# Patient Record
Sex: Male | Born: 1945 | Race: White | Hispanic: No | Marital: Married | Smoking: Former smoker
Health system: Southern US, Community
[De-identification: ages and names within clinical notes are randomized; demographics above are authoritative.]

## PROBLEM LIST (undated history)

## (undated) DIAGNOSIS — C4490 Unspecified malignant neoplasm of skin, unspecified: Secondary | ICD-10-CM

## (undated) DIAGNOSIS — Z923 Personal history of irradiation: Secondary | ICD-10-CM

## (undated) DIAGNOSIS — I2699 Other pulmonary embolism without acute cor pulmonale: Secondary | ICD-10-CM

## (undated) DIAGNOSIS — A770 Spotted fever due to Rickettsia rickettsii: Secondary | ICD-10-CM

## (undated) DIAGNOSIS — C801 Malignant (primary) neoplasm, unspecified: Secondary | ICD-10-CM

## (undated) DIAGNOSIS — C61 Malignant neoplasm of prostate: Secondary | ICD-10-CM

## (undated) DIAGNOSIS — D689 Coagulation defect, unspecified: Secondary | ICD-10-CM

## (undated) DIAGNOSIS — K219 Gastro-esophageal reflux disease without esophagitis: Secondary | ICD-10-CM

## (undated) DIAGNOSIS — H409 Unspecified glaucoma: Secondary | ICD-10-CM

## (undated) DIAGNOSIS — J45909 Unspecified asthma, uncomplicated: Secondary | ICD-10-CM

## (undated) DIAGNOSIS — J189 Pneumonia, unspecified organism: Secondary | ICD-10-CM

## (undated) HISTORY — DX: Malignant (primary) neoplasm, unspecified: C80.1

## (undated) HISTORY — DX: Personal history of irradiation: Z92.3

## (undated) HISTORY — DX: Unspecified glaucoma: H40.9

## (undated) HISTORY — DX: Gastro-esophageal reflux disease without esophagitis: K21.9

## (undated) HISTORY — DX: Coagulation defect, unspecified: D68.9

---

## 1998-10-19 HISTORY — PX: MELANOMA EXCISION: SHX5266

## 1998-12-10 ENCOUNTER — Emergency Department (HOSPITAL_COMMUNITY): Admission: EM | Admit: 1998-12-10 | Discharge: 1998-12-10 | Payer: Self-pay | Admitting: Emergency Medicine

## 1998-12-10 ENCOUNTER — Encounter: Payer: Self-pay | Admitting: *Deleted

## 2001-01-24 ENCOUNTER — Emergency Department (HOSPITAL_COMMUNITY): Admission: EM | Admit: 2001-01-24 | Discharge: 2001-01-24 | Payer: Self-pay | Admitting: Emergency Medicine

## 2002-08-07 ENCOUNTER — Ambulatory Visit (HOSPITAL_BASED_OUTPATIENT_CLINIC_OR_DEPARTMENT_OTHER): Admission: RE | Admit: 2002-08-07 | Discharge: 2002-08-07 | Payer: Self-pay | Admitting: General Surgery

## 2002-08-07 ENCOUNTER — Encounter (INDEPENDENT_AMBULATORY_CARE_PROVIDER_SITE_OTHER): Payer: Self-pay | Admitting: Specialist

## 2003-04-23 ENCOUNTER — Emergency Department (HOSPITAL_COMMUNITY): Admission: EM | Admit: 2003-04-23 | Discharge: 2003-04-23 | Payer: Self-pay | Admitting: Emergency Medicine

## 2004-02-06 ENCOUNTER — Inpatient Hospital Stay (HOSPITAL_COMMUNITY): Admission: EM | Admit: 2004-02-06 | Discharge: 2004-02-09 | Payer: Self-pay | Admitting: Emergency Medicine

## 2004-08-19 ENCOUNTER — Ambulatory Visit: Payer: Self-pay | Admitting: Family Medicine

## 2004-09-02 ENCOUNTER — Ambulatory Visit: Payer: Self-pay | Admitting: Family Medicine

## 2004-10-19 DIAGNOSIS — A770 Spotted fever due to Rickettsia rickettsii: Secondary | ICD-10-CM

## 2004-10-19 HISTORY — DX: Spotted fever due to Rickettsia rickettsii: A77.0

## 2008-08-01 ENCOUNTER — Ambulatory Visit: Admission: RE | Admit: 2008-08-01 | Discharge: 2008-09-05 | Payer: Self-pay | Admitting: Radiation Oncology

## 2008-08-02 ENCOUNTER — Encounter: Payer: Self-pay | Admitting: Internal Medicine

## 2008-10-19 HISTORY — PX: KNEE ARTHROSCOPY: SUR90

## 2009-01-28 HISTORY — PX: PROSTATE BIOPSY: SHX241

## 2009-07-22 ENCOUNTER — Encounter: Admission: RE | Admit: 2009-07-22 | Discharge: 2009-07-22 | Payer: Self-pay | Admitting: Sports Medicine

## 2009-10-19 DIAGNOSIS — C61 Malignant neoplasm of prostate: Secondary | ICD-10-CM

## 2009-10-19 HISTORY — DX: Malignant neoplasm of prostate: C61

## 2009-11-08 HISTORY — PX: PROSTATECTOMY: SHX69

## 2009-12-03 ENCOUNTER — Encounter (INDEPENDENT_AMBULATORY_CARE_PROVIDER_SITE_OTHER): Payer: Self-pay | Admitting: *Deleted

## 2010-01-16 ENCOUNTER — Encounter (INDEPENDENT_AMBULATORY_CARE_PROVIDER_SITE_OTHER): Payer: Self-pay | Admitting: *Deleted

## 2010-01-20 ENCOUNTER — Ambulatory Visit: Payer: Self-pay | Admitting: Internal Medicine

## 2010-02-03 ENCOUNTER — Ambulatory Visit: Payer: Self-pay | Admitting: Internal Medicine

## 2010-02-05 ENCOUNTER — Encounter: Payer: Self-pay | Admitting: Internal Medicine

## 2010-04-15 ENCOUNTER — Emergency Department (HOSPITAL_COMMUNITY): Admission: EM | Admit: 2010-04-15 | Discharge: 2010-04-16 | Payer: Self-pay | Admitting: Emergency Medicine

## 2010-04-25 ENCOUNTER — Ambulatory Visit: Payer: Self-pay | Admitting: Family Medicine

## 2010-04-25 DIAGNOSIS — Z8709 Personal history of other diseases of the respiratory system: Secondary | ICD-10-CM | POA: Insufficient documentation

## 2010-04-25 DIAGNOSIS — K219 Gastro-esophageal reflux disease without esophagitis: Secondary | ICD-10-CM | POA: Insufficient documentation

## 2010-04-25 DIAGNOSIS — Z86718 Personal history of other venous thrombosis and embolism: Secondary | ICD-10-CM | POA: Insufficient documentation

## 2010-05-01 ENCOUNTER — Ambulatory Visit: Payer: Self-pay | Admitting: Cardiology

## 2010-05-01 DIAGNOSIS — R0989 Other specified symptoms and signs involving the circulatory and respiratory systems: Secondary | ICD-10-CM

## 2010-05-01 DIAGNOSIS — R0609 Other forms of dyspnea: Secondary | ICD-10-CM | POA: Insufficient documentation

## 2010-06-02 ENCOUNTER — Ambulatory Visit: Payer: Self-pay

## 2010-06-02 ENCOUNTER — Ambulatory Visit: Payer: Self-pay | Admitting: Cardiology

## 2010-06-02 ENCOUNTER — Ambulatory Visit: Payer: Self-pay | Admitting: Family Medicine

## 2010-06-03 LAB — CONVERTED CEMR LAB
Cholesterol: 180 mg/dL (ref 0–200)
HDL: 40.1 mg/dL (ref >39.00)
LDL Cholesterol: 105 mg/dL — ABNORMAL HIGH (ref 0–99)
Triglycerides: 175 mg/dL — ABNORMAL HIGH (ref 0.0–149.0)
VLDL: 35 mg/dL (ref 0.0–40.0)

## 2010-07-01 ENCOUNTER — Ambulatory Visit: Payer: Self-pay | Admitting: Internal Medicine

## 2010-07-01 ENCOUNTER — Ambulatory Visit: Payer: Self-pay | Admitting: Family Medicine

## 2010-07-01 ENCOUNTER — Inpatient Hospital Stay (HOSPITAL_COMMUNITY): Admission: EM | Admit: 2010-07-01 | Discharge: 2010-07-05 | Payer: Self-pay | Admitting: Emergency Medicine

## 2010-07-01 DIAGNOSIS — R079 Chest pain, unspecified: Secondary | ICD-10-CM

## 2010-07-02 ENCOUNTER — Ambulatory Visit: Payer: Self-pay | Admitting: Vascular Surgery

## 2010-07-02 ENCOUNTER — Encounter (INDEPENDENT_AMBULATORY_CARE_PROVIDER_SITE_OTHER): Payer: Self-pay | Admitting: Internal Medicine

## 2010-07-03 ENCOUNTER — Encounter (INDEPENDENT_AMBULATORY_CARE_PROVIDER_SITE_OTHER): Payer: Self-pay | Admitting: Internal Medicine

## 2010-07-07 ENCOUNTER — Ambulatory Visit: Payer: Self-pay | Admitting: Family Medicine

## 2010-07-07 ENCOUNTER — Telehealth: Payer: Self-pay | Admitting: Family Medicine

## 2010-07-07 LAB — CONVERTED CEMR LAB: INR: 2.8

## 2010-07-14 ENCOUNTER — Ambulatory Visit: Payer: Self-pay | Admitting: Family Medicine

## 2010-07-21 ENCOUNTER — Ambulatory Visit: Payer: Self-pay | Admitting: Family Medicine

## 2010-07-21 LAB — CONVERTED CEMR LAB: INR: 1.9

## 2010-07-30 ENCOUNTER — Ambulatory Visit: Payer: Self-pay | Admitting: Family Medicine

## 2010-08-25 ENCOUNTER — Telehealth: Payer: Self-pay | Admitting: Family Medicine

## 2010-08-25 ENCOUNTER — Ambulatory Visit: Payer: Self-pay | Admitting: Family Medicine

## 2010-08-25 LAB — CONVERTED CEMR LAB: INR: 2

## 2010-08-26 ENCOUNTER — Telehealth: Payer: Self-pay | Admitting: Family Medicine

## 2010-09-24 ENCOUNTER — Ambulatory Visit: Payer: Self-pay | Admitting: Family Medicine

## 2010-09-24 LAB — CONVERTED CEMR LAB

## 2010-10-19 DIAGNOSIS — J189 Pneumonia, unspecified organism: Secondary | ICD-10-CM

## 2010-10-19 HISTORY — PX: INGUINAL HERNIA REPAIR: SUR1180

## 2010-10-19 HISTORY — DX: Pneumonia, unspecified organism: J18.9

## 2010-10-29 ENCOUNTER — Ambulatory Visit
Admission: RE | Admit: 2010-10-29 | Discharge: 2010-10-29 | Payer: Self-pay | Source: Home / Self Care | Attending: Family Medicine | Admitting: Family Medicine

## 2010-10-29 LAB — CONVERTED CEMR LAB: INR: 2.5

## 2010-11-18 NOTE — Procedures (Signed)
Summary: Colonoscopy  Patient: [name] Note: All result statuses are Final unless otherwise noted.  Tests: (1) Colonoscopy (COL)   COL Colonoscopy           DONE     [facility]     [address]     [place], [place]  [address]           COLONOSCOPY PROCEDURE REPORT           PATIENT:  Donald Villegas, Donald Villegas  MR#:  [id number]     BIRTHDATE:  1946-06-29, 64 yrs. old  GENDER:  male     ENDOSCOPIST:  Wilhemina Bonito. Eda Keys, MD     REF. BY:  Screening Recall     PROCEDURE DATE:  02/03/2010     PROCEDURE:  Colonoscopy with snare polypectomy x 2     ASA CLASS:  Class II     INDICATIONS:  Routine Risk Screening     MEDICATIONS:   Fentanyl 75 mcg IV, Versed 9 mg IV           DESCRIPTION OF PROCEDURE:   After the risks benefits and     alternatives of the procedure were thoroughly explained, informed     consent was obtained.  Digital rectal exam was performed and     revealed no abnormalities.   The LB [id number] endoscope     was introduced through the anus and advanced to the cecum, which     was identified by both the appendix and ileocecal valve, without     limitations.Time to cecum= 4:01 min. The quality of the prep was     excellent, using MoviPrep.  The instrument was then slowly     withdrawn (time = 10:15 min) as the colon was fully examined.     <<PROCEDUREIMAGES>>           FINDINGS:  Two polyps were found - 2mm in the ascending colon and     2mm in rectum.  This was otherwise a normal examination of the     colon.   Retroflexed views in the rectum revealed internal     hemorrhoids.    The scope was then withdrawn from the patient and     the procedure completed.           COMPLICATIONS:  None     ENDOSCOPIC IMPRESSION:     1) Two polyps - removed     2) Otherwise normal examination     3) Internal hemorrhoids           RECOMMENDATIONS:     1) Repeat colonoscopy in 5 years if polyp adenomatous; otherwise     10 years           ______________________________     Wilhemina Bonito. Eda Keys, MD           CC:  The Patient           n.     eSIGNED:   Wilhemina Bonito. Eda Keys at 02/03/2010 10:22 AM           Wilber Bihari, [id number]  Note: An exclamation mark (!) indicates a result that was not dispersed into the flowsheet. Document Creation Date: 02/03/2010 10:23 AM _______________________________________________________________________  (1) Order result status: Final Collection or observation date-time: 02/03/2010 10:13 Requested date-time:  Receipt date-time:  Reported date-time:  Referring Physician:   Ordering Physician: Fransico Setters [phone]) Specimen Source:  Source: Launa Grill Order Number: [id number]  Lab site:   Appended Document: Colonoscopy recall     Procedures Next Due Date:    Colonoscopy: 01/2015

## 2010-11-18 NOTE — Assessment & Plan Note (Signed)
Summary: pneumonia/dm   Vital Signs:  Patient profile:   65 year old male Weight:      185 pounds O2 Sat:      93 % on Room air Temp:     99.4 degrees F oral Pulse rate:   70 / minute BP sitting:   132 / 88  (left arm) Cuff size:   regular  Vitals Entered By: Kern Reap CMA Duncan Dull) (July 01, 2010 4:43 PM)  O2 Flow:  Room air CC: possible pneumonia Is Patient Diabetic? No Pain Assessment Patient in pain? yes        Primary Care Provider:  Dr. Tawanna Cooler  CC:  possible pneumonia.  History of Present Illness: Donald Villegas  is a 65 year old male, who comes in today withfor evaluation of acute right-sided chest pain.  He states he felt fine until 1 p.m. today when he developed the sudden onset of right-sided chest pain.  He points to the right upper ribs as a source of his pain and he says it radiates up into his scapula.  He ate at 1030.  No history of any gallbladder problems in the past.  Is not having vomiting, diarrhea, et Karie Soda.  He had a history of pneumonia and had a slowly resolving.  Chest x-ray last summer.  He feels his symptoms are consistent with a pneumonia.  He had last summer  Allergies: No Known Drug Allergies  Past History:  Past medical, surgical, family and social histories (including risk factors) reviewed for relevance to current acute and chronic problems.  Past Medical History: Reviewed history from 05/01/2010 and no changes required. 1.  Pneumonia 6/11 2.  Pulmonary embolism: 2005, had prior left leg deep laceration, ? resulting in DVT.  Was on coumadin for a period, now off anticoagulation.  3.  melanoma, hx of 4.  GERD 5.  Prostate Cancer: Had Da Vinci surgery in 1/11.  6.  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  Scattered coronary calcifications on CTA chest from 6/11.   Past Surgical History: Reviewed history from 04/25/2010 and no changes required. Tonsillectomy prostate surgery left knee surgery  Family  History: Reviewed history from 05/01/2010 and no changes required. Father: deceased - staph infection Mother: deceased - ALS Siblings: 1 brother deceased - cancer Children: 2 - healthy No premature CAD.   Social History: Reviewed history from 05/01/2010 and no changes required. Occupation: [employer] Married, lives in [place].  Alcohol use-yes Drug use-no Nonsmoker  Review of Systems      See HPI  Physical Exam  General:  Well-developed,well-nourished,in no acute distress; alert,appropriate and cooperative throughout examination Neck:  No deformities, masses, or tenderness noted. Chest Wall:  No deformities, masses, tenderness or gynecomastia noted. Lungs:  crackles right base Heart:  Normal rate and regular rhythm. S1 and S2 normal without gallop, murmur, click, rub or other extra sounds. Abdomen:  Bowel sounds positive,abdomen soft and non-tender without masses, organomegaly or hernias noted.   Problems:  Medical Problems Added: 1)  Dx of Chest Pain  (ICD-786.50)  Impression & Recommendations:  Problem # 1:  CHEST PAIN (ICD-786.50) Assessment New  Complete Medication List: 1)  Prednisone 5 Mg/91ml Soln (Prednisone) .... Use as directed 2)  Aspirin 81 Mg Tbec (Aspirin) .... About 3 x a week  Patient Instructions: 1)  go directly to [facility] emergency room for evaluation

## 2010-11-18 NOTE — Letter (Signed)
Summary: Patient Notice- Polyp Results  [facility]  [address] [place], [place] [address]   Phone: [phone]  Fax: [phone]        February 05, 2010 MRN: [id number]    CIPRIANO MILLIKAN [address] [place], [place]  [address]    Dear Mr. FERRENTINO,  I am pleased to inform you that the colon polyp(s) removed during your recent colonoscopy was (were) found to be benign (no cancer detected) upon pathologic examination.  I recommend you have a repeat colonoscopy examination in 5 years to look for recurrent polyps, as having colon polyps increases your risk for having recurrent polyps or even colon cancer in the future.  Should you develop new or worsening symptoms of abdominal pain, bowel habit changes or bleeding from the rectum or bowels, please schedule an evaluation with either your primary care physician or with me.  Additional information/recommendations:  __ No further action with gastroenterology is needed at this time. Please      follow-up with your primary care physician for your other healthcare      needs.  Please call us if you are having persistent problems or have questions about your condition that have not been fully answered at this time.  Sincerely,  Hilarie Fredrickson MD  This letter has been electronically signed by your physician.  Appended Document: Patient Notice- Polyp Results letter mailed 4.25.11

## 2010-11-18 NOTE — Miscellaneous (Signed)
Summary: Previsit  Clinical Lists Changes  Medications: Added new medication of MOVIPREP 100 GM  SOLR (PEG-KCL-NACL-NASULF-NA ASC-C) As directed - Signed Rx of MOVIPREP 100 GM  SOLR (PEG-KCL-NACL-NASULF-NA ASC-C) As directed;  #1 x 0;  Signed;  Entered by: Clide Cliff RN;  Authorized by: Hilarie Fredrickson MD;  Method used: [facility] [address] [id number]*, [address], [place], [place], [place]  [address], Ph: [phone] or [phone], Fax: [phone] Observations: Added new observation of ALLERGY REV: Done (01/20/2010 8:29)    Prescriptions: MOVIPREP 100 GM  SOLR (PEG-KCL-NACL-NASULF-NA ASC-C) As directed  #1 x 0   Entered by:   Clide Cliff RN   Authorized by:   Hilarie Fredrickson MD   Signed by:   Clide Cliff RN on 01/20/2010   Method used:   [facility]        [facility] [address] [phone]* (retail)       [address]       [place], [place]  [address]       Ph: [phone] or [phone]       Fax: [phone]   RxID:   [id number]

## 2010-11-18 NOTE — Assessment & Plan Note (Signed)
Summary: PT//SLM  Nurse Visit   Allergies: No Known Drug Allergies Laboratory Results   Blood Tests      INR: 2.0   (Normal Range: 0.88-1.12   Therap INR: 2.0-3.5) Comments: Donald Villegas  August 25, 2010 2:33 PM     Orders Added: 1)  Est. Patient Level I [99211] 2)  Protime [62952WU]   ANTICOAGULATION RECORD PREVIOUS REGIMEN & LAB RESULTS   Previous INR:  2.1 on  07/30/2010  Previous Regimen:  same on  07/07/2010  NEW REGIMEN & LAB RESULTS Current INR: 2.0 Regimen: same  Repeat testing in: 4 weeks  Anticoagulation Visit Questionnaire Coumadin dose missed/changed:  No Abnormal Bleeding Symptoms:  No  Any diet changes including alcohol intake, vegetables or greens since the last visit:  No Any illnesses or hospitalizations since the last visit:  No Any signs of clotting since the last visit (including chest discomfort, dizziness, shortness of breath, arm tingling, slurred speech, swelling or redness in leg):  No  MEDICATIONS COUMADIN 5 MG TABS (WARFARIN SODIUM) take one tab by mouth once daily

## 2010-11-18 NOTE — Assessment & Plan Note (Signed)
Summary: FOLLOW UP AND PT/CJR   Vital Signs:  Patient profile:   65 year old male Weight:      186 pounds Temp:     98.2 degrees F oral Pulse rate:   74 / minute Pulse rhythm:   regular BP sitting:   132 / 82  Vitals Entered By: Lynann Beaver CMA (July 30, 2010 10:28 AM) CC: rov with PT Is Patient Diabetic? No Pain Assessment Patient in pain? no        CC:  rov with PT.  Current Medications (verified): 1)  Coumadin 5 Mg Tabs (Warfarin Sodium) .... Take One Tab By Mouth Once Daily  Allergies (verified): No Known Drug Allergies   Complete Medication List: 1)  Coumadin 5 Mg Tabs (Warfarin sodium) .... Take one tab by mouth once daily  Other Orders: Protime (16109UE)   ANTICOAGULATION RECORD PREVIOUS REGIMEN & LAB RESULTS   Previous INR:  1.9 on  07/21/2010  Previous Regimen:  same on  07/07/2010  NEW REGIMEN & LAB RESULTS Current INR: 2.1 Regimen: same  (no change)   Anticoagulation Visit Questionnaire Coumadin dose missed/changed:  No Abnormal Bleeding Symptoms:  No  Any diet changes including alcohol intake, vegetables or greens since the last visit:  No Any illnesses or hospitalizations since the last visit:  No Any signs of clotting since the last visit (including chest discomfort, dizziness, shortness of breath, arm tingling, slurred speech, swelling or redness in leg):  No  MEDICATIONS COUMADIN 5 MG TABS (WARFARIN SODIUM) take one tab by mouth once daily    Laboratory Results   Blood Tests      INR: 2.1   (Normal Range: 0.88-1.12   Therap INR: 2.0-3.5) Comments: Rita Ohara  July 30, 2010 10:25 AM

## 2010-11-18 NOTE — Progress Notes (Signed)
Summary: switched pharm  Phone Note Call from Patient   Caller: Mom Call For: Roderick Pee MD Summary of Call: Pt would coumadin 5mg  [facility] Initial call taken by: Heron Sabins,  August 26, 2010 12:32 PM  Follow-up for Phone Call        already done  Follow-up by: Kern Reap CMA Duncan Dull),  August 26, 2010 12:44 PM

## 2010-11-18 NOTE — Assessment & Plan Note (Signed)
Summary: np6/coronary calcifications on CT scan for pneumonia   Visit Type:  Initial Consult Primary Provider:  Dr. Tawanna Cooler  CC:  Coronary calcifications on CT scan.  History of Present Illness: 65 yo with history of PE in 2005 and recent pneumonia presents for evaluation of cardiac risk.  Patient was noted to have scattered coronary artery calcifications on CTA chest done in 6/11.  Patient went to the ER 6/29 because of severe pleuritic left-sided chest pain reminiscent of prior PE.  He was also feeling exhausted/fatigued.  In the ER, CTA chest was done that did not show a PE.  However, there did appear to be a right base PNA.  There were scattered coronary calcifications noted.  Patient was treated with antibiotics and gradually over time symptoms resolved.  Interestingly, he never had a fever or much of a cough.    At baseline, patient is quite active.  He plays doubles tennis, walks, rides a bike, mows his grass.  He denies chest pain or exertional dyspnea.    ECG: NSR, borderline LV hypertrophy.   Current Medications (verified): 1)  Prednisone 5 Mg/21ml Soln (Prednisone) .... Use As Directed 2)  Aspirin 81 Mg Tbec (Aspirin) .... About 3 X A Week  Allergies (verified): No Known Drug Allergies  Past History:  Past Medical History: 1.  Pneumonia 6/11 2.  Pulmonary embolism: 2005, had prior left leg deep laceration, ? resulting in DVT.  Was on coumadin for a period, now off anticoagulation.  3.  melanoma, hx of 4.  GERD 5.  Prostate Cancer: Had Da Vinci surgery in 1/11.  6.  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  Scattered coronary calcifications on CTA chest from 6/11.   Family History: Reviewed history from 04/25/2010 and no changes required. Father: deceased - staph infection Mother: deceased - ALS Siblings: 1 brother deceased - cancer Children: 2 - healthy No premature CAD.   Social History: Occupation: Secondary school teacher Married, lives in  [place].  Alcohol use-yes Drug use-no Nonsmoker  Review of Systems       All systems reviewed and negative except as per HPI.   Vital Signs:  Patient profile:   65 year old male Height:      69 inches Weight:      185.50 pounds BMI:     27.49 Pulse rate:   68 / minute Pulse rhythm:   regular Resp:     18 per minute BP sitting:   124 / 82  (left arm) Cuff size:   large  Vitals Entered By: Vikki Ports (May 01, 2010 3:05 PM)  Physical Exam  General:  Well developed, well nourished, in no acute distress. Head:  normocephalic and atraumatic Nose:  no deformity, discharge, inflammation, or lesions Mouth:  Teeth, gums and palate normal. Oral mucosa normal. Neck:  Neck supple, no JVD. No masses, thyromegaly or abnormal cervical nodes. Lungs:  Clear bilaterally to auscultation and percussion. Heart:  Non-displaced PMI, chest non-tender; regular rate and rhythm, S1, S2 without murmurs, rubs or gallops. Carotid upstroke normal, no bruit.  Pedals normal pulses. No edema, no varicosities. Abdomen:  Bowel sounds positive; abdomen soft and non-tender without masses, organomegaly, or hernias noted. No hepatosplenomegaly. Msk:  Back normal, normal gait. Muscle strength and tone normal. Extremities:  No clubbing or cyanosis. Neurologic:  Alert and oriented x 3. Skin:  Intact without lesions or rashes. Psych:  Normal affect.   Impression & Recommendations:  Problem # 1:  CARDIAC RISK Patient was noted to have scattered coronary calcification on recent CTA chest to assess for PE.  The presence of coronary calcification is diagnostic of coronary atherosclerosis. Patient does not have any significant cardiopulmonary symptoms.  He does not have any other cardiac risk factors besides age and   I will set him up for an ETT to risk stratify.  I will also check his lipids with a low threshold to treat with a statin.  He will continue ASA 81 mg daily.    Other Orders: Treadmill  (Treadmill)  Patient Instructions: 1)  Your physician recommends that you return for a FASTING lipid profile--768.09 2)  Your physician has requested that you have an exercise tolerance test.  For further information please visit [email or web address]  Please also follow instruction sheet, as given.

## 2010-11-18 NOTE — Progress Notes (Signed)
Summary: Pro Time Lab to be done today  Phone Note Call from Patient Call back at [phone] cell   Caller: Patient Summary of Call: Pt called and said that he has been discharged from [facility]. Pt said that he needs to come in for some lab work. Pls advise.    Initial call taken by: Lucy Antigua,  July 07, 2010 8:31 AM  Follow-up for Phone Call        please call.......... asked him to come in sometime between 4 and 5.  This afternoon for a ProTime Follow-up by: Roderick Pee MD,  July 07, 2010 10:13 AM  Additional Follow-up for Phone Call Additional follow up Details #1::        Lft msg on pt answering machine to come in today to have Pro Time done Additional Follow-up by: Kathrynn Speed CMA,  July 07, 2010 10:40 AM

## 2010-11-18 NOTE — Letter (Signed)
Summary: Colonoscopy Letter  [facility]  [address] [place], [place] [address]   Phone: [phone]  Fax: [phone]      December 03, 2009 MRN: [id number]   AL Pfund [address] [place], [place]  [address]   Dear Mr. STASH,   According to your medical record, it is time for you to schedule a Colonoscopy. The American Cancer Society recommends this procedure as a method to detect early colon cancer. Patients with a family history of colon cancer, or a personal history of colon polyps or inflammatory bowel disease are at increased risk.  This letter has beeen generated based on the recommendations made at the time of your procedure. If you feel that in your particular situation this may no longer apply, please contact our office.  Please call our office at [phone] to schedule this appointment or to update your records at your earliest convenience.  Thank you for cooperating with [place] to provide you with the very best care possible.   Sincerely,  Wilhemina Bonito. Marina Goodell, M.D.  [facility] [phone]

## 2010-11-18 NOTE — Assessment & Plan Note (Signed)
Summary: 1 wk rov/protime/njr   Vital Signs:  Patient profile:   65 year old male Weight:      189 pounds Temp:     98.4 degrees F oral BP sitting:   110 / 80  (left arm) Cuff size:   regular  Vitals Entered By: Kern Reap CMA Duncan Dull) (July 21, 2010 9:21 AM) CC: follow-up visit   CC:  follow-up visit.  Allergies: No Known Drug Allergies   Complete Medication List: 1)  Coumadin 5 Mg Tabs (Warfarin sodium) .... Take one tab by mouth once daily  Other Orders: Protime ([id number]) Fingerstick ([id number])  Patient Instructions: 1)  take 5 mg of Coumadin Monday through Friday, 2.5 mg Saturday and Sunday.  Return next week for a follow-up protime   ANTICOAGULATION RECORD PREVIOUS REGIMEN & LAB RESULTS   Previous INR:  4.3 on  07/14/2010  Previous Regimen:  same on  07/07/2010  NEW REGIMEN & LAB RESULTS Current INR: 1.9 Regimen: same  (no change)   Anticoagulation Visit Questionnaire Coumadin dose missed/changed:  No Abnormal Bleeding Symptoms:  No  Any diet changes including alcohol intake, vegetables or greens since the last visit:  No Any illnesses or hospitalizations since the last visit:  No Any signs of clotting since the last visit (including chest discomfort, dizziness, shortness of breath, arm tingling, slurred speech, swelling or redness in leg):  No  MEDICATIONS COUMADIN 5 MG TABS (WARFARIN SODIUM) take one tab by mouth once daily    Laboratory Results   Blood Tests      INR: 1.9   (Normal Range: 0.88-1.12   Therap INR: 2.0-3.5) Comments: Rita Ohara  July 21, 2010 9:16 AM       Appended Document: 1 wk rov/protime/njr     Allergies: No Known Drug Allergies   Complete Medication List: 1)  Coumadin 5 Mg Tabs (Warfarin sodium) .... Take one tab by mouth once daily  Other Orders: Influenza Vaccine MCR ([id number])    Immunizations Administered:  Influenza Vaccine # 1:    Vaccine Type: Fluvax MCR    Site: right deltoid    Mfr:  GlaxoSmithKline    Dose: 0.5 ml    Route: IM    Given by: Kern Reap CMA (AAMA)    Exp. Date: 04/18/2011    Lot #: [id number]    VIS given: 05/13/10 version given July 21, 2010.    Physician counseled: yes

## 2010-11-18 NOTE — Letter (Signed)
Summary: [facility] Instructions  [facility]  [address] [place], [place] [address]   Phone: [phone]  Fax: [phone]       Donald Villegas    10/13/1946    MRN: [id number]        Procedure Day /Date:  Monday  02/03/10     Arrival Time:  8:30 am         Procedure Time:  9:30 am     Location of Procedure:                    _x_  [facility] (4th Floor)                        PREPARATION FOR COLONOSCOPY WITH MOVIPREP   Starting 5 days prior to your procedure _4/13/11 _ do not eat nuts, seeds, popcorn, corn, beans, peas,  salads, or any raw vegetables.  Do not take any fiber supplements (e.g. Metamucil, Citrucel, and Benefiber).  THE DAY BEFORE YOUR PROCEDURE         DATE:  02/02/10  DAY:  Sunday  1.  Drink clear liquids the entire day-NO SOLID FOOD  2.  Do not drink anything colored red or purple.  Avoid juices with pulp.  No orange juice.  3.  Drink at least 64 oz. (8 glasses) of fluid/clear liquids during the day to prevent dehydration and help the prep work efficiently.  CLEAR LIQUIDS INCLUDE: Water Jello Ice Popsicles Tea (sugar ok, no milk/cream) Powdered fruit flavored drinks Coffee (sugar ok, no milk/cream) Gatorade Juice: apple, white grape, white cranberry  Lemonade Clear bullion, consomm, broth Carbonated beverages (any kind) Strained chicken noodle soup Hard Candy                             4.  In the morning, mix first dose of MoviPrep solution:    Empty 1 Pouch A and 1 Pouch B into the disposable container    Add lukewarm drinking water to the top line of the container. Mix to dissolve    Refrigerate (mixed solution should be used within 24 hrs)  5.  Begin drinking the prep at 5:00 p.m. The MoviPrep container is divided by 4 marks.   Every 15 minutes drink the solution down to the next mark (approximately 8 oz) until the full liter is complete.   6.  Follow completed prep with 16 oz of clear liquid of your choice  (Nothing red or purple).  Continue to drink clear liquids until bedtime.  7.  Before going to bed, mix second dose of MoviPrep solution:    Empty 1 Pouch A and 1 Pouch B into the disposable container    Add lukewarm drinking water to the top line of the container. Mix to dissolve    Refrigerate  THE DAY OF YOUR PROCEDURE      DATE:   02/03/10   DAY:   Monday  Beginning at  4:30 a.m. (5 hours before procedure):         1. Every 15 minutes, drink the solution down to the next mark (approx 8 oz) until the full liter is complete.  2. Follow completed prep with 16 oz. of clear liquid of your choice.    3. You may drink clear liquids until _ 7:30am _ (2 HOURS BEFORE PROCEDURE).   MEDICATION INSTRUCTIONS  Unless otherwise instructed, you should take  regular prescription medications with a small sip of water   as early as possible the morning of your procedure.           OTHER INSTRUCTIONS  You will need a responsible adult at least 67 years of age to accompany you and drive you home.   This person must remain in the waiting room during your procedure.  Wear loose fitting clothing that is easily removed.  Leave jewelry and other valuables at home.  However, you may wish to bring a book to read or  an iPod/MP3 player to listen to music as you wait for your procedure to start.  Remove all body piercing jewelry and leave at home.  Total time from sign-in until discharge is approximately 2-3 hours.  You should go home directly after your procedure and rest.  You can resume normal activities the  day after your procedure.  The day of your procedure you should not:   Drive   Make legal decisions   Operate machinery   Drink alcohol   Return to work  You will receive specific instructions about eating, activities and medications before you leave.    The above instructions have been reviewed and explained to me by   ____Suzanne Yetta Flock, RN___________________    I  fully understand and can verbalize these instructions _____________________________ Date _________

## 2010-11-18 NOTE — Assessment & Plan Note (Signed)
Summary: new acute-only to address ? pnueumonia//ccm   Vital Signs:  Patient profile:   65 year old male Height:      69 inches Weight:      188 pounds BMI:     27.86 Temp:     98.1 degrees F oral Pulse rate:   64 / minute BP sitting:   120 / 80  (left arm) Cuff size:   regular  Vitals Entered By: Kern Reap CMA Duncan Dull) (April 25, 2010 9:26 AM) CC: new to establish, follow up pnuemonia Is Patient Diabetic? No Pain Assessment Patient in pain? no       Does patient need assistance? Ambulation Normal   CC:  new to establish and follow up pnuemonia.  History of Present Illness: Donald Villegas is a 65 year old, married male, nonsmoker, who comes in today for follow-up of pneumonia.  He was seen in the emergency room about two weeks ago on Monday for evaluation of severe right-sided chest pain.  He did have a chest x-ray.  They did a CT scan because he has a history of a pulmonary emboli.  CT scan showed pneumonia.  He was given Rocephin and Zithromax.  He took the Zithromax x 5 days didn't feel a lot better.  Therefore, his dentist, Dr. Julious Oka gave him a refill the Zithromax for 5 more days.  In the last couple days.  He feels pretty much back to normal.  No fever, chills, cough, or sputum production, but still some soreness on the right side.  Review of CT scan also shows some coronary calcifications.  Cardiac,-wise he's been asymptomatic.  Review of systems otherwise negative  Preventive Screening-Counseling & Management  Alcohol-Tobacco     Year Quit: 1981  Hep-HIV-STD-Contraception     Dental Visit-last 6 months yes      Drug Use:  no.    Allergies (verified): No Known Drug Allergies  Past History:  Past medical, surgical, family and social histories (including risk factors) reviewed, and no changes noted (except as noted below).  Past Medical History: Pneumonia, hx of Pulmonary embolism, hx of melanoma, hx of GERD  Past Surgical History: Tonsillectomy prostate  surgery left knee surgery  Family History: Reviewed history and no changes required. Father: deceased - staph infection Mother: deceased - ALS Siblings: 1 brother deceased - cancer Children: 2 - healthy  Social History: Reviewed history and no changes required. Occupation:[employer]'s [employer] Married Alcohol use-yes Drug use-no Drug Use:  no Dental Care w/in 6 mos.:  yes  Review of Systems      See HPI  Physical Exam  General:  Well-developed,well-nourished,in no acute distress; alert,appropriate and cooperative throughout examination Head:  Normocephalic and atraumatic without obvious abnormalities. No apparent alopecia or balding. Eyes:  No corneal or conjunctival inflammation noted. EOMI. Perrla. Funduscopic exam benign, without hemorrhages, exudates or papilledema. Vision grossly normal. Ears:  External ear exam shows no significant lesions or deformities.  Otoscopic examination reveals clear canals, tympanic membranes are intact bilaterally without bulging, retraction, inflammation or discharge. Hearing is grossly normal bilaterally. Nose:  External nasal examination shows no deformity or inflammation. Nasal mucosa are pink and moist without lesions or exudates. Mouth:  Oral mucosa and oropharynx without lesions or exudates.  Teeth in good repair. Neck:  No deformities, masses, or tenderness noted. Chest Wall:  No deformities, masses, tenderness or gynecomastia noted. Breasts:  No masses or gynecomastia noted Lungs:  crackles right base   Impression & Recommendations:  Problem # 1:  PNEUMONIA, HX OF (  ICD-V12.60) Assessment Improved  Orders: T-2 View CXR (71020TC)  Complete Medication List: 1)  Prednisone 5 Mg/80ml Soln (Prednisone) .... Use as directed  Other Orders: Cardiology Referral (Cardiology)  Patient Instructions: 1)  get a follow-up chest x-ray in 6 weeks. 2)  Because of the coronary calcifications.  We will get you set up for a cardiac consult with Dr.  Freida Busman.

## 2010-11-18 NOTE — Progress Notes (Signed)
Summary: med refill  Phone Note Refill Request Message from:  Patient on [facility] [phone]  Refills Requested: Medication #1:  COUMADIN 5 MG TABS take one tab by mouth once daily. Initial call taken by: Drue Stager,  August 25, 2010 2:38 PM    Prescriptions: COUMADIN 5 MG TABS (WARFARIN SODIUM) take one tab by mouth once daily  #90 x 1   Entered by:   Kern Reap CMA (AAMA)   Authorized by:   Roderick Pee MD   Signed by:   Kern Reap CMA (AAMA) on 08/26/2010   Method used:   Electronically to        [facility] [address] [phone]* (retail)       [address]       [place], [place]  [address]       Ph: [phone] or [phone]       Fax: [phone]   RxID:   [id number]

## 2010-11-18 NOTE — Assessment & Plan Note (Signed)
Summary: PT   pt rsc/njr  Nurse Visit   Allergies: No Known Drug Allergies Laboratory Results   Blood Tests      INR: 1.4   (Normal Range: 0.88-1.12   Therap INR: 2.0-3.5) Comments: Rita Ohara  September 24, 2010 3:53 PM        ANTICOAGULATION RECORD PREVIOUS REGIMEN & LAB RESULTS   Previous INR:  2.0 on  08/25/2010  Previous Regimen:  same on  08/25/2010  NEW REGIMEN & LAB RESULTS Anticoag. Dx: PE Current INR Goal Range: 2.0-3.0 Current INR: 1.4 Regimen: 5mg . Mon.- Fri. 2.5mg . Sat. & Sun.  Repeat testing in: 4 weeks  Anticoagulation Visit Questionnaire Coumadin dose missed/changed:  No Abnormal Bleeding Symptoms:  No  Any diet changes including alcohol intake, vegetables or greens since the last visit:  No Any illnesses or hospitalizations since the last visit:  No Any signs of clotting since the last visit (including chest discomfort, dizziness, shortness of breath, arm tingling, slurred speech, swelling or redness in leg):  No  MEDICATIONS COUMADIN 5 MG TABS (WARFARIN SODIUM) take one tab by mouth once daily

## 2010-11-18 NOTE — Assessment & Plan Note (Signed)
Summary: fu per pt/aLSO PT/NJR   Vital Signs:  Patient profile:   65 year old male Weight:      185 pounds Temp:     98.5 degrees F oral BP sitting:   120 / 84  (left arm) Cuff size:   regular  Vitals Entered By: Kern Reap CMA Duncan Dull) (July 14, 2010 10:16 AM) CC: follow-up visit   CC:  follow-up visit.  Allergies (verified): No Known Drug Allergies   Complete Medication List: 1)  Coumadin 5 Mg Tabs (Warfarin sodium) .... Take one tab by mouth once daily  Other Orders: Protime ([id number]) Fingerstick (32440)  Patient Instructions: 1)  take 5 mg of Coumadin Monday, Wednesday, Friday, 2.5 mg the other days.  Return next Monday for a follow-up protime  Laboratory Results   Blood Tests      INR: 4.3   (Normal Range: 0.88-1.12   Therap INR: 2.0-3.5) Comments: Rita Ohara  July 14, 2010 10:13 AM       ANTICOAGULATION RECORD PREVIOUS REGIMEN & LAB RESULTS   Previous INR:  2.8 on  07/07/2010  Previous Regimen:  same on  07/07/2010  NEW REGIMEN & LAB RESULTS Current INR: 4.3 Regimen: same  (no change)   Anticoagulation Visit Questionnaire Coumadin dose missed/changed:  No Abnormal Bleeding Symptoms:  No  Any diet changes including alcohol intake, vegetables or greens since the last visit:  No Any illnesses or hospitalizations since the last visit:  No Any signs of clotting since the last visit (including chest discomfort, dizziness, shortness of breath, arm tingling, slurred speech, swelling or redness in leg):  Yes  MEDICATIONS COUMADIN 5 MG TABS (WARFARIN SODIUM) take one tab by mouth once daily

## 2010-11-18 NOTE — Assessment & Plan Note (Signed)
Summary: PT/RCD  Nurse Visit   Allergies: No Known Drug Allergies Laboratory Results   Blood Tests      INR: 2.8   (Normal Range: 0.88-1.12   Therap INR: 2.0-3.5) Comments: Donald Villegas  July 07, 2010 4:38 PM     Orders Added: 1)  Est. Patient Level I [52841] 2)  Protime [[id number]]   ANTICOAGULATION RECORD  NEW REGIMEN & LAB RESULTS Current INR: 2.8 Regimen: same  Repeat testing in: 1 week  Anticoagulation Visit Questionnaire Coumadin dose missed/changed:  No Abnormal Bleeding Symptoms:  No  Any diet changes including alcohol intake, vegetables or greens since the last visit:  No Any illnesses or hospitalizations since the last visit:  Yes Any signs of clotting since the last visit (including chest discomfort, dizziness, shortness of breath, arm tingling, slurred speech, swelling or redness in leg):  No  MEDICATIONS PREDNISONE 5 MG/5ML SOLN (PREDNISONE) use as directed ASPIRIN 81 MG TBEC (ASPIRIN) about 3 x a week

## 2010-11-20 NOTE — Assessment & Plan Note (Signed)
Summary: fup on coumedin/cjr   Vital Signs:  Patient profile:   65 year old male BP sitting:   150 / 90  (left arm) Cuff size:   regular  Vitals Entered By: Kern Reap CMA Duncan Dull) (October 29, 2010 10:12 AM) CC: follow-up visit PT   Primary Care Provider:  Dr. Tawanna Cooler  CC:  follow-up visit PT.  History of Present Illness: Donald Villegas is a 65 year old, married male, nonsmoker, who comes in today for follow-up of pulmonary emboli.  As previously noted he had a pulmonary emboli that 7 years ago.  Workup showed no evidence of any clotting abnormality and then this past fall.  Had another episode.  It's now recommended that he take Coumadin for life.  He would also like to get a second opinion from one of the hematologist at [facility].  He is also going to have hernia surgery.  This spring by Dr. Jamey Ripa   Allergies: No Known Drug Allergies  Past History:  Past medical, surgical, family and social histories (including risk factors) reviewed for relevance to current acute and chronic problems.  Past Medical History: Reviewed history from 05/01/2010 and no changes required. 1.  Pneumonia 6/11 2.  Pulmonary embolism: 2005, had prior left leg deep laceration, ? resulting in DVT.  Was on coumadin for a period, now off anticoagulation.  3.  melanoma, hx of 4.  GERD 5.  Prostate Cancer: Had Da Vinci surgery in 1/11.  6.  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  Scattered coronary calcifications on CTA chest from 6/11.   Past Surgical History: Reviewed history from 04/25/2010 and no changes required. Tonsillectomy prostate surgery left knee surgery  Family History: Reviewed history from 05/01/2010 and no changes required. Father: deceased - staph infection Mother: deceased - ALS Siblings: 1 brother deceased - cancer Children: 2 - healthy No premature CAD.   Social History: Reviewed history from 05/01/2010 and no changes required. Occupation: [employer] Married, lives in [place].  Alcohol use-yes Drug use-no Nonsmoker  Review of Systems      See HPI  Physical Exam  General:  Well-developed,well-nourished,in no acute distress; alert,appropriate and cooperative throughout examination   Impression & Recommendations:  Problem # 1:  COUMADIN THERAPY (ICD-V58.61) Assessment Unchanged  Orders: Protime ([id number]) Fingerstick (46962)  Complete Medication List: 1)  Coumadin 5 Mg Tabs (Warfarin sodium) .... Take one tab by mouth once daily  Patient Instructions: 1)  continue the current dose.  Follow-up protime in one month   Orders Added: 1)  Protime [[id number]] 2)  Fingerstick [[id number]]    Laboratory Results   Blood Tests   Date/Time Recieved: October 29, 2010 10:02 AM  Date/Time Reported: October 29, 2010 10:02 AM    INR: 2.5   (Normal Range: 0.88-1.12   Therap INR: 2.0-3.5) Comments: Wynona Canes, CMA  October 29, 2010 10:02 AM       ANTICOAGULATION RECORD PREVIOUS REGIMEN & LAB RESULTS Anticoagulation Diagnosis:  PE on  09/24/2010 Previous INR Goal Range:  2.0-3.0 on  09/24/2010 Previous INR:  1.4 on  09/24/2010  Previous Regimen:  5mg . Mon.- Fri. 2.5mg . Sat. & Sun. on  09/24/2010  NEW REGIMEN & LAB RESULTS Anticoag. Dx: V58.83,V58.61,415.19 Current INR: 2.5 Current Coumadin Dose(mg): 2.5mg  on sat & sun 5mg  on other days Regimen: 5mg . Mon.- Fri. 2.5mg . Sat. & Sun.  (no change)       Repeat testing in: OV by Dr. MEDICATIONS COUMADIN 5 MG TABS (WARFARIN SODIUM)  take one tab by mouth once daily

## 2010-11-21 ENCOUNTER — Encounter: Payer: Self-pay | Admitting: Family Medicine

## 2010-11-21 ENCOUNTER — Telehealth: Payer: Self-pay | Admitting: Family Medicine

## 2010-11-21 DIAGNOSIS — Z86711 Personal history of pulmonary embolism: Secondary | ICD-10-CM

## 2010-11-21 NOTE — Telephone Encounter (Signed)
[facility] called and said that Rt Inguinal Hernia repair is sch at [facility] for 12/05/10 at 1pm with Dr. Jamey Ripa.    Just wanted to make Dr. Tawanna Cooler aware.   [phone] Ask for Kendal Hymen if there are any questions. Coordinate Levonox bridge 5 days preop.

## 2010-11-21 NOTE — Telephone Encounter (Signed)
Pt is req a call back from Dr Barbette Or nurse to discuss Lovenox. Pls call back asap.

## 2010-11-21 NOTE — Telephone Encounter (Signed)
R.    Ok  to call and Lovenox..........dose is 30 mg twice daily, dispensed 20.  He is to start this 3 days after he stops his Coumadin.  Dr. Jamey Ripa will then tell him when to restart his Coumadin

## 2010-11-21 NOTE — Telephone Encounter (Signed)
>>>>  Pt will need a Rx for lovenox.<<<<  Already faxed surgical clearance to [facility] at  [phone]

## 2010-11-24 MED ORDER — ENOXAPARIN SODIUM 30 MG/0.3ML ~~LOC~~ SOLN: 30.0000 mg | Freq: Two times a day (BID) | SUBCUTANEOUS | Status: DC

## 2010-11-24 NOTE — Telephone Encounter (Signed)
Spoke with patient and rx sent 

## 2010-11-26 NOTE — Letter (Signed)
Summary: Generic Letter  [place] [facility]  [address] [place], [place] [address]   Phone: [phone]  Fax: [phone]    11/21/2010  Jahmeer Counts [address] [place], [place]  [address]  [facility],  Our patient Donald Villegas has been cleared for surgery.  If you have any questions or concerns please feel free to call our office.          Sincerely,    Kelle Darting, MD

## 2010-12-03 ENCOUNTER — Encounter (HOSPITAL_COMMUNITY)
Admission: RE | Admit: 2010-12-03 | Discharge: 2010-12-03 | Disposition: A | Discharge status: Home / Self Care | Payer: Medicare Other | Source: Ambulatory Visit | Attending: Surgery | Admitting: Surgery

## 2010-12-03 DIAGNOSIS — Z01812 Encounter for preprocedural laboratory examination: Secondary | ICD-10-CM | POA: Insufficient documentation

## 2010-12-03 LAB — DIFFERENTIAL
Basophils Absolute: 0.1 10*3/uL (ref 0.0–0.1)
Basophils Relative: 1 % (ref 0–1)
Monocytes Absolute: 0.8 10*3/uL (ref 0.1–1.0)
Neutro Abs: 4.8 10*3/uL (ref 1.7–7.7)

## 2010-12-03 LAB — CBC
HCT: 45.9 % (ref 39.0–52.0)
Hemoglobin: 15.6 g/dL (ref 13.0–17.0)
MCHC: 34 g/dL (ref 30.0–36.0)
MCV: 96 fL (ref 78.0–100.0)
Platelets: 187 10*3/uL (ref 150–400)
RBC: 4.78 MIL/uL (ref 4.22–5.81)
WBC: 8.5 10*3/uL (ref 4.0–10.5)

## 2010-12-03 LAB — BASIC METABOLIC PANEL
BUN: 25 mg/dL — ABNORMAL HIGH (ref 6–23)
Calcium: 9.3 mg/dL (ref 8.4–10.5)
Chloride: 106 mEq/L (ref 96–112)
Creatinine, Ser: 1.07 mg/dL (ref 0.4–1.5)
Sodium: 140 mEq/L (ref 135–145)

## 2010-12-03 LAB — APTT: aPTT: 32 seconds (ref 24–37)

## 2010-12-03 LAB — PROTIME-INR
INR: 1.94 — ABNORMAL HIGH (ref 0.00–1.49)
Prothrombin Time: 22.3 seconds — ABNORMAL HIGH (ref 11.6–15.2)

## 2010-12-05 ENCOUNTER — Other Ambulatory Visit: Payer: Self-pay | Admitting: Surgery

## 2010-12-05 ENCOUNTER — Ambulatory Visit (HOSPITAL_COMMUNITY)
Admission: RE | Admit: 2010-12-05 | Discharge: 2010-12-05 | Disposition: A | Discharge status: Home / Self Care | Payer: Medicare Other | Source: Ambulatory Visit | Attending: Surgery | Admitting: Surgery

## 2010-12-05 DIAGNOSIS — Z86711 Personal history of pulmonary embolism: Secondary | ICD-10-CM | POA: Insufficient documentation

## 2010-12-05 DIAGNOSIS — Z87891 Personal history of nicotine dependence: Secondary | ICD-10-CM | POA: Insufficient documentation

## 2010-12-05 DIAGNOSIS — K4090 Unilateral inguinal hernia, without obstruction or gangrene, not specified as recurrent: Secondary | ICD-10-CM | POA: Insufficient documentation

## 2010-12-05 DIAGNOSIS — K219 Gastro-esophageal reflux disease without esophagitis: Secondary | ICD-10-CM | POA: Insufficient documentation

## 2010-12-05 DIAGNOSIS — Z01812 Encounter for preprocedural laboratory examination: Secondary | ICD-10-CM | POA: Insufficient documentation

## 2010-12-05 LAB — PROTIME-INR: INR: 1.32 (ref 0.00–1.49)

## 2010-12-08 ENCOUNTER — Other Ambulatory Visit: Payer: Self-pay

## 2010-12-08 ENCOUNTER — Encounter: Payer: Self-pay | Admitting: Family Medicine

## 2010-12-08 ENCOUNTER — Ambulatory Visit (INDEPENDENT_AMBULATORY_CARE_PROVIDER_SITE_OTHER): Payer: Medicare Other | Admitting: Family Medicine

## 2010-12-08 VITALS — BP 150/98 | Temp 98.3°F | Ht 71.0 in | Wt 198.0 lb

## 2010-12-08 DIAGNOSIS — I2699 Other pulmonary embolism without acute cor pulmonale: Secondary | ICD-10-CM | POA: Insufficient documentation

## 2010-12-09 NOTE — Op Note (Signed)
Donald Villegas, Donald Villegas                 ACCOUNT NO.:  [id number]  MEDICAL RECORD NO.:  [id number]           PATIENT TYPE:  O  LOCATION:  [facility]                         FACILITY:  [facility]  PHYSICIAN:  Currie Paris, M.D.DATE OF BIRTH:  Apr 18, 1946  DATE OF PROCEDURE:  12/05/2010 DATE OF DISCHARGE:  12/05/2010                              OPERATIVE REPORT   PREOPERATIVE DIAGNOSIS:  Large right inguinal hernia, probably indirect.  POSTOPERATIVE DIAGNOSIS:  Large direct and indirect right inguinal hernia  PROCEDURE:  Repair with mesh.  SURGEON:  Currie Paris, MD  ANESTHESIA:  General.  CLINICAL HISTORY:  This is a 65 year old gentleman, who has had a known very large right inguinal hernia extending down towards the scrotal area and was thought to be indirect.  Several months ago, we had planned to repair it, but he developed DVT, pulmonary embolus, and had to be anticoagulated.  He is now thought stable for surgery.  DESCRIPTION OF PROCEDURE:  I saw the patient in the holding area and we confirmed the plans for the procedure.  We noted that he has been off Coumadin.  His INR was normalized, but he did get a dose of Lovenox this morning about 7 hours prior to surgery.  We discussed the fact that this might increase the risk for bleeding issues perioperatively, but that we should be okay with the proviso that he could have some additional bleeding, bruising, etc.  He did not wish to postpone the surgery.  The right side was marked and the patient taken to the operating room. After satisfactory general endotracheal anesthesia had been obtained, the right lower abdomen was clipped, prepped, and draped.  The time-out was done.  I injected 0.25% plain Marcaine around the incision line and below the fascia at the anterior superior iliac spine.  I made the usual inguinal incision, divided down to the external oblique, bleeders coagulated or tied.  We spent a lot of time making  sure there was no bleeding.  The external oblique was opened in line of its fibers.  I could see a sac coming through the external ring prior to even opening the external oblique fascia.  Retracted the fascia up and dissected the cord structures off the floor. I noticed there was a large direct hernia protruding directly through the floor with what looked like some hernia sac stuck to the back of the cord structures.  This was all dissected off.  There was a fairly thickened hernia sac extending down towards the scrotum, still contained within the cord.  I identified it close to the deep ring and opened it with my finger, and I was able to use that to dissect around it and then divided so that I left the distal half opened, but un-removed as I did not want to do an extensive dissection just to get the distal sac out.  I put a pursestring to close the sac using 2-0 silk and it reduced nicely unto the deep ring.  I had the exposure well outlined, so I put a piece of mesh in.  Prior to doing that, I put  three sutures of 2-0 Prolene in the transversalis and internal oblique to close the direct defect and hold it reduced.  These were tied down with basically no tension.  A large overlay of mesh was then placed suturing it to the pubic tubercle and running 2-0 Prolene inferiorly until I got to the deep ring, and then tacking it medially to the internal oblique.  It was split to go around the cord and tacked laterally.  I did use SNOW to put along underneath the repair to make sure everything was dry, but we spent several minutes making sure there was no bleeding prior to suturing the mesh in and then after it again. However, because of the fact that he has been on Lovenox for his DVTs, he is going right back on Coumadin, I felt that it would be appropriate to put some extra precautions for using the SNOW.  I irrigated, made sure everything was dry.  I put more local in as I had been  working.  I then closed the external oblique with 3-0 Vicryl followed by Ellamae Sia, again with 3-0 Vicryl.  Skin was closed with 4-0 Monocryl subcuticular and Dermabond.  The patient tolerated the procedure well.  There were no operative complications.  All counts were correct.     Currie Paris, M.D.     CJS/MEDQ  D:  12/05/2010  T:  12/06/2010  Job:  [id number]  cc:   Tinnie Gens A. Tawanna Cooler, MD  Electronically Signed by Cyndia Bent M.D. on 12/09/2010 08:35:40 AM

## 2010-12-10 ENCOUNTER — Encounter: Payer: Self-pay | Admitting: Family Medicine

## 2010-12-10 NOTE — Patient Instructions (Signed)
Resume Coumadin as directed.  Return in one month for follow-up protime

## 2010-12-10 NOTE — Progress Notes (Signed)
  Subjective:    Patient ID: Donald Villegas, male    DOB: 1946-04-29, 65 y.o.   MRN: [id number]  HPI Al Is here for a follow-up protime.  He was on Lovenox temporarily while he had surgery.  He is now back on his Coumadin and INR 1.7   Review of Systems    Negative Objective:   Physical Exam    Well-developed well-nourished, male in no acute distress    Assessment & Plan:  Chronic Coumadin therapy because of underlying cardiomyopathy.  Continue Coumadin.  Follow-up p.r.n.

## 2011-01-01 LAB — PROTIME-INR
INR: 1.13 (ref 0.00–1.49)
INR: 1.15 (ref 0.00–1.49)
INR: 1.33 (ref 0.00–1.49)
INR: 1.53 — ABNORMAL HIGH (ref 0.00–1.49)
INR: 1.08 (ref 0.00–1.49)
INR: 1.14 (ref 0.00–1.49)
Prothrombin Time: 14.7 seconds (ref 11.6–15.2)
Prothrombin Time: 14.9 seconds (ref 11.6–15.2)
Prothrombin Time: 14.8 seconds (ref 11.6–15.2)

## 2011-01-01 LAB — DIFFERENTIAL
Basophils Absolute: 0 10*3/uL (ref 0.0–0.1)
Basophils Relative: 1 % (ref 0–1)
Basophils Relative: 1 % (ref 0–1)
Eosinophils Absolute: 0.1 10*3/uL (ref 0.0–0.7)
Eosinophils Relative: 5 % (ref 0–5)
Lymphs Abs: 0.9 10*3/uL (ref 0.7–4.0)
Lymphs Abs: 1.1 10*3/uL (ref 0.7–4.0)
Lymphs Abs: 1.6 10*3/uL (ref 0.7–4.0)
Monocytes Absolute: 0.5 10*3/uL (ref 0.1–1.0)
Monocytes Absolute: 0.7 10*3/uL (ref 0.1–1.0)
Monocytes Relative: 7 % (ref 3–12)
Monocytes Relative: 7 % (ref 3–12)
Neutro Abs: 5 10*3/uL (ref 1.7–7.7)
Neutro Abs: 6.7 10*3/uL (ref 1.7–7.7)
Neutro Abs: 6.8 10*3/uL (ref 1.7–7.7)
Neutrophils Relative %: 74 % (ref 43–77)
Neutrophils Relative %: 74 % (ref 43–77)
Neutrophils Relative %: 81 % — ABNORMAL HIGH (ref 43–77)

## 2011-01-01 LAB — COMPREHENSIVE METABOLIC PANEL
ALT: 14 U/L (ref 0–53)
ALT: 15 U/L (ref 0–53)
Albumin: 3.7 g/dL (ref 3.5–5.2)
Alkaline Phosphatase: 46 U/L (ref 39–117)
BUN: 18 mg/dL (ref 6–23)
CO2: 26 mEq/L (ref 19–32)
CO2: 29 mEq/L (ref 19–32)
Calcium: 8.2 mg/dL — ABNORMAL LOW (ref 8.4–10.5)
Calcium: 9 mg/dL (ref 8.4–10.5)
Chloride: 103 mEq/L (ref 96–112)
Creatinine, Ser: 1.07 mg/dL (ref 0.4–1.5)
GFR calc Af Amer: >60 mL/min (ref >60)
GFR calc non Af Amer: >60 mL/min (ref >60)
Glucose, Bld: 164 mg/dL — ABNORMAL HIGH (ref 70–99)
Sodium: 135 mEq/L (ref 135–145)
Total Protein: 6.6 g/dL (ref 6.0–8.3)

## 2011-01-01 LAB — CARDIAC PANEL(CRET KIN+CKTOT+MB+TROPI)
CK, MB: 1.9 ng/mL (ref 0.3–4.0)
CK, MB: 0.7 ng/mL (ref 0.3–4.0)
CK, MB: 0.7 ng/mL (ref 0.3–4.0)
Relative Index: INVALID (ref 0.0–2.5)
Total CK: 22 U/L (ref 7–232)
Total CK: 28 U/L (ref 7–232)
Total CK: 14 U/L (ref 7–232)
Total CK: 19 U/L (ref 7–232)
Troponin I: 0.02 ng/mL (ref 0.00–0.06)
Troponin I: 0.01 ng/mL (ref 0.00–0.06)

## 2011-01-01 LAB — BASIC METABOLIC PANEL
BUN: 16 mg/dL (ref 6–23)
CO2: 24 mEq/L (ref 19–32)
Creatinine, Ser: 1.05 mg/dL (ref 0.4–1.5)
GFR calc non Af Amer: >60 mL/min (ref >60)
GFR calc non Af Amer: >60 mL/min (ref >60)
GFR calc non Af Amer: >60 mL/min (ref >60)
Glucose, Bld: 91 mg/dL (ref 70–99)
Potassium: 4.1 mEq/L (ref 3.5–5.1)
Potassium: 4.2 mEq/L (ref 3.5–5.1)
Sodium: 140 mEq/L (ref 135–145)

## 2011-01-01 LAB — CBC
HCT: 42.5 % (ref 39.0–52.0)
HCT: 44.5 % (ref 39.0–52.0)
HCT: 44.5 % (ref 39.0–52.0)
HCT: 42.3 % (ref 39.0–52.0)
Hemoglobin: 14.6 g/dL (ref 13.0–17.0)
Hemoglobin: 15.3 g/dL (ref 13.0–17.0)
Hemoglobin: 14.8 g/dL (ref 13.0–17.0)
Hemoglobin: 15.8 g/dL (ref 13.0–17.0)
MCH: 33.1 pg (ref 26.0–34.0)
MCH: 33.6 pg (ref 26.0–34.0)
MCH: 33.2 pg (ref 26.0–34.0)
MCH: 33.6 pg (ref 26.0–34.0)
MCHC: 34.3 g/dL (ref 30.0–36.0)
MCHC: 35.1 g/dL (ref 30.0–36.0)
MCV: 95.7 fL (ref 78.0–100.0)
MCV: 95.7 fL (ref 78.0–100.0)
MCV: 95.9 fL (ref 78.0–100.0)
Platelets: 197 10*3/uL (ref 150–400)
RBC: 4.65 MIL/uL (ref 4.22–5.81)
RBC: 4.78 MIL/uL (ref 4.22–5.81)
RDW: 13.8 % (ref 11.5–15.5)
WBC: 5.7 10*3/uL (ref 4.0–10.5)
WBC: 6.3 10*3/uL (ref 4.0–10.5)

## 2011-01-01 LAB — LUPUS ANTICOAGULANT PANEL
DRVVT: 49.9 secs — ABNORMAL HIGH (ref 36.2–44.3)
Lupus Anticoagulant: DETECTED — AB
dRVVT Incubated 1:1 Mix: 41.8 secs (ref 36.2–44.3)

## 2011-01-01 LAB — POCT CARDIAC MARKERS
CKMB, poc: <1 ng/mL — ABNORMAL LOW (ref 1.0–8.0)
Myoglobin, poc: 45.2 ng/mL (ref 12–200)
Troponin i, poc: <0.05 ng/mL (ref 0.00–0.09)

## 2011-01-01 LAB — CULTURE, BLOOD (ROUTINE X 2): Culture  Setup Time: 201109140214

## 2011-01-01 LAB — MAGNESIUM: Magnesium: 1.9 mg/dL (ref 1.5–2.5)

## 2011-01-01 LAB — BETA-2-GLYCOPROTEIN I ABS, IGG/M/A: Beta-2-Glycoprotein I IgA: 19 A Units (ref <20)

## 2011-01-01 LAB — PROTEIN S, TOTAL: Protein S Ag, Total: 95 % (ref 70–140)

## 2011-01-01 LAB — PHOSPHORUS: Phosphorus: 2.8 mg/dL (ref 2.3–4.6)

## 2011-01-01 LAB — PROTHROMBIN GENE MUTATION

## 2011-01-01 LAB — PROTEIN C, TOTAL: Protein C, Total: 84 % (ref 70–140)

## 2011-01-01 LAB — CARDIOLIPIN ANTIBODIES, IGG, IGM, IGA: Anticardiolipin IgM: 6 MPL U/mL — ABNORMAL LOW (ref <11)

## 2011-01-01 LAB — APTT: aPTT: 28 seconds (ref 24–37)

## 2011-01-01 LAB — HEPARIN LEVEL (UNFRACTIONATED): Heparin Unfractionated: 0.34 IU/mL (ref 0.30–0.70)

## 2011-01-04 LAB — COMPREHENSIVE METABOLIC PANEL
AST: 20 U/L (ref 0–37)
Albumin: 3.3 g/dL — ABNORMAL LOW (ref 3.5–5.2)
Alkaline Phosphatase: 47 U/L (ref 39–117)
CO2: 25 mEq/L (ref 19–32)
Chloride: 105 mEq/L (ref 96–112)
GFR calc Af Amer: >60 mL/min (ref >60)
GFR calc non Af Amer: >60 mL/min (ref >60)
Potassium: 3.8 mEq/L (ref 3.5–5.1)
Total Bilirubin: 1.3 mg/dL — ABNORMAL HIGH (ref 0.3–1.2)

## 2011-01-04 LAB — CBC
Hemoglobin: 14.7 g/dL (ref 13.0–17.0)
MCH: 33.4 pg (ref 26.0–34.0)
MCV: 98.1 fL (ref 78.0–100.0)
Platelets: 163 10*3/uL (ref 150–400)
RBC: 4.41 MIL/uL (ref 4.22–5.81)
WBC: 12.3 10*3/uL — ABNORMAL HIGH (ref 4.0–10.5)

## 2011-01-04 LAB — DIFFERENTIAL
Basophils Absolute: 0 10*3/uL (ref 0.0–0.1)
Basophils Relative: 0 % (ref 0–1)
Eosinophils Absolute: 0 10*3/uL (ref 0.0–0.7)
Eosinophils Relative: 0 % (ref 0–5)
Monocytes Absolute: 1.2 10*3/uL — ABNORMAL HIGH (ref 0.1–1.0)

## 2011-01-04 LAB — URINALYSIS, ROUTINE W REFLEX MICROSCOPIC
Bilirubin Urine: NEGATIVE
Hgb urine dipstick: NEGATIVE
Ketones, ur: NEGATIVE mg/dL
Protein, ur: NEGATIVE mg/dL
Urobilinogen, UA: 1 mg/dL (ref 0.0–1.0)

## 2011-01-04 LAB — LIPASE, BLOOD: Lipase: 31 U/L (ref 11–59)

## 2011-01-05 ENCOUNTER — Ambulatory Visit: Payer: Medicare Other

## 2011-03-06 ENCOUNTER — Other Ambulatory Visit: Payer: Self-pay | Admitting: Family Medicine

## 2011-03-06 NOTE — H&P (Signed)
NAME:  Donald Villegas, Donald Villegas NO.:  [id number]   MEDICAL RECORD NO.:  [id number]                   PATIENT TYPE:  EMS   LOCATION:  [facility]                                 FACILITY:  [facility]   PHYSICIAN:  Gordy Savers, M.D. [facility]      DATE OF BIRTH:  11/26/1945   DATE OF ADMISSION:  02/05/2004  DATE OF DISCHARGE:                                HISTORY & PHYSICAL   CHIEF COMPLAINT:  Chest pain.   HISTORY OF PRESENT ILLNESS:  The patient is a 65 year old gentleman who was  stable until several hours prior to admission.  He initially noticed a sense  of unwellness and left sided pleuritic chest pain. This intensified and he  became short of breath and presented the emergency department for  evaluation.  A spiral CT scan was performed that confirmed bilateral  pulmonary embolism. The patient is now admitted for further evaluation and  treatment.   The patient has been clinically stable without acute illness.  There has  been no change in his vigorous activity level. There is no prior personal or  family history of clotting abnormalities.   PAST MEDICAL HISTORY:  The patient has a history of gastroesophageal reflux  disease, seasonal allergic rhinitis.  He has had a remote tonsillectomy in  1950.  He was treated last summer for rocky mountain spotted fever.  Since  October of 2003, he has been treated at a variety of medical centers for an  undefined dermatitis. He has taken methotrexate periodically and at the  present time is completing a course of Cipro for this dermatitis.   CURRENT MEDICATIONS:  1. Cipro 750 b.i.d.  2. Prevacid 30 mg daily.  3. Allegra p.r.n.  4. Cyclical methotrexate.  This is scheduled to be resumed following his     dose of Cipro.   SOCIAL HISTORY:  The patient is a nonsmoker, quite active, he plays tennis 2  or 3 times weekly.   REVIEW OF SYMPTOMS:  Negative. There has been no change in his activity  level, trauma.  He has been  doing well until the onset of his acute illness.   FAMILY HISTORY:  Noncontributory without any history of deep venous  thrombosis or pulmonary embolism.  Father died in his late 40's or early  28's of apparently a streptococcal infection. Mother died at 19 of  complications of ALS.   PHYSICAL EXAMINATION:  GENERAL:  Revealed a healthy appearing middle-aged  male in no acute distress.  VITAL SIGNS:  Temperature was 99 degrees, respiratory rate 20, blood  pressure 125/80.  O2 saturation in the mid 40's on supplemental O2.  SKIN:  Unremarkable without diaphoresis.  HEAD/NECK:  Revealed normal fundi.  Ear, nose and throat clear. Neck no  bruits or adenopathy.  CHEST:  Clear, no obvious rub noted although inspiration was limited.  CARDIOVASCULAR:  Revealed a normal regular rhythm.  Heart sounds normal, no  murmurs or gallops.  ABDOMEN:  Soft and nontender. No organomegaly. No bruits appreciated.  EXTREMITIES:  Revealed no tenderness, edema or signs of deep venous  thrombosis.  Peripheral pulses were full.   IMPRESSION:  Acute pulmonary embolism.   DISPOSITION:  The patient will be admitted to the hospital.  He has already  been started on intravenous heparin. This will be continued via pharmacy  protocol. Coumadin therapy will be instituted within the next day or two.  In view of his lack of predisposing risk factors for pulmonary embolism, a  hypercoagulability panel will be obtained.                                                Gordy Savers, M.D. [facility]    PFK/MEDQ  D:  02/06/2004  T:  02/07/2004  Job:  [id number]

## 2011-03-06 NOTE — Discharge Summary (Signed)
NAME:  Donald Villegas, ROPP                           ACCOUNT NO.:  [id number]   MEDICAL RECORD NO.:  [id number]                   PATIENT TYPE:  INP   LOCATION:  3714                                 FACILITY:  [facility]   PHYSICIAN:  Cornell Barman, P.A. [facility]            DATE OF BIRTH:  10/14/1946   DATE OF ADMISSION:  02/05/2004  DATE OF DISCHARGE:  02/09/2004                                 DISCHARGE SUMMARY   DISCHARGE DIAGNOSES:  1. Acute left pulmonary artery embolus.  2. Bilateral lower lobe infiltrate left greater than right.  3. Pleuritic pain.   HISTORY OF PRESENT ILLNESS:  [name] is a 65 year old white male admitted  to [facility] secondary to dyspnea and left chest pain.  CT of the chest was consistent with pulmonary embolus.   PAST MEDICAL HISTORY:  1. Gastroesophageal reflux disease.  2. History of [facility] Spotted fever in July of 2004.  3. Undefined dermatiditis.  4. Status post T&A in 1950.   HOSPITAL COURSE:  Problem 1:  PULMONARY:  The patient presented with an  acute pulmonary embolus in the distal left pulmonary artery as well as left  upper and lower lobe pulmonary artery branches.  This was associated with  bilateral lower lobe infiltrates, left being greater than right.  The  patient was admitted for anticoagulation.   Initially, he was started on heparin.  This was changed to Lovenox and  Coumadin.  [name] O2 saturation have improved.  He is currently at  96% or greater on room air.  The patient is having persistent pleuritic pain  secondary to the PE and tracheostomy.  We will continue his Vicodin for now.   As noted, the patient does have bibasilar infiltrate; however, he is not  running a fever and no evidence of elevated white count.  Therefore, there  is no evidence of an infection at this time.  The patient is encouraged to  take regular deep inspirations to prevent development of pneumonia.   LABORATORY DATA:  Pro time was  13.9, INR 1.1.  CBC was normal.  D-dimer was  1.34.   DISCHARGE MEDICATIONS:  1. Lovenox 85 mg subcutaneously q.12h. for another 5 to 7 days.  2. Coumadin 5 mg q.d.   FOLLOW UP:  Follow up for a pro time on Monday, February 11, 2004.  Follow up  with Dr. Gordy Savers on February 15, 2004 at 11:30 a.m.  Dr. Gordy Savers will manage the patient's pro time.                                                Cornell Barman, P.A. [facility]    LC/MEDQ  D:  02/08/2004  T:  02/09/2004  Job:  [id number]  cc:   Gordy Savers, M.D. [facility]

## 2011-06-08 ENCOUNTER — Ambulatory Visit: Payer: No Typology Code available for payment source | Admitting: Internal Medicine

## 2011-07-13 ENCOUNTER — Ambulatory Visit: Payer: No Typology Code available for payment source | Admitting: Family Medicine

## 2011-07-28 ENCOUNTER — Ambulatory Visit (INDEPENDENT_AMBULATORY_CARE_PROVIDER_SITE_OTHER): Payer: Medicare Other | Admitting: Family Medicine

## 2011-07-28 ENCOUNTER — Encounter: Payer: Self-pay | Admitting: Family Medicine

## 2011-07-28 VITALS — BP 100/78 | Temp 98.0°F | Wt 195.0 lb

## 2011-07-28 DIAGNOSIS — I2699 Other pulmonary embolism without acute cor pulmonale: Secondary | ICD-10-CM

## 2011-07-28 DIAGNOSIS — Z23 Encounter for immunization: Secondary | ICD-10-CM

## 2011-07-28 LAB — LIPID PANEL
Cholesterol: 211 mg/dL — ABNORMAL HIGH (ref 0–200)
Total CHOL/HDL Ratio: 6
Triglycerides: 344 mg/dL — ABNORMAL HIGH (ref 0.0–149.0)
VLDL: 68.8 mg/dL — ABNORMAL HIGH (ref 0.0–40.0)

## 2011-07-28 LAB — BASIC METABOLIC PANEL
BUN: 20 mg/dL (ref 6–23)
Calcium: 9.3 mg/dL (ref 8.4–10.5)
Creatinine, Ser: 1 mg/dL (ref 0.4–1.5)
GFR: 75.6 mL/min (ref >60.00)
Glucose, Bld: 91 mg/dL (ref 70–99)
Potassium: 4.7 mEq/L (ref 3.5–5.1)

## 2011-07-28 LAB — HEPATIC FUNCTION PANEL
AST: 25 U/L (ref 0–37)
Total Bilirubin: 0.8 mg/dL (ref 0.3–1.2)

## 2011-07-28 LAB — CBC WITH DIFFERENTIAL/PLATELET
Basophils Absolute: 0 10*3/uL (ref 0.0–0.1)
HCT: 47.7 % (ref 39.0–52.0)
Lymphs Abs: 1.3 10*3/uL (ref 0.7–4.0)
Monocytes Absolute: 0.7 10*3/uL (ref 0.1–1.0)
Monocytes Relative: 12.7 % — ABNORMAL HIGH (ref 3.0–12.0)
Neutrophils Relative %: 58.5 % (ref 43.0–77.0)
Platelets: 196 10*3/uL (ref 150.0–400.0)
RDW: 13.6 % (ref 11.5–14.6)

## 2011-07-28 LAB — POCT URINALYSIS DIPSTICK
Blood, UA: NEGATIVE
Nitrite, UA: NEGATIVE
Urobilinogen, UA: 0.2
pH, UA: 8.5

## 2011-07-28 LAB — LDL CHOLESTEROL, DIRECT: Direct LDL: 118.3 mg/dL

## 2011-07-28 NOTE — Patient Instructions (Signed)
Continue your current medications  Set up a 30 minute appointment sometime in the next couple weeks for general physical examination  Labwork today.  Strongly consider the shingles.  Vaccination

## 2011-07-28 NOTE — Progress Notes (Signed)
  Subjective:    Patient ID: Waldemar Siegel, male    DOB: 1946-07-25, 65 y.o.   MRN: [id number]  HPI Al Is a 65 year old, married male, nonsmoker, who comes in today for follow-up of a pulmonary embolus.  His currently on 5 mg of Coumadin Monday through Friday 2.5, Saturday, Sunday, INR is always between two and 3.  No bruising.  He feels well.  No chest pain or shortness of breath.  He stated the back and see his dermatologist, Dr. Yetta Barre for skin exam he also has a chronic dermatitis on his arms that only responds to oral prednisone.  He said numerous workups at [facility] wake, etc., and nobody can find the etiology of his rash.  Review of Systems General and pulmonary is systems otherwise negative    Objective:   Physical Exam  Well-developed well-nourished, male in no acute distress      Assessment & Plan:  History of pulmonary embolus.  Plan continue Coumadin Cpx......, fall

## 2011-07-28 NOTE — Progress Notes (Signed)
Addended by: Azucena Freed on: 07/28/2011 09:50 AM   Modules accepted: Orders

## 2011-08-11 ENCOUNTER — Telehealth: Payer: Self-pay | Admitting: Family Medicine

## 2011-08-11 NOTE — Telephone Encounter (Signed)
Pt is sch to have all 4 wisdom teeth removed on Monday 10/29. Pt would like to know what he needs to do about his Coumedin and also if he needs to keep his ov with Dr Tawanna Cooler on 10/30

## 2011-08-12 NOTE — Telephone Encounter (Signed)
Spoke with patient.

## 2011-08-12 NOTE — Telephone Encounter (Signed)
The oral surgeons have protocols as far as when to stop the Coumadin check with the surgeon

## 2011-08-20 ENCOUNTER — Encounter: Payer: Self-pay | Admitting: Family Medicine

## 2011-08-20 ENCOUNTER — Ambulatory Visit (INDEPENDENT_AMBULATORY_CARE_PROVIDER_SITE_OTHER): Payer: Medicare Other | Admitting: Family Medicine

## 2011-08-20 VITALS — BP 130/90 | Temp 98.3°F | Ht 70.0 in | Wt 194.0 lb

## 2011-08-20 DIAGNOSIS — R21 Rash and other nonspecific skin eruption: Secondary | ICD-10-CM

## 2011-08-20 DIAGNOSIS — Z136 Encounter for screening for cardiovascular disorders: Secondary | ICD-10-CM

## 2011-08-20 DIAGNOSIS — Z7901 Long term (current) use of anticoagulants: Secondary | ICD-10-CM

## 2011-08-20 DIAGNOSIS — K219 Gastro-esophageal reflux disease without esophagitis: Secondary | ICD-10-CM

## 2011-08-20 DIAGNOSIS — Z862 Personal history of diseases of the blood and blood-forming organs and certain disorders involving the immune mechanism: Secondary | ICD-10-CM

## 2011-08-20 DIAGNOSIS — Z23 Encounter for immunization: Secondary | ICD-10-CM

## 2011-08-20 MED ORDER — WARFARIN SODIUM 5 MG PO TABS: 5.0000 mg | ORAL_TABLET | Freq: Every day | ORAL | Status: DC

## 2011-08-20 NOTE — Patient Instructions (Signed)
Continue your current medications.  Follow-up in one year or sooner if any problems 

## 2011-08-20 NOTE — Progress Notes (Signed)
  Subjective:    Patient ID: Donald Villegas, male    DOB: May 22, 1946, 65 y.o.   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  He takes Coumadin 5 mg daily for his chronic neuropathy, asymptomatic, gets his INRs monthly.  He takes over-the-counter Zantac for reflux esophagitis.  He is on eyedrops from his ophthalmologist for early glaucoma.  He takes a burst and taper of prednisone intermittently when he has a flareup of his skin rash, etiology unknown.  He gets routine eye care, hearing normal, regular dental care, colonoscopy, and GI, activities of daily living.  Normal.  He still works full time, cognitive function, normal, home health safety reviewed.  No issues identified, he has a healthcare power of attorney and living will.  He walks on a daily basis.  Tetanus today, seasonal flu shot 2012, Pneumovax 2012.  Information given on shingles.  He is due to go back to see his dermatologist.  He has multiple skin lesions, most of which have been a seborrheic keratosis.  He also has a rash that occurs in etiology unknown.     Review of Systems  Constitutional: Negative.   HENT: Negative.   Eyes: Negative.   Respiratory: Negative.   Cardiovascular: Negative.   Gastrointestinal: Negative.   Genitourinary: Negative.   Musculoskeletal: Negative.   Skin: Negative.   Neurological: Negative.   Hematological: Negative.   Psychiatric/Behavioral: Negative.        Objective:   Physical Exam  Constitutional: He is oriented to person, place, and time. He appears well-developed and well-nourished.  HENT:  Head: Normocephalic and atraumatic.  Right Ear: External ear normal.  Left Ear: External ear normal.  Nose: Nose normal.  Mouth/Throat: Oropharynx is clear and moist.    Eyes: Conjunctivae and EOM are normal. Pupils are equal, round, and reactive to light.  Neck: Normal range of motion. Neck supple. No JVD present. No tracheal deviation present. No thyromegaly present.  Cardiovascular: Normal rate, regular rhythm, normal heart sounds and intact distal pulses.  Exam reveals no gallop and no friction rub.   No murmur heard. Pulmonary/Chest: Effort normal and breath sounds normal. No stridor. No respiratory distress. He has no wheezes. He has no rales. He exhibits no tenderness.  Abdominal: Soft. Bowel sounds are normal. He exhibits no distension and no mass. There is no tenderness. There is no rebound and no guarding.  Genitourinary: Rectum normal and penis normal. Guaiac negative stool. No penile tenderness.       Prostate surgically removed  Musculoskeletal: Normal range of motion. He exhibits no edema and no tenderness.  Lymphadenopathy:    He has no cervical adenopathy.  Neurological: He is alert and oriented to person, place, and time. He has normal reflexes. No cranial nerve deficit. He exhibits normal muscle tone.  Skin: Skin is warm and dry. No rash noted. No erythema. No pallor.  Psychiatric: He has a normal mood and affect. His behavior is normal. Judgment and thought content normal.          Assessment & Plan:  Healthy male.  Coagulopathy continue Coumadin daily.  Reflux esophagitis.  Continue OTC medicine.  Early glaucoma continue eyedrops and followed by ophthalmologist.  Recurrent skin rash, followed by Debbora Dus.  Return one year or sooner if any problems

## 2011-09-09 ENCOUNTER — Encounter: Payer: Self-pay | Admitting: Family Medicine

## 2011-09-09 ENCOUNTER — Ambulatory Visit: Payer: No Typology Code available for payment source | Admitting: Family Medicine

## 2011-09-09 ENCOUNTER — Ambulatory Visit (INDEPENDENT_AMBULATORY_CARE_PROVIDER_SITE_OTHER): Payer: Medicare Other | Admitting: Family Medicine

## 2011-09-09 VITALS — BP 118/80 | Temp 98.4°F | Wt 193.0 lb

## 2011-09-09 DIAGNOSIS — J069 Acute upper respiratory infection, unspecified: Secondary | ICD-10-CM

## 2011-09-09 MED ORDER — ZOSTER VACCINE LIVE 19400 UNT/0.65ML ~~LOC~~ SOLR: 0.6500 mL | Freq: Once | SUBCUTANEOUS | Status: AC

## 2011-09-09 MED ORDER — HYDROCODONE-HOMATROPINE 5-1.5 MG/5ML PO SYRP: ORAL_SOLUTION | ORAL | Status: DC

## 2011-09-09 NOTE — Progress Notes (Signed)
  Subjective:    Patient ID: Donald Villegas, male    DOB: 07-Apr-1946, 65 y.o.   MRN: [id number]  HPIAL is a 65 year old male  Nonsmoker, married, who comes in today with a 5-day history of a cough.  A week ago.  He had oral surgery and had wisdom teeth removed.  He's currently taking Keflex 500 mg 4 times daily.  About 5 days ago he developed a cough.  No fever no earache, et Karie Soda.  No history of asthma.  He states he did have pneumonia, couple years ago.    Review of Systems    General and pulmonary review of systems otherwise negative Objective:   Physical Exam Well-developed well-nourished, male in no acute distress.  HEENT negative.  Neck was supple.  No adenopathy.  Lungs are clear       Assessment & Plan:  Viral syndrome treat symptomatically with lots of liquids.  Hydromet cough syrup.  Return p.r.n.

## 2011-09-09 NOTE — Patient Instructions (Signed)
Drink lots of liquids.  Hydromet one half to 1 teaspoon 3 times daily p.r.n. For cough

## 2011-09-17 ENCOUNTER — Other Ambulatory Visit: Payer: Self-pay | Admitting: Dermatology

## 2011-10-08 IMAGING — CR DG CHEST 2V
2 series · 2 of 2 positions shown · non-contrast
Comparison: Chest x-ray of 06/02/2010

CLINICAL DATA: Right lower chest pain, former smoker

CHEST - 2 VIEW

[w chest pa]
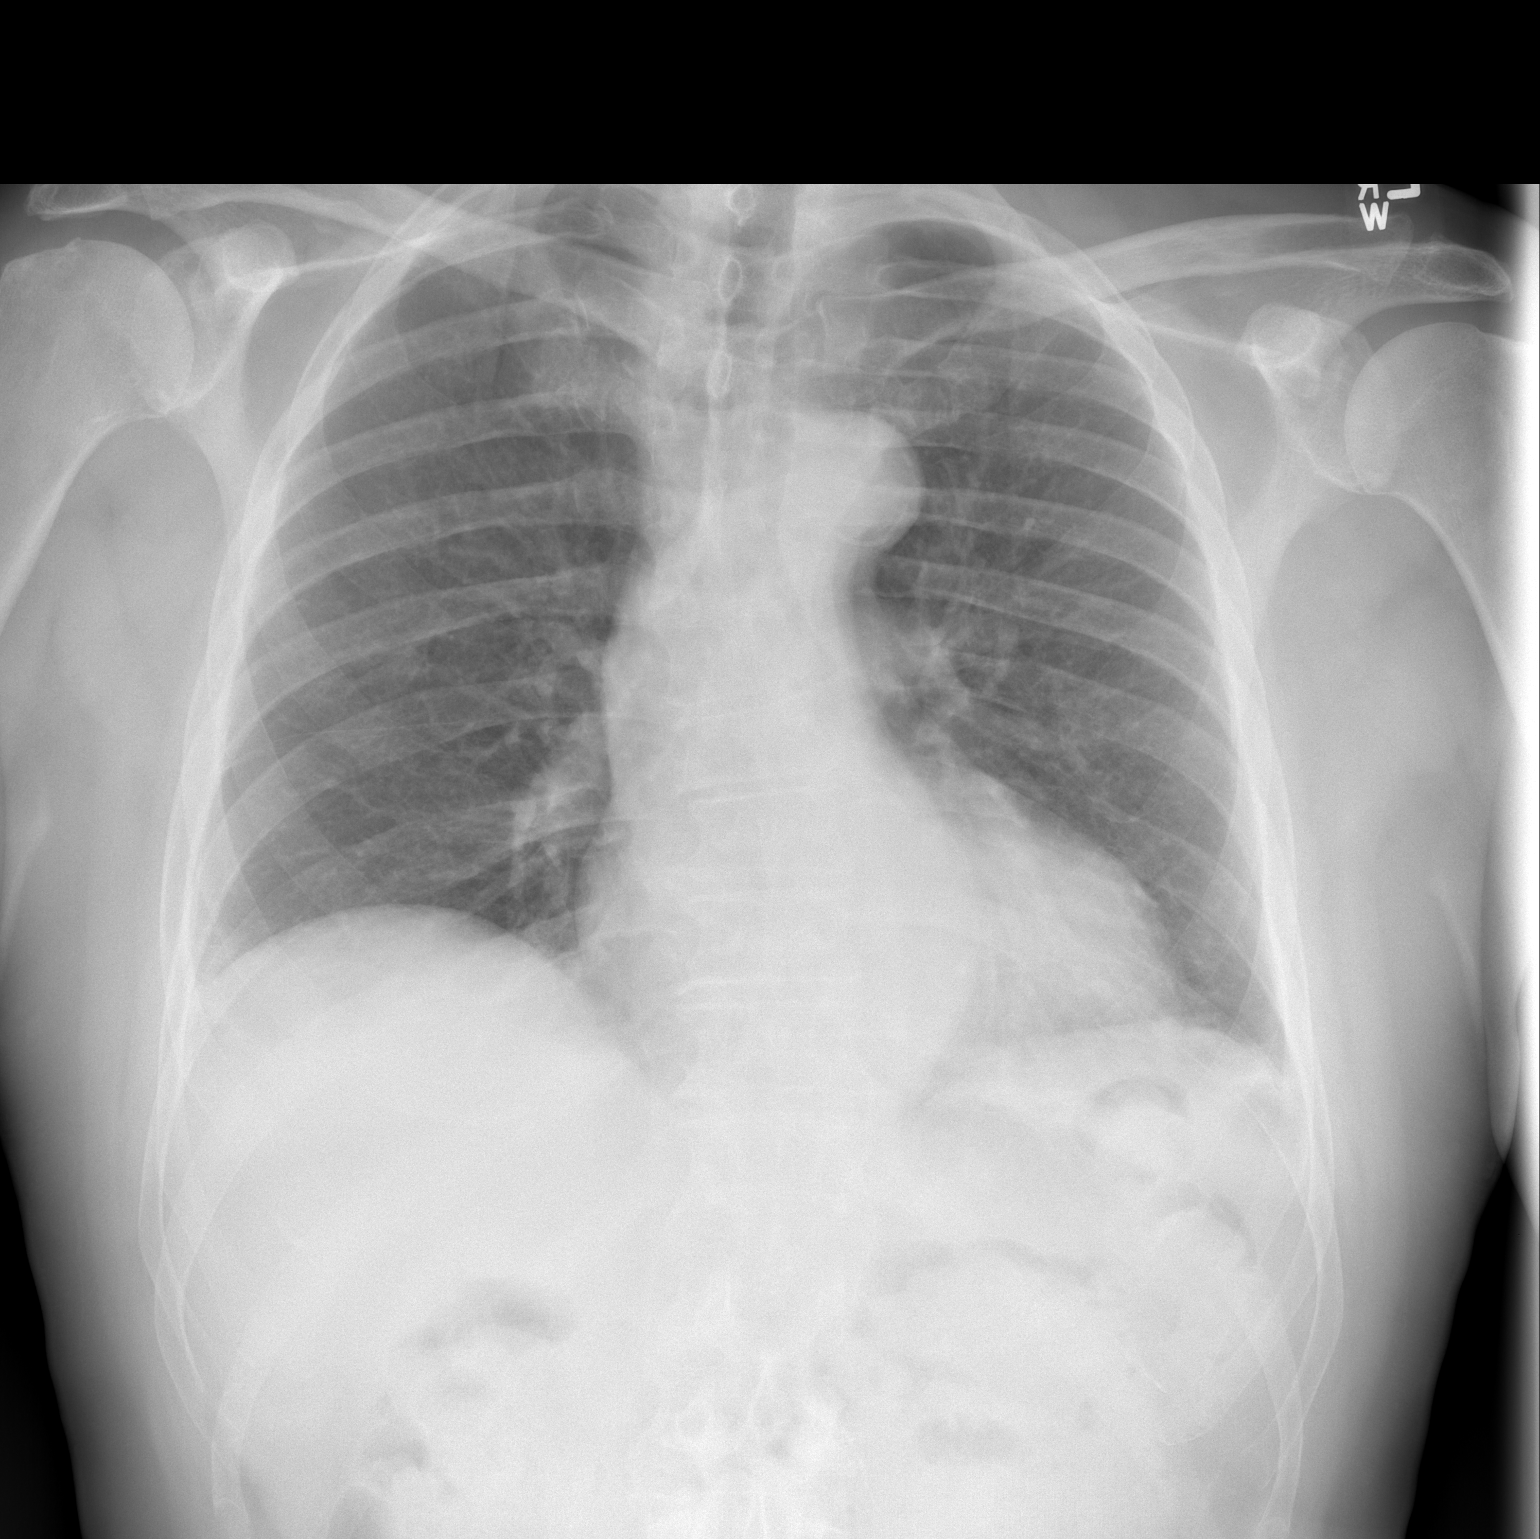

[w chest lat]
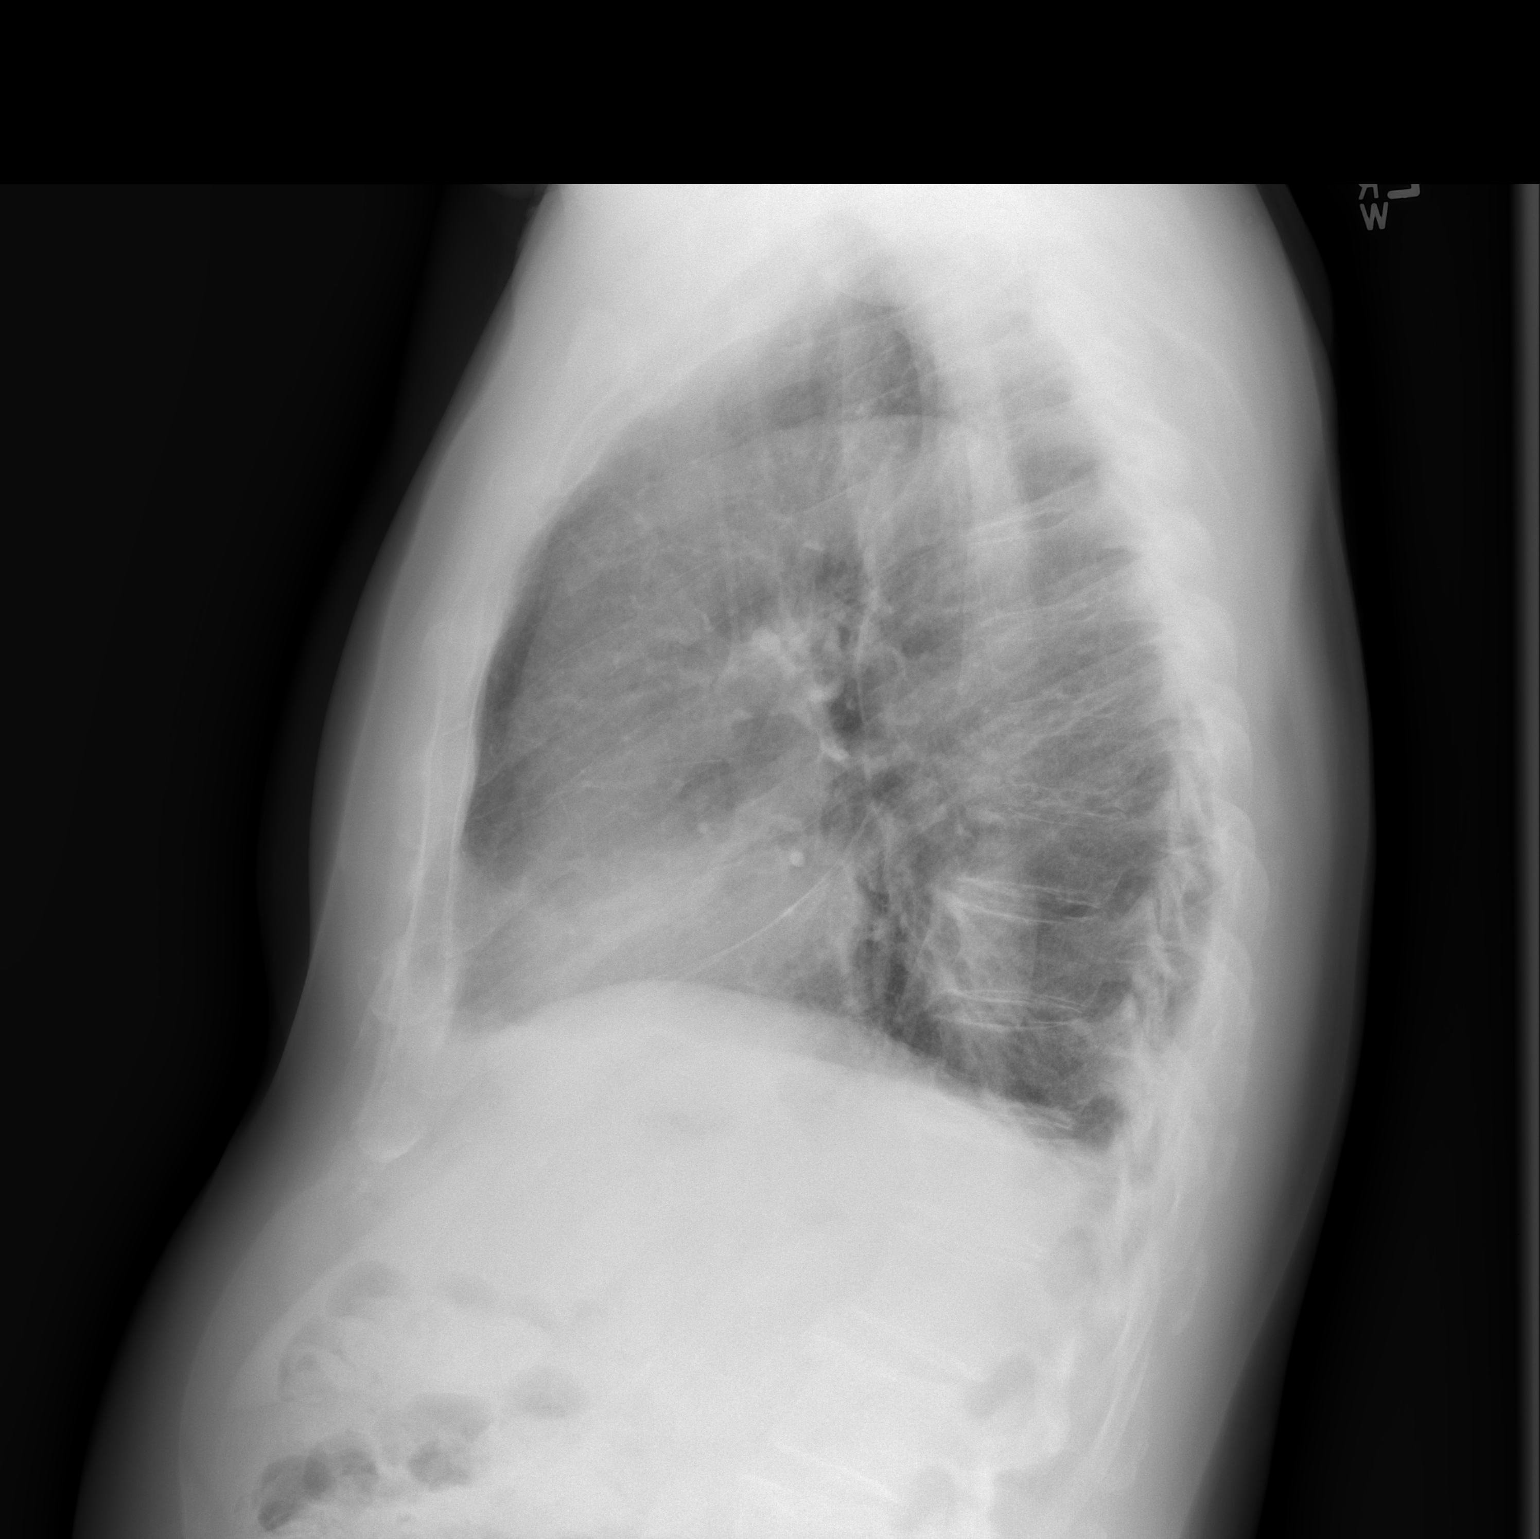

[2 of 2 positions shown; findings below may reference images not displayed]

FINDINGS: Only minimal atelectasis is noted at the lung bases.  No
definite infiltrate or effusion is seen.  The heart is within upper
limits of normal.  No acute bony abnormality is noted.
IMPRESSION: Mild basilar atelectasis.  No definite active process.

## 2011-10-08 IMAGING — CT CT ANGIO CHEST
2 of 6 series · 19 of 36 positions shown · IV contrast (APPLIED)
Comparison: 07/01/2010

CLINICAL DATA: Right-sided chest pain.  Question pneumonia.
History of PE and pneumonia several years ago.

CT ANGIOGRAPHY CHEST WITH CONTRAST
TECHNIQUE: Multidetector CT imaging of the chest was performed
using the standard protocol during bolus administration of
intravenous contrast.  Multiplanar CT image reconstructions
including MIPs were obtained to evaluate the vascular anatomy.
Contrast:  100 ml Bmnipaque-G88

[Series 6: pe thins @ 1mm · axial · 0.74mm/px · z∈[-280,-32]mm · 18 of 276 slices shown]
[im 14/276  lung]
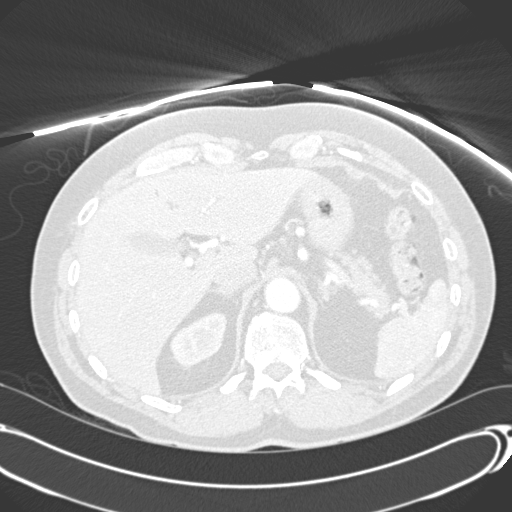
[im 28/276  mediastinal]
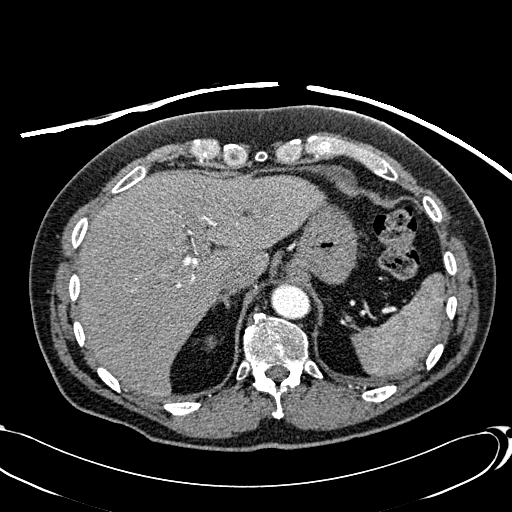
[im 42/276  lung]
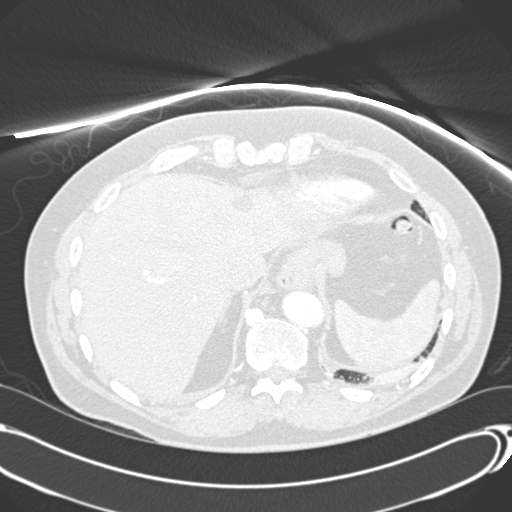
[im 56/276  mediastinal]
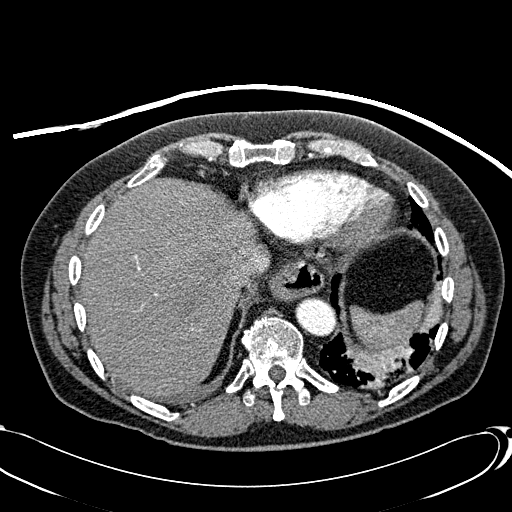
[im 69/276  lung]
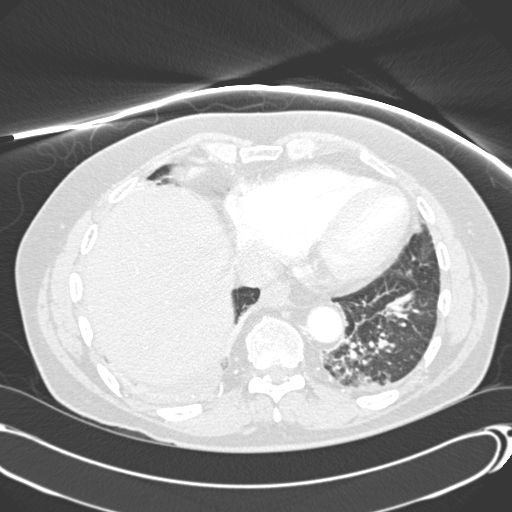
[im 83/276  mediastinal]
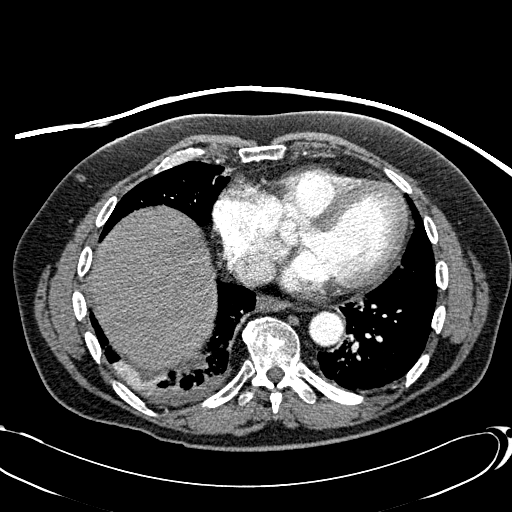
[im 97/276  lung]
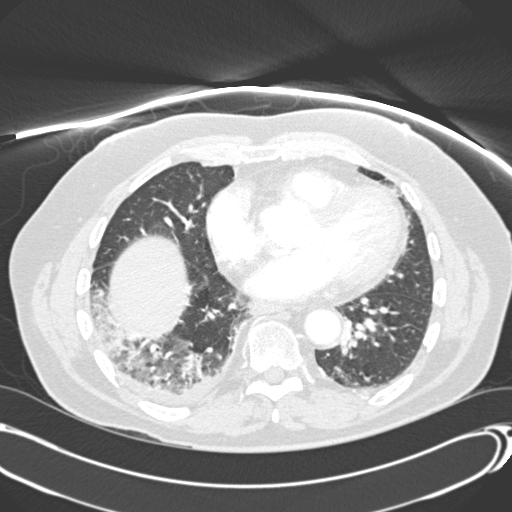
[im 111/276  mediastinal]
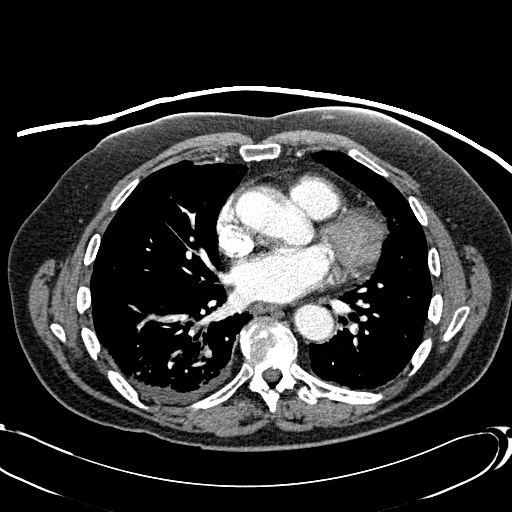
[im 124/276  lung]
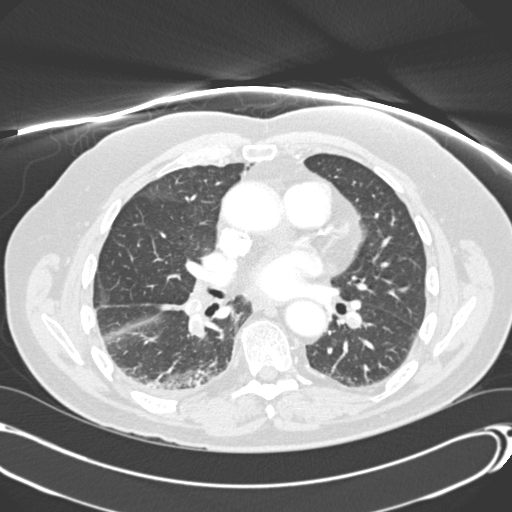
[im 152/276  mediastinal]
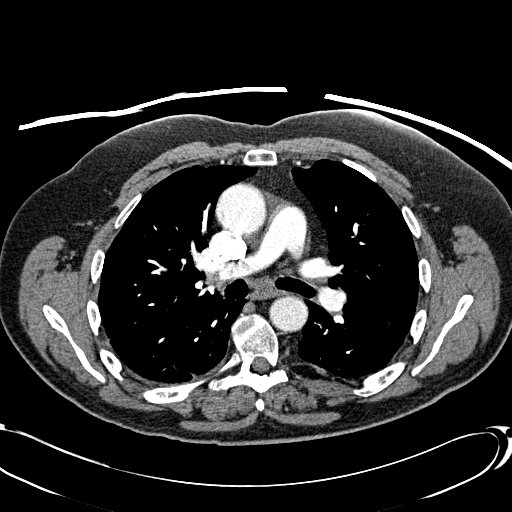
[im 166/276  lung]
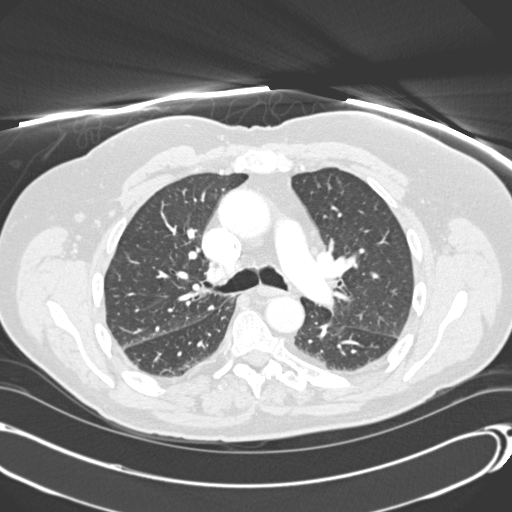
[im 179/276  mediastinal]
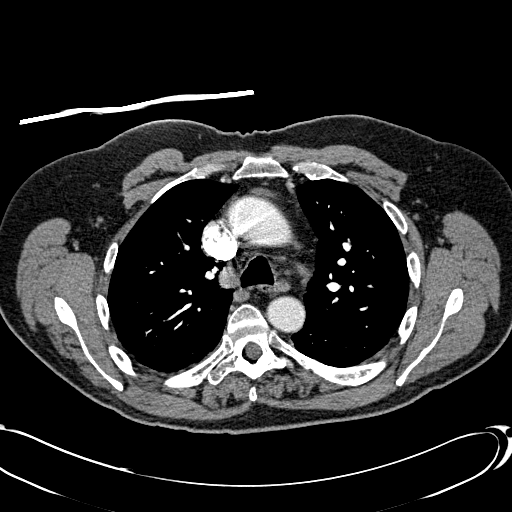
[im 193/276  lung]
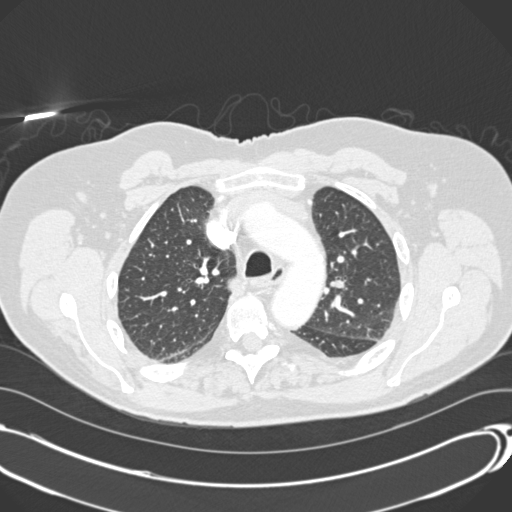
[im 207/276  mediastinal]
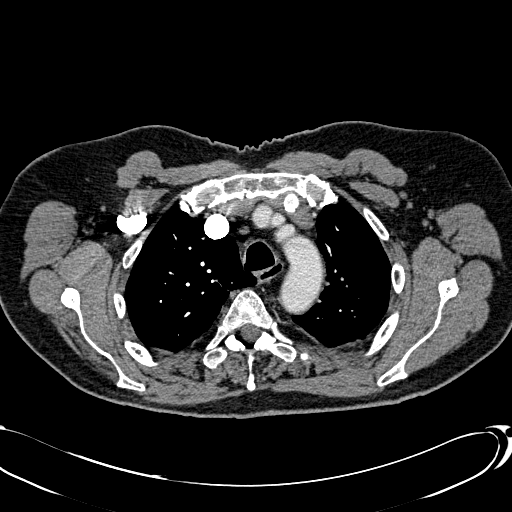
[im 221/276  lung]
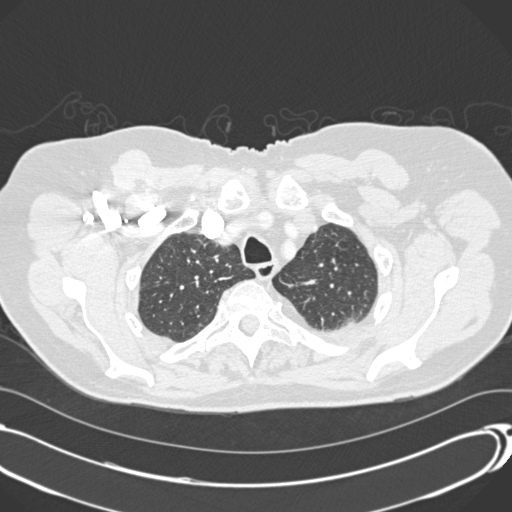
[im 234/276  mediastinal]
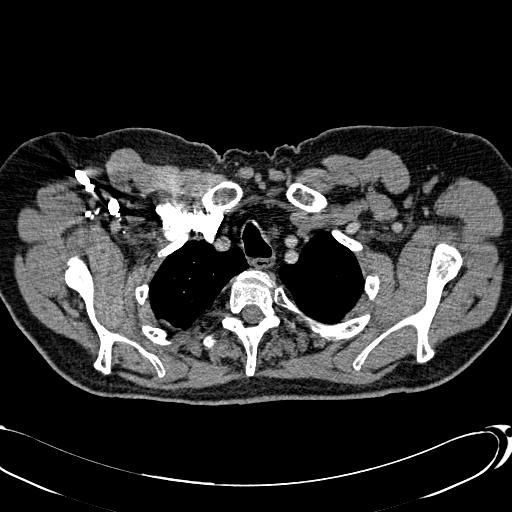
[im 248/276  lung]
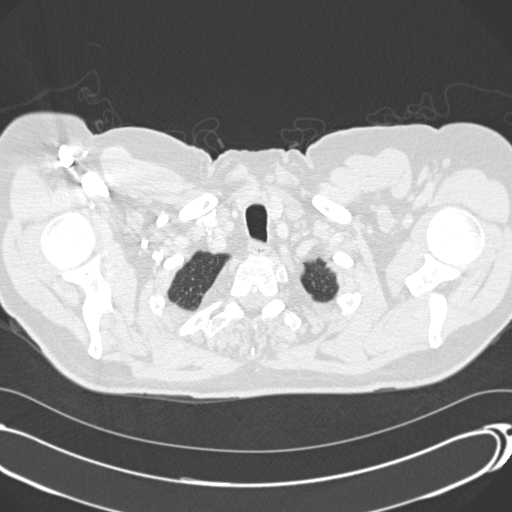
[im 262/276  mediastinal]
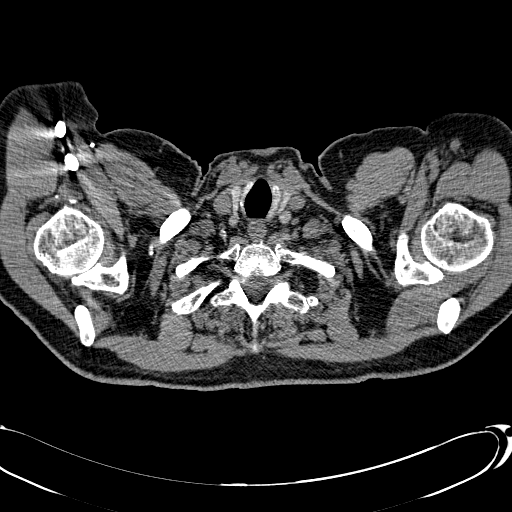

[Series 602: <mpr thick range> · coronal · 0.74mm/px · 1 of 113 slices shown]
[im 57/113  mediastinal]
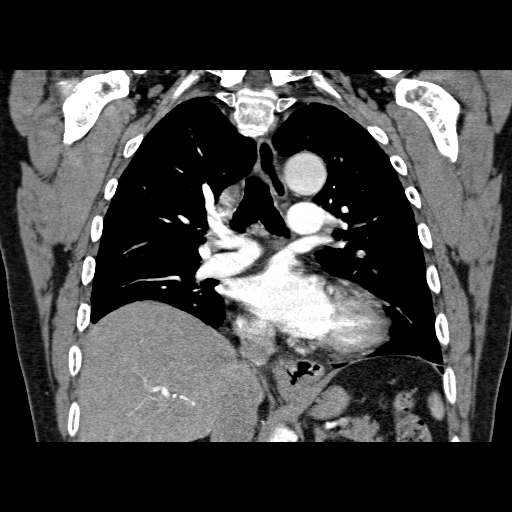

[19 of 36 positions shown; findings below may reference images not displayed]

FINDINGS: Pulmonary arteries are well opacified by the contrast
bolus.  There are bilateral pulmonary emboli.  On the left, emboli
involve both upper and lower lobe branches.  On the right, emboli
involve the right middle lobe and lower lobe branches.  Within the
right lower lobe, there is patchy air space filling, associated
with small effusion.  On the left, there is minimal atelectasis.
No pulmonary nodules are identified.  No significant mediastinal,
hilar, or axillary adenopathy.  The thyroid gland has a normal
appearance by CT.

Images of the upper abdomen show a left hepatic cyst measuring
cm in diameter.  There are significant degenerative changes in the
spine.

Review of the MIP images confirms the above findings.
IMPRESSION: 1.  Significant bilateral pulmonary emboli.
2.  Right lower lobe infiltrate or infarct.
3.  Left lower lobe atelectasis.

Critical test results telephoned to Dr. now at the time of
interpretation on 07/01/2010 at [DATE] p.m.

## 2011-10-11 ENCOUNTER — Other Ambulatory Visit: Payer: Self-pay | Admitting: Family Medicine

## 2011-10-15 ENCOUNTER — Telehealth: Payer: Self-pay | Admitting: Family Medicine

## 2011-10-15 ENCOUNTER — Other Ambulatory Visit: Payer: Self-pay | Admitting: Dermatology

## 2011-10-15 MED ORDER — HYDROCODONE-HOMATROPINE 5-1.5 MG/5ML PO SYRP: 5.0000 mL | ORAL_SOLUTION | Freq: Three times a day (TID) | ORAL | Status: AC | PRN

## 2011-10-15 NOTE — Telephone Encounter (Signed)
Pt called req refill of HYDROcodone-homatropine (HYDROMET) 5-1.5 MG/5ML syrup to [facility] [address] [phone]

## 2011-10-15 NOTE — Telephone Encounter (Signed)
rx called in and patient is aware 

## 2011-10-15 NOTE — Telephone Encounter (Signed)
Hydromet dispensed 4 ounces directions one half to 1 teaspoon at bedtime p.r.n. Cough and cold.  It refills x 1

## 2011-11-02 DIAGNOSIS — H40059 Ocular hypertension, unspecified eye: Secondary | ICD-10-CM | POA: Diagnosis not present

## 2011-11-20 DIAGNOSIS — Z8546 Personal history of malignant neoplasm of prostate: Secondary | ICD-10-CM | POA: Diagnosis not present

## 2011-11-20 DIAGNOSIS — Z09 Encounter for follow-up examination after completed treatment for conditions other than malignant neoplasm: Secondary | ICD-10-CM | POA: Diagnosis not present

## 2011-11-25 DIAGNOSIS — Z09 Encounter for follow-up examination after completed treatment for conditions other than malignant neoplasm: Secondary | ICD-10-CM | POA: Diagnosis not present

## 2011-11-25 DIAGNOSIS — Z8546 Personal history of malignant neoplasm of prostate: Secondary | ICD-10-CM | POA: Diagnosis not present

## 2011-11-25 DIAGNOSIS — N529 Male erectile dysfunction, unspecified: Secondary | ICD-10-CM | POA: Diagnosis not present

## 2011-11-25 DIAGNOSIS — C61 Malignant neoplasm of prostate: Secondary | ICD-10-CM | POA: Diagnosis not present

## 2012-02-08 DIAGNOSIS — Z85820 Personal history of malignant melanoma of skin: Secondary | ICD-10-CM | POA: Diagnosis not present

## 2012-02-08 DIAGNOSIS — Z85828 Personal history of other malignant neoplasm of skin: Secondary | ICD-10-CM | POA: Diagnosis not present

## 2012-02-08 DIAGNOSIS — L821 Other seborrheic keratosis: Secondary | ICD-10-CM | POA: Diagnosis not present

## 2012-04-05 DIAGNOSIS — Z09 Encounter for follow-up examination after completed treatment for conditions other than malignant neoplasm: Secondary | ICD-10-CM | POA: Diagnosis not present

## 2012-04-05 DIAGNOSIS — Z8546 Personal history of malignant neoplasm of prostate: Secondary | ICD-10-CM | POA: Diagnosis not present

## 2012-04-15 DIAGNOSIS — Z8546 Personal history of malignant neoplasm of prostate: Secondary | ICD-10-CM | POA: Diagnosis not present

## 2012-04-15 DIAGNOSIS — Z09 Encounter for follow-up examination after completed treatment for conditions other than malignant neoplasm: Secondary | ICD-10-CM | POA: Diagnosis not present

## 2012-04-15 DIAGNOSIS — C61 Malignant neoplasm of prostate: Secondary | ICD-10-CM | POA: Diagnosis not present

## 2012-05-02 DIAGNOSIS — H01009 Unspecified blepharitis unspecified eye, unspecified eyelid: Secondary | ICD-10-CM | POA: Diagnosis not present

## 2012-05-02 DIAGNOSIS — H40019 Open angle with borderline findings, low risk, unspecified eye: Secondary | ICD-10-CM | POA: Diagnosis not present

## 2012-05-02 DIAGNOSIS — H40059 Ocular hypertension, unspecified eye: Secondary | ICD-10-CM | POA: Diagnosis not present

## 2012-05-11 ENCOUNTER — Ambulatory Visit (INDEPENDENT_AMBULATORY_CARE_PROVIDER_SITE_OTHER): Payer: Medicare Other | Admitting: Family

## 2012-05-11 DIAGNOSIS — I2699 Other pulmonary embolism without acute cor pulmonale: Secondary | ICD-10-CM

## 2012-05-11 NOTE — Patient Instructions (Signed)
Take extra half tablet today only. Then continue 5mg  Mon-Friday and 2.5mg  on Sat and Sunday    Latest dosing instructions   Total Glynis Smiles Tue Wed Thu Fri Sat   30 2.5 mg 5 mg 5 mg 5 mg 5 mg 5 mg 2.5 mg    (5 mg0.5) (5 mg1) (5 mg1) (5 mg1) (5 mg1) (5 mg1) (5 mg0.5)

## 2012-06-03 ENCOUNTER — Encounter: Payer: Medicare Other | Admitting: Family

## 2012-06-17 ENCOUNTER — Other Ambulatory Visit: Payer: Self-pay | Admitting: Family Medicine

## 2012-08-08 ENCOUNTER — Other Ambulatory Visit: Payer: Self-pay | Admitting: Dermatology

## 2012-08-08 DIAGNOSIS — Z85820 Personal history of malignant melanoma of skin: Secondary | ICD-10-CM | POA: Diagnosis not present

## 2012-08-08 DIAGNOSIS — L905 Scar conditions and fibrosis of skin: Secondary | ICD-10-CM | POA: Diagnosis not present

## 2012-08-08 DIAGNOSIS — L819 Disorder of pigmentation, unspecified: Secondary | ICD-10-CM | POA: Diagnosis not present

## 2012-08-08 DIAGNOSIS — L570 Actinic keratosis: Secondary | ICD-10-CM | POA: Diagnosis not present

## 2012-08-08 DIAGNOSIS — L821 Other seborrheic keratosis: Secondary | ICD-10-CM | POA: Diagnosis not present

## 2012-08-08 DIAGNOSIS — D2330 Other benign neoplasm of skin of unspecified part of face: Secondary | ICD-10-CM | POA: Diagnosis not present

## 2012-08-08 DIAGNOSIS — Z85828 Personal history of other malignant neoplasm of skin: Secondary | ICD-10-CM | POA: Diagnosis not present

## 2012-08-08 DIAGNOSIS — D485 Neoplasm of uncertain behavior of skin: Secondary | ICD-10-CM | POA: Diagnosis not present

## 2012-10-04 DIAGNOSIS — M545 Low back pain, unspecified: Secondary | ICD-10-CM | POA: Diagnosis not present

## 2012-10-04 DIAGNOSIS — M25559 Pain in unspecified hip: Secondary | ICD-10-CM | POA: Diagnosis not present

## 2012-10-28 DIAGNOSIS — M7020 Olecranon bursitis, unspecified elbow: Secondary | ICD-10-CM | POA: Diagnosis not present

## 2012-10-28 DIAGNOSIS — C61 Malignant neoplasm of prostate: Secondary | ICD-10-CM | POA: Diagnosis not present

## 2012-10-29 DIAGNOSIS — M7020 Olecranon bursitis, unspecified elbow: Secondary | ICD-10-CM | POA: Diagnosis not present

## 2012-10-29 DIAGNOSIS — Z23 Encounter for immunization: Secondary | ICD-10-CM | POA: Diagnosis not present

## 2012-10-31 DIAGNOSIS — M7020 Olecranon bursitis, unspecified elbow: Secondary | ICD-10-CM | POA: Diagnosis not present

## 2012-10-31 DIAGNOSIS — M25529 Pain in unspecified elbow: Secondary | ICD-10-CM | POA: Diagnosis not present

## 2012-11-10 DIAGNOSIS — H40059 Ocular hypertension, unspecified eye: Secondary | ICD-10-CM | POA: Diagnosis not present

## 2012-11-10 DIAGNOSIS — H3530 Unspecified macular degeneration: Secondary | ICD-10-CM | POA: Diagnosis not present

## 2012-11-10 DIAGNOSIS — Q663 Other congenital varus deformities of feet, unspecified foot: Secondary | ICD-10-CM | POA: Diagnosis not present

## 2012-11-10 DIAGNOSIS — H01009 Unspecified blepharitis unspecified eye, unspecified eyelid: Secondary | ICD-10-CM | POA: Diagnosis not present

## 2012-12-19 ENCOUNTER — Other Ambulatory Visit: Payer: Self-pay | Admitting: Dermatology

## 2012-12-19 DIAGNOSIS — L570 Actinic keratosis: Secondary | ICD-10-CM | POA: Diagnosis not present

## 2012-12-19 DIAGNOSIS — L821 Other seborrheic keratosis: Secondary | ICD-10-CM | POA: Diagnosis not present

## 2012-12-19 DIAGNOSIS — L819 Disorder of pigmentation, unspecified: Secondary | ICD-10-CM | POA: Diagnosis not present

## 2012-12-19 DIAGNOSIS — D485 Neoplasm of uncertain behavior of skin: Secondary | ICD-10-CM | POA: Diagnosis not present

## 2012-12-19 DIAGNOSIS — Z85820 Personal history of malignant melanoma of skin: Secondary | ICD-10-CM | POA: Diagnosis not present

## 2012-12-19 DIAGNOSIS — Z85828 Personal history of other malignant neoplasm of skin: Secondary | ICD-10-CM | POA: Diagnosis not present

## 2012-12-19 DIAGNOSIS — L820 Inflamed seborrheic keratosis: Secondary | ICD-10-CM | POA: Diagnosis not present

## 2012-12-19 DIAGNOSIS — C4441 Basal cell carcinoma of skin of scalp and neck: Secondary | ICD-10-CM | POA: Diagnosis not present

## 2013-01-22 ENCOUNTER — Other Ambulatory Visit: Payer: Self-pay | Admitting: Family Medicine

## 2013-01-23 ENCOUNTER — Telehealth: Payer: Self-pay | Admitting: Family

## 2013-01-23 ENCOUNTER — Ambulatory Visit (INDEPENDENT_AMBULATORY_CARE_PROVIDER_SITE_OTHER): Payer: Medicare Other | Admitting: Family

## 2013-01-23 DIAGNOSIS — I2699 Other pulmonary embolism without acute cor pulmonale: Secondary | ICD-10-CM | POA: Diagnosis not present

## 2013-01-23 DIAGNOSIS — Z862 Personal history of diseases of the blood and blood-forming organs and certain disorders involving the immune mechanism: Secondary | ICD-10-CM

## 2013-01-23 MED ORDER — WARFARIN SODIUM 5 MG PO TABS: 5.0000 mg | ORAL_TABLET | Freq: Every day | ORAL | Status: DC

## 2013-01-23 NOTE — Telephone Encounter (Signed)
Patient needs an OV. Needs an INR. He has not been here in almost a year and is taking Coumadin

## 2013-01-23 NOTE — Telephone Encounter (Signed)
Appointment scheduled.

## 2013-01-23 NOTE — Patient Instructions (Addendum)
5mg  everyday except and 2.5mg  on Sunday and Wednesday. Recheck in 6 weeks.   Anticoagulation Dose Instructions as of 01/23/2013     Glynis Smiles Tue Wed Thu Fri Sat   New Dose 2.5 mg 5 mg 5 mg 2.5 mg 5 mg 5 mg 5 mg    Description       5mg  everyday except and 2.5mg  on Sunday and Wednesday. Recheck in 6 weeks.

## 2013-01-30 DIAGNOSIS — H5200 Hypermetropia, unspecified eye: Secondary | ICD-10-CM | POA: Diagnosis not present

## 2013-01-30 DIAGNOSIS — H2510 Age-related nuclear cataract, unspecified eye: Secondary | ICD-10-CM | POA: Diagnosis not present

## 2013-01-30 DIAGNOSIS — H3530 Unspecified macular degeneration: Secondary | ICD-10-CM | POA: Diagnosis not present

## 2013-01-30 DIAGNOSIS — H40059 Ocular hypertension, unspecified eye: Secondary | ICD-10-CM | POA: Diagnosis not present

## 2013-03-07 ENCOUNTER — Encounter: Payer: Medicare Other | Admitting: Family

## 2013-03-15 ENCOUNTER — Telehealth: Payer: Self-pay | Admitting: Family

## 2013-03-15 NOTE — Telephone Encounter (Signed)
Appointment scheduled for tomorrow.

## 2013-03-15 NOTE — Telephone Encounter (Signed)
Needs an INR asap, before July

## 2013-03-16 ENCOUNTER — Ambulatory Visit (INDEPENDENT_AMBULATORY_CARE_PROVIDER_SITE_OTHER): Payer: Medicare Other | Admitting: Family

## 2013-03-16 DIAGNOSIS — I2699 Other pulmonary embolism without acute cor pulmonale: Secondary | ICD-10-CM | POA: Diagnosis not present

## 2013-03-16 LAB — POCT INR: INR: 1.4

## 2013-03-16 NOTE — Patient Instructions (Signed)
Take an extra 1/2 tab today and tomorrow. 5mg  everyday except and 2.5mg  on Sunday and Wednesday.Recheck in 1 week.   Anticoagulation Dose Instructions as of 03/16/2013     Glynis Smiles Tue Wed Thu Fri Sat   New Dose 2.5 mg 5 mg 5 mg 2.5 mg 5 mg 5 mg 5 mg    Description       Take an extra 1/2 tab today and tomorrow. 5mg  everyday except and 2.5mg  on Sunday and Wednesday.Recheck in 1 week.

## 2013-03-19 ENCOUNTER — Emergency Department (HOSPITAL_COMMUNITY)
Admission: EM | Admit: 2013-03-19 | Discharge: 2013-03-19 | Disposition: A | Discharge status: Home / Self Care | Payer: Medicare Other | Attending: Emergency Medicine | Admitting: Emergency Medicine

## 2013-03-19 ENCOUNTER — Encounter (HOSPITAL_COMMUNITY): Payer: Self-pay | Admitting: *Deleted

## 2013-03-19 DIAGNOSIS — Z87891 Personal history of nicotine dependence: Secondary | ICD-10-CM | POA: Diagnosis not present

## 2013-03-19 DIAGNOSIS — Z859 Personal history of malignant neoplasm, unspecified: Secondary | ICD-10-CM | POA: Insufficient documentation

## 2013-03-19 DIAGNOSIS — Z7901 Long term (current) use of anticoagulants: Secondary | ICD-10-CM | POA: Insufficient documentation

## 2013-03-19 DIAGNOSIS — Y929 Unspecified place or not applicable: Secondary | ICD-10-CM | POA: Insufficient documentation

## 2013-03-19 DIAGNOSIS — L02219 Cutaneous abscess of trunk, unspecified: Secondary | ICD-10-CM | POA: Insufficient documentation

## 2013-03-19 DIAGNOSIS — Z8719 Personal history of other diseases of the digestive system: Secondary | ICD-10-CM | POA: Insufficient documentation

## 2013-03-19 DIAGNOSIS — Y939 Activity, unspecified: Secondary | ICD-10-CM | POA: Insufficient documentation

## 2013-03-19 DIAGNOSIS — S30860A Insect bite (nonvenomous) of lower back and pelvis, initial encounter: Secondary | ICD-10-CM | POA: Diagnosis not present

## 2013-03-19 DIAGNOSIS — Z862 Personal history of diseases of the blood and blood-forming organs and certain disorders involving the immune mechanism: Secondary | ICD-10-CM | POA: Diagnosis not present

## 2013-03-19 DIAGNOSIS — W57XXXA Bitten or stung by nonvenomous insect and other nonvenomous arthropods, initial encounter: Secondary | ICD-10-CM | POA: Insufficient documentation

## 2013-03-19 DIAGNOSIS — L089 Local infection of the skin and subcutaneous tissue, unspecified: Secondary | ICD-10-CM | POA: Diagnosis not present

## 2013-03-19 MED ORDER — LIDOCAINE-EPINEPHRINE 1 %-1:100000 IJ SOLN
20.0000 mL | Freq: Once | INTRAMUSCULAR | Status: AC
  Administered 2013-03-19: 20 mL

## 2013-03-19 MED ORDER — CEPHALEXIN 250 MG PO CAPS
500.0000 mg | ORAL_CAPSULE | Freq: Once | ORAL | Status: AC
  Administered 2013-03-19: 500 mg via ORAL
  Filled 2013-03-19: qty 2

## 2013-03-19 MED ORDER — CEPHALEXIN 500 MG PO CAPS: 500.0000 mg | ORAL_CAPSULE | Freq: Four times a day (QID) | ORAL | Status: DC

## 2013-03-19 MED ORDER — DOXYCYCLINE HYCLATE 100 MG PO TABS
200.0000 mg | ORAL_TABLET | Freq: Once | ORAL | Status: AC
  Administered 2013-03-19: 200 mg via ORAL
  Filled 2013-03-19: qty 2

## 2013-03-19 NOTE — ED Notes (Signed)
Pt was bitten by deer tick on left pubic area. Pt removed tick, but has since developed swelling and redness at the area of bite.

## 2013-03-19 NOTE — ED Notes (Signed)
Suture cart at bedside, PA at bedside.

## 2013-03-19 NOTE — ED Provider Notes (Signed)
History     CSN: [id number]  Arrival date & time 03/19/13  0623   First MD Initiated Contact with Patient 03/19/13 [phone]      Chief Complaint  Patient presents with  . tick bite     (Consider location/radiation/quality/duration/timing/severity/associated sxs/prior treatment) HPI  Donald Villegas is a 67 y.o. male complaining of inflamed area to right groin where he removed a tick approximately 2 days ago. Patient denies fever, myalgia, nausea vomiting, malaise, other rash. Patient had [facility] spotted fever remotely. Last tetanus booster was within the last 1-1/2 years.  Past Medical History  Diagnosis Date  . Clotting disorder   . Cancer   . GERD (gastroesophageal reflux disease)     Past Surgical History  Procedure Laterality Date  . Prostate surgery      No family history on file.  History  Substance Use Topics  . Smoking status: Former Games developer  . Smokeless tobacco: Not on file  . Alcohol Use: 4.2 oz/week    7 Shots of liquor per week      Review of Systems  Constitutional: Negative for fever.  Respiratory: Negative for shortness of breath.   Cardiovascular: Negative for chest pain.  Gastrointestinal: Negative for nausea, vomiting, abdominal pain and diarrhea.  Skin: Positive for wound.  All other systems reviewed and are negative.    Allergies  Review of patient's allergies indicates no known allergies.  Home Medications   Current Outpatient Rx  Name  Route  Sig  Dispense  Refill  . predniSONE (DELTASONE) 20 MG tablet               . timolol (TIMOPTIC-XR) 0.5 % ophthalmic gel-forming               . warfarin (COUMADIN) 5 MG tablet   Oral   Take 1 tablet (5 mg total) by mouth daily.   100 tablet   3   . warfarin (COUMADIN) 5 MG tablet   Oral   Take 1 tablet (5 mg total) by mouth daily.   90 tablet   0     BP 134/84  Pulse 69  Resp 16  SpO2 96%  Physical Exam  Nursing note and vitals reviewed. Constitutional: He is oriented  to person, place, and time. He appears well-developed and well-nourished. No distress.  HENT:  Head: Normocephalic.  Mouth/Throat: Oropharynx is clear and moist.  Eyes: Conjunctivae and EOM are normal. Pupils are equal, round, and reactive to light.  Cardiovascular: Normal rate.   Pulmonary/Chest: Effort normal and breath sounds normal. No stridor. No respiratory distress. He has no wheezes. He has no rales. He exhibits no tenderness.  Abdominal: Soft. Bowel sounds are normal.  Musculoskeletal: Normal range of motion.  Neurological: He is alert and oriented to person, place, and time.  Skin:     Psychiatric: He has a normal mood and affect.    ED Course  Procedures (including critical care time)  INCISION AND DRAINAGE Performed by: Wynetta Emery Consent: Verbal consent obtained. Risks and benefits: risks, benefits and alternatives were discussed Type: abscess  Body area: right groin  Anesthesia: local infiltration  Incision was made with a scalpel, 7mm.  Local anesthetic: lidocaine 2% with epinephrine  Anesthetic total: 3 ml  Scalpel dissection: No foreign body seen visually.   Drainage amount: scant   Packing material: none  Patient tolerance: Patient tolerated the procedure well with no immediate complications.    Labs Reviewed - No data to display No results  found.   1. Tick bite      MDM   Filed Vitals:   03/19/13 0628  BP: 134/84  Pulse: 69  Resp: 16  SpO2: 96%     Donald Villegas is a 67 y.o. male with inflammation to area where tick was removed several days ago. Incision made, no foreign bodies appreciated: I excised the general area of the scab. There is no purulent drainage.  Patient declines pain medication  Lyme prophylaxis given. The patient a prescription for Keflex and asked him to use it when necessary if he develops any worsening signs of infection.   Medications  lidocaine-EPINEPHrine (XYLOCAINE W/EPI) 2 %-1:100000 (with pres)  injection 20 mL (not administered)    The patient is hemodynamically stable, appropriate for, and amenable to, discharge at this time. Pt verbalized understanding and agrees with care plan. Outpatient follow-up and return precautions given.    New Prescriptions   CEPHALEXIN (KEFLEX) 500 MG CAPSULE    Take 1 capsule (500 mg total) by mouth 4 (four) times daily.           Wynetta Emery, PA-C 03/19/13 [phone]

## 2013-03-19 NOTE — ED Notes (Signed)
The pt removed a tick from his rt lower abd Friday and now the area is inflamned.  Temp not taken he was drinking coffee

## 2013-03-19 NOTE — ED Provider Notes (Signed)
Medical screening examination/treatment/procedure(s) were performed by non-physician practitioner and as supervising physician I was immediately available for consultation/collaboration.  Juliet Rude. Rubin Payor, MD 03/19/13 [phone]

## 2013-03-22 ENCOUNTER — Telehealth: Payer: Self-pay | Admitting: Internal Medicine

## 2013-03-22 NOTE — Telephone Encounter (Signed)
See note in pts wife's chart Donald Villegas DOB [birth year].

## 2013-03-23 ENCOUNTER — Ambulatory Visit (INDEPENDENT_AMBULATORY_CARE_PROVIDER_SITE_OTHER): Payer: Medicare Other | Admitting: Family

## 2013-03-23 ENCOUNTER — Encounter: Payer: Medicare Other | Admitting: Family

## 2013-03-23 DIAGNOSIS — I2699 Other pulmonary embolism without acute cor pulmonale: Secondary | ICD-10-CM | POA: Diagnosis not present

## 2013-03-23 LAB — POCT INR: INR: 2.9

## 2013-03-23 NOTE — Patient Instructions (Addendum)
5mg  everyday except and 2.5mg  on Sunday and Wednesday. Recheck in 3 weeks.   Anticoagulation Dose Instructions as of 03/23/2013     Glynis Smiles Tue Wed Thu Fri Sat   New Dose 2.5 mg 5 mg 5 mg 2.5 mg 5 mg 5 mg 5 mg    Description       5mg  everyday except and 2.5mg  on Sunday and Wednesday. Recheck in 3 weeks.

## 2013-03-27 ENCOUNTER — Encounter: Payer: Medicare Other | Admitting: Family

## 2013-04-13 ENCOUNTER — Ambulatory Visit (INDEPENDENT_AMBULATORY_CARE_PROVIDER_SITE_OTHER): Payer: Medicare Other | Admitting: Family

## 2013-04-13 DIAGNOSIS — I2699 Other pulmonary embolism without acute cor pulmonale: Secondary | ICD-10-CM

## 2013-04-13 LAB — POCT INR: INR: 2.7

## 2013-04-13 NOTE — Patient Instructions (Addendum)
5mg  everyday except and 2.5mg  on Sunday and Wednesday. Recheck in 4 weeks.   Anticoagulation Dose Instructions as of 04/13/2013     Donald Villegas Tue Wed Thu Fri Sat   New Dose 2.5 mg 5 mg 5 mg 2.5 mg 5 mg 5 mg 5 mg    Description       5mg  everyday except and 2.5mg  on Sunday and Wednesday. Recheck in 4 weeks.

## 2013-04-24 ENCOUNTER — Encounter: Payer: Medicare Other | Admitting: Family

## 2013-05-11 ENCOUNTER — Ambulatory Visit: Payer: Medicare Other

## 2013-05-11 ENCOUNTER — Ambulatory Visit (INDEPENDENT_AMBULATORY_CARE_PROVIDER_SITE_OTHER): Payer: Medicare Other | Admitting: General Practice

## 2013-05-11 ENCOUNTER — Encounter: Payer: Medicare Other | Admitting: Family

## 2013-05-11 ENCOUNTER — Other Ambulatory Visit: Payer: Self-pay | Admitting: General Practice

## 2013-05-11 DIAGNOSIS — I2699 Other pulmonary embolism without acute cor pulmonale: Secondary | ICD-10-CM | POA: Diagnosis not present

## 2013-05-11 LAB — POCT INR: INR: 2.1

## 2013-05-11 MED ORDER — WARFARIN SODIUM 5 MG PO TABS: ORAL_TABLET | ORAL | Status: DC

## 2013-06-08 ENCOUNTER — Ambulatory Visit (INDEPENDENT_AMBULATORY_CARE_PROVIDER_SITE_OTHER): Payer: Medicare Other | Admitting: General Practice

## 2013-06-08 DIAGNOSIS — I2699 Other pulmonary embolism without acute cor pulmonale: Secondary | ICD-10-CM

## 2013-06-08 LAB — POCT INR: INR: 1.6

## 2013-06-29 DIAGNOSIS — Z85820 Personal history of malignant melanoma of skin: Secondary | ICD-10-CM | POA: Diagnosis not present

## 2013-06-29 DIAGNOSIS — L570 Actinic keratosis: Secondary | ICD-10-CM | POA: Diagnosis not present

## 2013-06-29 DIAGNOSIS — L821 Other seborrheic keratosis: Secondary | ICD-10-CM | POA: Diagnosis not present

## 2013-06-29 DIAGNOSIS — Z85828 Personal history of other malignant neoplasm of skin: Secondary | ICD-10-CM | POA: Diagnosis not present

## 2013-06-29 DIAGNOSIS — L259 Unspecified contact dermatitis, unspecified cause: Secondary | ICD-10-CM | POA: Diagnosis not present

## 2013-06-29 DIAGNOSIS — L820 Inflamed seborrheic keratosis: Secondary | ICD-10-CM | POA: Diagnosis not present

## 2013-06-29 DIAGNOSIS — T148 Other injury of unspecified body region: Secondary | ICD-10-CM | POA: Diagnosis not present

## 2013-07-03 DIAGNOSIS — H04129 Dry eye syndrome of unspecified lacrimal gland: Secondary | ICD-10-CM | POA: Diagnosis not present

## 2013-07-03 DIAGNOSIS — H01009 Unspecified blepharitis unspecified eye, unspecified eyelid: Secondary | ICD-10-CM | POA: Diagnosis not present

## 2013-07-03 DIAGNOSIS — H3530 Unspecified macular degeneration: Secondary | ICD-10-CM | POA: Diagnosis not present

## 2013-07-03 DIAGNOSIS — H40059 Ocular hypertension, unspecified eye: Secondary | ICD-10-CM | POA: Diagnosis not present

## 2013-07-06 ENCOUNTER — Ambulatory Visit (INDEPENDENT_AMBULATORY_CARE_PROVIDER_SITE_OTHER): Payer: Medicare Other | Admitting: General Practice

## 2013-07-06 DIAGNOSIS — I2699 Other pulmonary embolism without acute cor pulmonale: Secondary | ICD-10-CM | POA: Diagnosis not present

## 2013-07-06 LAB — POCT INR: INR: 2.5

## 2013-08-03 ENCOUNTER — Ambulatory Visit (INDEPENDENT_AMBULATORY_CARE_PROVIDER_SITE_OTHER): Payer: Medicare Other | Admitting: General Practice

## 2013-08-03 DIAGNOSIS — I2699 Other pulmonary embolism without acute cor pulmonale: Secondary | ICD-10-CM | POA: Diagnosis not present

## 2013-08-15 ENCOUNTER — Ambulatory Visit (INDEPENDENT_AMBULATORY_CARE_PROVIDER_SITE_OTHER): Payer: Medicare Other

## 2013-08-15 DIAGNOSIS — Z23 Encounter for immunization: Secondary | ICD-10-CM | POA: Diagnosis not present

## 2013-09-18 ENCOUNTER — Other Ambulatory Visit: Payer: Self-pay | Admitting: Family Medicine

## 2013-09-19 ENCOUNTER — Other Ambulatory Visit: Payer: Self-pay | Admitting: General Practice

## 2013-09-19 MED ORDER — WARFARIN SODIUM 5 MG PO TABS: ORAL_TABLET | ORAL | Status: DC

## 2013-09-21 ENCOUNTER — Ambulatory Visit (INDEPENDENT_AMBULATORY_CARE_PROVIDER_SITE_OTHER): Payer: Medicare Other | Admitting: General Practice

## 2013-09-21 DIAGNOSIS — I2699 Other pulmonary embolism without acute cor pulmonale: Secondary | ICD-10-CM | POA: Diagnosis not present

## 2013-09-21 NOTE — Progress Notes (Signed)
Pre-visit discussion using our clinic review tool. No additional management support is needed unless otherwise documented below in the visit note.  

## 2013-10-30 DIAGNOSIS — C61 Malignant neoplasm of prostate: Secondary | ICD-10-CM | POA: Diagnosis not present

## 2013-10-31 DIAGNOSIS — M5030 Other cervical disc degeneration, unspecified cervical region: Secondary | ICD-10-CM | POA: Diagnosis not present

## 2013-11-02 ENCOUNTER — Ambulatory Visit (INDEPENDENT_AMBULATORY_CARE_PROVIDER_SITE_OTHER): Payer: Medicare Other | Admitting: General Practice

## 2013-11-02 DIAGNOSIS — I2699 Other pulmonary embolism without acute cor pulmonale: Secondary | ICD-10-CM | POA: Diagnosis not present

## 2013-11-02 LAB — POCT INR: INR: 2.5

## 2013-11-02 NOTE — Progress Notes (Signed)
Pre-visit discussion using our clinic review tool. No additional management support is needed unless otherwise documented below in the visit note.  

## 2013-11-10 DIAGNOSIS — C61 Malignant neoplasm of prostate: Secondary | ICD-10-CM | POA: Diagnosis not present

## 2013-11-23 DIAGNOSIS — C61 Malignant neoplasm of prostate: Secondary | ICD-10-CM | POA: Diagnosis not present

## 2013-11-27 ENCOUNTER — Encounter: Payer: Self-pay | Admitting: Radiation Oncology

## 2013-11-27 DIAGNOSIS — C61 Malignant neoplasm of prostate: Secondary | ICD-10-CM | POA: Insufficient documentation

## 2013-11-27 NOTE — Progress Notes (Signed)
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 no  Pacemaker/ICD? no  Possible current pregnancy? na  Is the patient on methotrexate? no  Current Complaints / other details:  Married, works part time in [employer] to discuss salvage radiation therapy.

## 2013-11-28 ENCOUNTER — Ambulatory Visit
Admission: RE | Admit: 2013-11-28 | Discharge: 2013-11-28 | Disposition: A | Discharge status: Home / Self Care | Payer: Medicare Other | Source: Ambulatory Visit | Attending: Radiation Oncology | Admitting: Radiation Oncology

## 2013-11-28 ENCOUNTER — Encounter: Payer: Self-pay | Admitting: Radiation Oncology

## 2013-11-28 VITALS — BP 148/86 | HR 65 | Temp 98.5°F | Resp 20 | Wt 189.4 lb

## 2013-11-28 DIAGNOSIS — Z86711 Personal history of pulmonary embolism: Secondary | ICD-10-CM | POA: Diagnosis not present

## 2013-11-28 DIAGNOSIS — K219 Gastro-esophageal reflux disease without esophagitis: Secondary | ICD-10-CM | POA: Diagnosis not present

## 2013-11-28 DIAGNOSIS — C61 Malignant neoplasm of prostate: Secondary | ICD-10-CM | POA: Diagnosis not present

## 2013-11-28 DIAGNOSIS — Z9079 Acquired absence of other genital organ(s): Secondary | ICD-10-CM | POA: Insufficient documentation

## 2013-11-28 DIAGNOSIS — N529 Male erectile dysfunction, unspecified: Secondary | ICD-10-CM | POA: Insufficient documentation

## 2013-11-28 DIAGNOSIS — Z87891 Personal history of nicotine dependence: Secondary | ICD-10-CM | POA: Diagnosis not present

## 2013-11-28 DIAGNOSIS — Z85828 Personal history of other malignant neoplasm of skin: Secondary | ICD-10-CM | POA: Diagnosis not present

## 2013-11-28 DIAGNOSIS — Z7901 Long term (current) use of anticoagulants: Secondary | ICD-10-CM | POA: Diagnosis not present

## 2013-11-28 HISTORY — DX: Pneumonia, unspecified organism: J18.9

## 2013-11-28 HISTORY — DX: Malignant neoplasm of prostate: C61

## 2013-11-28 HISTORY — DX: Unspecified malignant neoplasm of skin, unspecified: C44.90

## 2013-11-28 HISTORY — DX: Spotted fever due to Rickettsia rickettsii: A77.0

## 2013-11-28 HISTORY — DX: Other pulmonary embolism without acute cor pulmonale: I26.99

## 2013-11-28 NOTE — Progress Notes (Signed)
[facility] NEW PATIENT EVALUATION  Name: Donald Villegas MRN: [id number]  Date:   11/28/2013           DOB: Dec 07, 1945  Status: outpatient   CC: TODD,JEFFREY Zenia Resides, MD  Dorthy Cooler, MD , Dr. Ardath Sax [facility] Department of Urology   REFERRING PHYSICIAN: Dorthy Cooler, MD   DIAGNOSIS: Carcinoma prostate, PSA/biochemical recurrence   HISTORY OF PRESENT ILLNESS:  Donald Villegas is a 68 y.o. male who is seen today for the courtesy of Dr. Mina Marble of the Department of radiation oncology at [facility] for consideration of salvage radiation therapy in the management of his PSA recurrent carcinoma the prostate. 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 His PSA at that time was 7.1. He had Gleason score 6 (3+3) involving the right lateral base right lateral apex, right apex of the left lateral mid gland, left mid gland and left lateral apex. His PSA was  8.3. He underwent a robotic nerve sparing radical prostatectomy by Dr.Pruthi at [facility] on 11/08/2009. On review of his pathology he was found have Gleason 7 (3+4) with a minor tertiary component of pattern 5. He had tumor involvement of the left lobe apex to base and right lobe apex to mid gland. There is no obvious tumor through the prostatic capsule. However, the left apical margin was focally positive. Seminal vesicles were uninvolved. Lymph nodes were not submitted. He was given a pathologic stage of pT2cNX. His postoperative PSAs were undetectable until elevation of 0.1 on 10/28/2012 that rising to  0.4 on 10/30/2013. He was seen by Dr. Mina Marble of the Department of radiation oncology at [facility] and he recommended salvage radiation therapy. He is seen by me today to have treatment closer to home. He is doing well from a GU and GI standpoint. He is completely dry. He does have erectile dysfunction with some marginal improvement with  Viagra.  PREVIOUS RADIATION THERAPY: No   PAST MEDICAL HISTORY:  has a past medical history of Clotting disorder; Cancer; GERD (gastroesophageal reflux disease); Prostate cancer (2011); Skin cancer; Pulmonary embolism (2001, 2011); [place] spotted fever (2006); and Pneumonia (2012).     PAST SURGICAL HISTORY:  Past Surgical History  Procedure Laterality Date  . Hernia repair Right 2012  . Prostatectomy  11/08/09    gleason 6  . Prostate biopsy  01/28/2009    gleason 3+3=6  . Knee arthroscopy Left 2010  . Melanoma excision Left 2000     FAMILY HISTORY: family history includes Cancer (age of onset: 39) in his paternal grandmother; Cancer (age of onset: 80) in his paternal grandfather.    SOCIAL HISTORY:  reports that he quit smoking about 45 years ago. He has never used smokeless tobacco. He reports that he drinks about 4.2 ounces of alcohol per week. He reports that he does not use illicit drugs.  married, 2 children. He is in the Veterinary surgeon business.   ALLERGIES: Review of patient's allergies indicates no known allergies.   MEDICATIONS:  Current Outpatient Prescriptions  Medication Sig Dispense Refill  . famotidine (PEPCID) 20 MG tablet Take 20 mg by mouth daily.      . predniSONE (DELTASONE) 20 MG tablet Take 10-40 mg by mouth daily as needed (for skin flare ups, self titrtates a 3 day treatment).       . timolol (TIMOPTIC-XR) 0.5 % ophthalmic gel-forming Place 1  drop into both eyes once a week.       . warfarin (COUMADIN) 5 MG tablet Take as directed by anticoagulation clinic.  90 tablet  0   No current facility-administered medications for this encounter.     REVIEW OF SYSTEMS:  Pertinent items are noted in HPI.    PHYSICAL EXAM:  weight is 189 lb 6.4 oz (85.911 kg). His oral temperature is 98.5 F (36.9 C). His blood pressure is 148/86 and his pulse is 65. His respiration is 20.   Alert and oriented. Rectal examination: Prostate bed is flat and is without  worrisome nodularity or masses.   LABORATORY DATA:  Lab Results  Component Value Date   WBC 5.5 07/28/2011   HGB 15.9 07/28/2011   HCT 47.7 07/28/2011   MCV 98.6 07/28/2011   PLT 196.0 07/28/2011   Lab Results  Component Value Date   NA 141 07/28/2011   K 4.7 07/28/2011   CL 104 07/28/2011   CO2 31 07/28/2011   Lab Results  Component Value Date   ALT 24 07/28/2011   AST 25 07/28/2011   ALKPHOS 56 07/28/2011   BILITOT 0.8 07/28/2011   PSA 0.4 from 10/30/2013   IMPRESSION: PSA recurrent carcinoma the prostate. I explained to Donald Villegas that clinical predictors for a local recurrence alone, and thus possible salvage with radiation therapy include a positive surgical margin, and a significant disease-free interval. Other prognostic factors include pathologic T stage,  presenting Gleason score, and PSA level at presentation. His positive surgical margin at the apex is the strong predictor for local recurrence. He did have Gleason 7 with a tertiary component of pattern 5 which could predict for increased risk for regional or distant spread. He is an excellent candidate for salvage radiation therapy. We discussed a "window of opportunity" of starting treatment before his PSA rises above 0.4- 0.5 We discussed the potential acute and late toxicities of radiation therapy. He'll be treated with IMRT/Tomotherapy delivering 6600 cGy in 33 sessions. We also discussed treatment with a comfortably full bladder. Consent is signed today.   PLAN: As discussed above. He'll return for simulation/treatment planning next week.  I spent 60 minutes minutes face to face with the patient and more than 50% of that time was spent in counseling and/or coordination of care.

## 2013-11-28 NOTE — Progress Notes (Signed)
Please see the Nurse Progress Note in the MD Initial Consult Encounter for this patient. 

## 2013-12-01 ENCOUNTER — Encounter: Payer: Self-pay | Admitting: *Deleted

## 2013-12-01 NOTE — Progress Notes (Signed)
[facility] Psychosocial Distress Screening Clinical Social Work  Clinical Social Work was referred by distress screening protocol.  The patient scored a 5 on the Psychosocial Distress Thermometer which indicates moderate distress. Clinical Social Worker phoned Pt to assess for distress and other psychosocial needs. CSW left vm for Pt to follow up with CSW as needed with contact information.    Clinical Social Worker follow up needed: no  Loren Racer, [place] Social Worker [place] [place] [place] for [place] Wednesday, Thursday and Friday Phone: [phone] Fax: [phone]

## 2013-12-05 ENCOUNTER — Ambulatory Visit
Admission: RE | Admit: 2013-12-05 | Discharge: 2013-12-05 | Disposition: A | Discharge status: Home / Self Care | Payer: Medicare Other | Source: Ambulatory Visit | Attending: Radiation Oncology | Admitting: Radiation Oncology

## 2013-12-05 DIAGNOSIS — R351 Nocturia: Secondary | ICD-10-CM | POA: Insufficient documentation

## 2013-12-05 DIAGNOSIS — C61 Malignant neoplasm of prostate: Secondary | ICD-10-CM | POA: Diagnosis not present

## 2013-12-05 DIAGNOSIS — Z510 Encounter for antineoplastic radiation therapy: Secondary | ICD-10-CM | POA: Insufficient documentation

## 2013-12-05 NOTE — Progress Notes (Signed)
Complex simulation/treatment planning note: The patient was taken to the CT simulator. He was placed supine. A custom vac lock immobilization device was constructed. A rubber catheter was placed within the rectal vault. He was then catheterized and contrast instilled into the urethra. The CT data set was sent for treatment planning system for contouring of his prostate bed (CTV 66) which was an expanded by 0.5 cm to create PTV 66. Also contoured his bladder number rectum, and rectosigmoid colon. I am prescribing 6600 cGy in 33 sessions with helical IMRT Tomotherapy. He'll undergo daily MV CT setting up to his anterior rectum/prostate bed interface. He is to be treated with a comfortably full bladder.

## 2013-12-07 ENCOUNTER — Encounter: Payer: Self-pay | Admitting: Radiation Oncology

## 2013-12-07 DIAGNOSIS — C61 Malignant neoplasm of prostate: Secondary | ICD-10-CM | POA: Diagnosis not present

## 2013-12-07 DIAGNOSIS — Z510 Encounter for antineoplastic radiation therapy: Secondary | ICD-10-CM | POA: Diagnosis not present

## 2013-12-07 DIAGNOSIS — R351 Nocturia: Secondary | ICD-10-CM | POA: Diagnosis not present

## 2013-12-07 NOTE — Progress Notes (Signed)
IMRT simulation/treatment planning note: The patient completed IMRT simulation/treatment planning in the management of his PSA recurrent carcinoma the prostate. IMRT was chosen to decrease the risk for both acute and late bladder and rectal toxicity compared to 3-D conformal or conventional radiation therapy. Dose volume histograms were obtained for the target structures including the prostate bed PTV and avoidance structures including the bladder, rectum, and femoral heads. We met our departmental goals.. I prescribing 6006 cGy in 33 sessions utilizing 6 MV helical IMRT Tomotherapy. I requesting daily image guidance with MV CT setting up to his anterior rectal wall.

## 2013-12-08 DIAGNOSIS — H01009 Unspecified blepharitis unspecified eye, unspecified eyelid: Secondary | ICD-10-CM | POA: Diagnosis not present

## 2013-12-08 DIAGNOSIS — H40059 Ocular hypertension, unspecified eye: Secondary | ICD-10-CM | POA: Diagnosis not present

## 2013-12-08 DIAGNOSIS — H40019 Open angle with borderline findings, low risk, unspecified eye: Secondary | ICD-10-CM | POA: Diagnosis not present

## 2013-12-08 DIAGNOSIS — H259 Unspecified age-related cataract: Secondary | ICD-10-CM | POA: Diagnosis not present

## 2013-12-10 DIAGNOSIS — C61 Malignant neoplasm of prostate: Secondary | ICD-10-CM | POA: Diagnosis not present

## 2013-12-10 DIAGNOSIS — R351 Nocturia: Secondary | ICD-10-CM | POA: Diagnosis not present

## 2013-12-10 DIAGNOSIS — Z510 Encounter for antineoplastic radiation therapy: Secondary | ICD-10-CM | POA: Diagnosis not present

## 2013-12-14 ENCOUNTER — Ambulatory Visit
Admission: RE | Admit: 2013-12-14 | Discharge: 2013-12-14 | Disposition: A | Discharge status: Home / Self Care | Payer: Medicare Other | Source: Ambulatory Visit | Attending: Radiation Oncology | Admitting: Radiation Oncology

## 2013-12-14 ENCOUNTER — Ambulatory Visit: Payer: Medicare Other

## 2013-12-14 DIAGNOSIS — Z510 Encounter for antineoplastic radiation therapy: Secondary | ICD-10-CM | POA: Diagnosis not present

## 2013-12-14 DIAGNOSIS — C61 Malignant neoplasm of prostate: Secondary | ICD-10-CM | POA: Diagnosis not present

## 2013-12-14 DIAGNOSIS — R351 Nocturia: Secondary | ICD-10-CM | POA: Diagnosis not present

## 2013-12-14 NOTE — Progress Notes (Signed)
Chart note: The patient underwent his first treatment to his prostate bed. He is being treated with Tomotherapy. He is being treated to 5.1 delivered field widths corresponding to one set of IMRT treatment devices, [id number].

## 2013-12-15 ENCOUNTER — Ambulatory Visit
Admission: RE | Admit: 2013-12-15 | Discharge: 2013-12-15 | Disposition: A | Discharge status: Home / Self Care | Payer: Medicare Other | Source: Ambulatory Visit | Attending: Radiation Oncology | Admitting: Radiation Oncology

## 2013-12-15 DIAGNOSIS — C61 Malignant neoplasm of prostate: Secondary | ICD-10-CM | POA: Diagnosis not present

## 2013-12-15 DIAGNOSIS — Z510 Encounter for antineoplastic radiation therapy: Secondary | ICD-10-CM | POA: Diagnosis not present

## 2013-12-15 DIAGNOSIS — R351 Nocturia: Secondary | ICD-10-CM | POA: Diagnosis not present

## 2013-12-18 ENCOUNTER — Ambulatory Visit
Admission: RE | Admit: 2013-12-18 | Discharge: 2013-12-18 | Disposition: A | Discharge status: Home / Self Care | Payer: Medicare Other | Source: Ambulatory Visit | Attending: Radiation Oncology | Admitting: Radiation Oncology

## 2013-12-18 ENCOUNTER — Encounter: Payer: Self-pay | Admitting: Radiation Oncology

## 2013-12-18 VITALS — BP 132/80 | HR 61 | Temp 98.6°F | Resp 20 | Wt 191.7 lb

## 2013-12-18 DIAGNOSIS — C61 Malignant neoplasm of prostate: Secondary | ICD-10-CM | POA: Diagnosis not present

## 2013-12-18 DIAGNOSIS — R351 Nocturia: Secondary | ICD-10-CM | POA: Diagnosis not present

## 2013-12-18 DIAGNOSIS — Z510 Encounter for antineoplastic radiation therapy: Secondary | ICD-10-CM | POA: Diagnosis not present

## 2013-12-18 NOTE — Progress Notes (Addendum)
Pt denies pain, fatigue, loss of appetite, urinary/bowel issues. Post sim ed completed w/pt. Gave pt "Radiation and You" booklet w/all pertinent information marked and discussed, re: fatigue, rectal irritation/care, urinary issues/management, nutrition, pain. All questions answered. Pt verbalized understanding.

## 2013-12-18 NOTE — Progress Notes (Signed)
Weekly Management Note:  Site: Prostate bed Current Dose:  500  cGy Projected Dose: 6600  cGy  Narrative: The patient is seen today for routine under treatment assessment. CBCT/MVCT images/port films were reviewed. The chart was reviewed.   Bladder filling is satisfactory.  Physical Examination:  Filed Vitals:   12/18/13 0855  BP: 132/80  Pulse: 61  Temp: 98.6 F (37 C)  Resp: 20  .  Weight: 191 lb 11.2 oz (86.955 kg). No change.  Impression: Tolerating radiation therapy well.  Plan: Continue radiation therapy as planned.

## 2013-12-19 ENCOUNTER — Ambulatory Visit
Admission: RE | Admit: 2013-12-19 | Discharge: 2013-12-19 | Disposition: A | Discharge status: Home / Self Care | Payer: Medicare Other | Source: Ambulatory Visit | Attending: Radiation Oncology | Admitting: Radiation Oncology

## 2013-12-19 DIAGNOSIS — Z510 Encounter for antineoplastic radiation therapy: Secondary | ICD-10-CM | POA: Diagnosis not present

## 2013-12-19 DIAGNOSIS — R351 Nocturia: Secondary | ICD-10-CM | POA: Diagnosis not present

## 2013-12-19 DIAGNOSIS — C61 Malignant neoplasm of prostate: Secondary | ICD-10-CM | POA: Diagnosis not present

## 2013-12-20 ENCOUNTER — Ambulatory Visit
Admission: RE | Admit: 2013-12-20 | Discharge: 2013-12-20 | Disposition: A | Discharge status: Home / Self Care | Payer: Medicare Other | Source: Ambulatory Visit | Attending: Radiation Oncology | Admitting: Radiation Oncology

## 2013-12-20 DIAGNOSIS — C61 Malignant neoplasm of prostate: Secondary | ICD-10-CM | POA: Diagnosis not present

## 2013-12-20 DIAGNOSIS — Z510 Encounter for antineoplastic radiation therapy: Secondary | ICD-10-CM | POA: Diagnosis not present

## 2013-12-20 DIAGNOSIS — R351 Nocturia: Secondary | ICD-10-CM | POA: Diagnosis not present

## 2013-12-21 ENCOUNTER — Ambulatory Visit
Admission: RE | Admit: 2013-12-21 | Discharge: 2013-12-21 | Disposition: A | Discharge status: Home / Self Care | Payer: Medicare Other | Source: Ambulatory Visit | Attending: Radiation Oncology | Admitting: Radiation Oncology

## 2013-12-21 DIAGNOSIS — R351 Nocturia: Secondary | ICD-10-CM | POA: Diagnosis not present

## 2013-12-21 DIAGNOSIS — C61 Malignant neoplasm of prostate: Secondary | ICD-10-CM | POA: Diagnosis not present

## 2013-12-21 DIAGNOSIS — Z510 Encounter for antineoplastic radiation therapy: Secondary | ICD-10-CM | POA: Diagnosis not present

## 2013-12-22 ENCOUNTER — Ambulatory Visit
Admission: RE | Admit: 2013-12-22 | Discharge: 2013-12-22 | Disposition: A | Discharge status: Home / Self Care | Payer: Medicare Other | Source: Ambulatory Visit | Attending: Radiation Oncology | Admitting: Radiation Oncology

## 2013-12-22 DIAGNOSIS — Z510 Encounter for antineoplastic radiation therapy: Secondary | ICD-10-CM | POA: Diagnosis not present

## 2013-12-22 DIAGNOSIS — C61 Malignant neoplasm of prostate: Secondary | ICD-10-CM | POA: Diagnosis not present

## 2013-12-22 DIAGNOSIS — R351 Nocturia: Secondary | ICD-10-CM | POA: Diagnosis not present

## 2013-12-25 ENCOUNTER — Ambulatory Visit
Admission: RE | Admit: 2013-12-25 | Discharge: 2013-12-25 | Disposition: A | Discharge status: Home / Self Care | Payer: Medicare Other | Source: Ambulatory Visit | Attending: Radiation Oncology | Admitting: Radiation Oncology

## 2013-12-25 ENCOUNTER — Encounter: Payer: Self-pay | Admitting: Radiation Oncology

## 2013-12-25 VITALS — BP 122/77 | HR 66 | Temp 97.8°F | Resp 20 | Wt 188.7 lb

## 2013-12-25 DIAGNOSIS — Z510 Encounter for antineoplastic radiation therapy: Secondary | ICD-10-CM | POA: Diagnosis not present

## 2013-12-25 DIAGNOSIS — R351 Nocturia: Secondary | ICD-10-CM | POA: Diagnosis not present

## 2013-12-25 DIAGNOSIS — C61 Malignant neoplasm of prostate: Secondary | ICD-10-CM | POA: Diagnosis not present

## 2013-12-25 NOTE — Progress Notes (Signed)
Weekly rad tx prostate 8/33 completed, no dysuria, hematuria, "there's a change in bowels " stted patient stomach upset,  No nausea 8:52 AM

## 2013-12-25 NOTE — Progress Notes (Signed)
Weekly Management Note:  Site: Prostate bed Current Dose:  1600  cGy Projected Dose: 6600  cGy  Narrative: The patient is seen today for routine under treatment assessment. CBCT/MVCT images/port films were reviewed. The chart was reviewed.   Bladder filling appears to be satisfactory need to scan more superiorly tomorrow to confirm. He does have slight loosening of his bowels, not thought to be secondary to radiation therapy. No GU difficulties.  Physical Examination:  Filed Vitals:   12/25/13 0851  BP: 122/77  Pulse: 66  Temp: 97.8 F (36.6 C)  Resp: 20  .  Weight: 188 lb 11.2 oz (85.594 kg). No change.  Impression: Tolerating radiation therapy well.  Plan: Continue radiation therapy as planned.

## 2013-12-26 ENCOUNTER — Ambulatory Visit
Admission: RE | Admit: 2013-12-26 | Discharge: 2013-12-26 | Disposition: A | Discharge status: Home / Self Care | Payer: Medicare Other | Source: Ambulatory Visit | Attending: Radiation Oncology | Admitting: Radiation Oncology

## 2013-12-26 ENCOUNTER — Ambulatory Visit (INDEPENDENT_AMBULATORY_CARE_PROVIDER_SITE_OTHER): Payer: Medicare Other | Admitting: General Practice

## 2013-12-26 DIAGNOSIS — I2699 Other pulmonary embolism without acute cor pulmonale: Secondary | ICD-10-CM | POA: Diagnosis not present

## 2013-12-26 DIAGNOSIS — C61 Malignant neoplasm of prostate: Secondary | ICD-10-CM | POA: Diagnosis not present

## 2013-12-26 DIAGNOSIS — Z510 Encounter for antineoplastic radiation therapy: Secondary | ICD-10-CM | POA: Diagnosis not present

## 2013-12-26 DIAGNOSIS — Z5181 Encounter for therapeutic drug level monitoring: Secondary | ICD-10-CM | POA: Diagnosis not present

## 2013-12-26 DIAGNOSIS — R351 Nocturia: Secondary | ICD-10-CM | POA: Diagnosis not present

## 2013-12-26 LAB — POCT INR: INR: 2.1

## 2013-12-26 NOTE — Progress Notes (Signed)
Pre visit review using our clinic review tool, if applicable. No additional management support is needed unless otherwise documented below in the visit note. 

## 2013-12-27 ENCOUNTER — Ambulatory Visit
Admission: RE | Admit: 2013-12-27 | Discharge: 2013-12-27 | Disposition: A | Discharge status: Home / Self Care | Payer: Medicare Other | Source: Ambulatory Visit | Attending: Radiation Oncology | Admitting: Radiation Oncology

## 2013-12-27 DIAGNOSIS — C61 Malignant neoplasm of prostate: Secondary | ICD-10-CM | POA: Diagnosis not present

## 2013-12-27 DIAGNOSIS — Z510 Encounter for antineoplastic radiation therapy: Secondary | ICD-10-CM | POA: Diagnosis not present

## 2013-12-27 DIAGNOSIS — R351 Nocturia: Secondary | ICD-10-CM | POA: Diagnosis not present

## 2013-12-28 ENCOUNTER — Other Ambulatory Visit: Payer: Self-pay | Admitting: Dermatology

## 2013-12-28 ENCOUNTER — Ambulatory Visit
Admission: RE | Admit: 2013-12-28 | Discharge: 2013-12-28 | Disposition: A | Discharge status: Home / Self Care | Payer: Medicare Other | Source: Ambulatory Visit | Attending: Radiation Oncology | Admitting: Radiation Oncology

## 2013-12-28 DIAGNOSIS — R351 Nocturia: Secondary | ICD-10-CM | POA: Diagnosis not present

## 2013-12-28 DIAGNOSIS — C61 Malignant neoplasm of prostate: Secondary | ICD-10-CM | POA: Diagnosis not present

## 2013-12-28 DIAGNOSIS — Z85820 Personal history of malignant melanoma of skin: Secondary | ICD-10-CM | POA: Diagnosis not present

## 2013-12-28 DIAGNOSIS — Z510 Encounter for antineoplastic radiation therapy: Secondary | ICD-10-CM | POA: Diagnosis not present

## 2013-12-28 DIAGNOSIS — D485 Neoplasm of uncertain behavior of skin: Secondary | ICD-10-CM | POA: Diagnosis not present

## 2013-12-28 DIAGNOSIS — L570 Actinic keratosis: Secondary | ICD-10-CM | POA: Diagnosis not present

## 2013-12-28 DIAGNOSIS — Z85828 Personal history of other malignant neoplasm of skin: Secondary | ICD-10-CM | POA: Diagnosis not present

## 2013-12-28 DIAGNOSIS — L821 Other seborrheic keratosis: Secondary | ICD-10-CM | POA: Diagnosis not present

## 2013-12-28 DIAGNOSIS — D0460 Carcinoma in situ of skin of unspecified upper limb, including shoulder: Secondary | ICD-10-CM | POA: Diagnosis not present

## 2013-12-29 ENCOUNTER — Ambulatory Visit
Admission: RE | Admit: 2013-12-29 | Discharge: 2013-12-29 | Disposition: A | Discharge status: Home / Self Care | Payer: Medicare Other | Source: Ambulatory Visit | Attending: Radiation Oncology | Admitting: Radiation Oncology

## 2013-12-29 DIAGNOSIS — C61 Malignant neoplasm of prostate: Secondary | ICD-10-CM | POA: Diagnosis not present

## 2013-12-29 DIAGNOSIS — Z510 Encounter for antineoplastic radiation therapy: Secondary | ICD-10-CM | POA: Diagnosis not present

## 2013-12-29 DIAGNOSIS — R351 Nocturia: Secondary | ICD-10-CM | POA: Diagnosis not present

## 2014-01-01 ENCOUNTER — Ambulatory Visit
Admission: RE | Admit: 2014-01-01 | Discharge: 2014-01-01 | Disposition: A | Discharge status: Home / Self Care | Payer: Medicare Other | Source: Ambulatory Visit | Attending: Radiation Oncology | Admitting: Radiation Oncology

## 2014-01-01 ENCOUNTER — Encounter: Payer: Self-pay | Admitting: Radiation Oncology

## 2014-01-01 ENCOUNTER — Other Ambulatory Visit: Payer: Self-pay | Admitting: Family Medicine

## 2014-01-01 ENCOUNTER — Other Ambulatory Visit: Payer: Self-pay | Admitting: General Practice

## 2014-01-01 VITALS — BP 133/82 | HR 50 | Temp 98.5°F | Resp 20 | Wt 189.5 lb

## 2014-01-01 DIAGNOSIS — Z510 Encounter for antineoplastic radiation therapy: Secondary | ICD-10-CM | POA: Diagnosis not present

## 2014-01-01 DIAGNOSIS — C61 Malignant neoplasm of prostate: Secondary | ICD-10-CM

## 2014-01-01 DIAGNOSIS — R351 Nocturia: Secondary | ICD-10-CM | POA: Diagnosis not present

## 2014-01-01 NOTE — Progress Notes (Signed)
Pt denies pain, urinary/bowel issues, loss of appetite. He has slight fatigue.

## 2014-01-01 NOTE — Progress Notes (Signed)
Weekly Management Note:  Site: Prostate bed Current Dose:  2600  cGy Projected Dose: 6600  cGy  Narrative: The patient is seen today for routine under treatment assessment. CBCT/MVCT images/port films were reviewed. The chart was reviewed.   Bladder filling is fair, but I think can improve. No GU or GI difficulties. He does have mild fatigue.  Physical Examination:  Filed Vitals:   01/01/14 0903  BP: 133/82  Pulse: 50  Temp: 98.5 F (36.9 C)  Resp: 20  .  Weight: 189 lb 8 oz (85.957 kg). No change.  Impression: Tolerating radiation therapy well.  Plan: Continue radiation therapy as planned.

## 2014-01-02 ENCOUNTER — Ambulatory Visit
Admission: RE | Admit: 2014-01-02 | Discharge: 2014-01-02 | Disposition: A | Discharge status: Home / Self Care | Payer: Medicare Other | Source: Ambulatory Visit | Attending: Radiation Oncology | Admitting: Radiation Oncology

## 2014-01-02 DIAGNOSIS — Z510 Encounter for antineoplastic radiation therapy: Secondary | ICD-10-CM | POA: Diagnosis not present

## 2014-01-02 DIAGNOSIS — C61 Malignant neoplasm of prostate: Secondary | ICD-10-CM | POA: Diagnosis not present

## 2014-01-02 DIAGNOSIS — R351 Nocturia: Secondary | ICD-10-CM | POA: Diagnosis not present

## 2014-01-03 ENCOUNTER — Ambulatory Visit
Admission: RE | Admit: 2014-01-03 | Discharge: 2014-01-03 | Disposition: A | Discharge status: Home / Self Care | Payer: Medicare Other | Source: Ambulatory Visit | Attending: Radiation Oncology | Admitting: Radiation Oncology

## 2014-01-03 DIAGNOSIS — R351 Nocturia: Secondary | ICD-10-CM | POA: Diagnosis not present

## 2014-01-03 DIAGNOSIS — Z510 Encounter for antineoplastic radiation therapy: Secondary | ICD-10-CM | POA: Diagnosis not present

## 2014-01-03 DIAGNOSIS — C61 Malignant neoplasm of prostate: Secondary | ICD-10-CM | POA: Diagnosis not present

## 2014-01-04 ENCOUNTER — Ambulatory Visit
Admission: RE | Admit: 2014-01-04 | Discharge: 2014-01-04 | Disposition: A | Discharge status: Home / Self Care | Payer: Medicare Other | Source: Ambulatory Visit | Attending: Radiation Oncology | Admitting: Radiation Oncology

## 2014-01-04 DIAGNOSIS — C61 Malignant neoplasm of prostate: Secondary | ICD-10-CM | POA: Diagnosis not present

## 2014-01-04 DIAGNOSIS — R351 Nocturia: Secondary | ICD-10-CM | POA: Diagnosis not present

## 2014-01-04 DIAGNOSIS — Z510 Encounter for antineoplastic radiation therapy: Secondary | ICD-10-CM | POA: Diagnosis not present

## 2014-01-05 ENCOUNTER — Ambulatory Visit
Admission: RE | Admit: 2014-01-05 | Discharge: 2014-01-05 | Disposition: A | Discharge status: Home / Self Care | Payer: Medicare Other | Source: Ambulatory Visit | Attending: Radiation Oncology | Admitting: Radiation Oncology

## 2014-01-05 DIAGNOSIS — Z510 Encounter for antineoplastic radiation therapy: Secondary | ICD-10-CM | POA: Diagnosis not present

## 2014-01-05 DIAGNOSIS — C61 Malignant neoplasm of prostate: Secondary | ICD-10-CM | POA: Diagnosis not present

## 2014-01-05 DIAGNOSIS — R351 Nocturia: Secondary | ICD-10-CM | POA: Diagnosis not present

## 2014-01-08 ENCOUNTER — Ambulatory Visit
Admission: RE | Admit: 2014-01-08 | Discharge: 2014-01-08 | Disposition: A | Discharge status: Home / Self Care | Payer: Medicare Other | Source: Ambulatory Visit | Attending: Radiation Oncology | Admitting: Radiation Oncology

## 2014-01-08 ENCOUNTER — Encounter: Payer: Self-pay | Admitting: Radiation Oncology

## 2014-01-08 VITALS — BP 132/80 | HR 57 | Temp 98.5°F | Resp 20 | Wt 186.7 lb

## 2014-01-08 DIAGNOSIS — C61 Malignant neoplasm of prostate: Secondary | ICD-10-CM | POA: Diagnosis not present

## 2014-01-08 DIAGNOSIS — Z510 Encounter for antineoplastic radiation therapy: Secondary | ICD-10-CM | POA: Diagnosis not present

## 2014-01-08 DIAGNOSIS — R351 Nocturia: Secondary | ICD-10-CM | POA: Diagnosis not present

## 2014-01-08 NOTE — Progress Notes (Signed)
Weekly Management Note:  Site: Prostate bed Current Dose:  3600  cGy Projected Dose: 6600  cGy  Narrative: The patient is seen today for routine under treatment assessment. CBCT/MVCT images/port films were reviewed. The chart was reviewed.   Bladder filling is satisfactory. No significant GU or GI difficulties.  Physical Examination:  Filed Vitals:   01/08/14 0826  BP: 132/80  Pulse: 57  Temp: 98.5 F (36.9 C)  Resp: 20  .  Weight: 186 lb 11.2 oz (84.687 kg). No change.  Impression: Tolerating radiation therapy well.  Plan: Continue radiation therapy as planned.

## 2014-01-08 NOTE — Progress Notes (Signed)
Pt denies pain, urinary/bowel issues, fatigue, loss of appetite. 

## 2014-01-09 ENCOUNTER — Ambulatory Visit
Admission: RE | Admit: 2014-01-09 | Discharge: 2014-01-09 | Disposition: A | Discharge status: Home / Self Care | Payer: Medicare Other | Source: Ambulatory Visit | Attending: Radiation Oncology | Admitting: Radiation Oncology

## 2014-01-09 DIAGNOSIS — Z510 Encounter for antineoplastic radiation therapy: Secondary | ICD-10-CM | POA: Diagnosis not present

## 2014-01-09 DIAGNOSIS — C61 Malignant neoplasm of prostate: Secondary | ICD-10-CM | POA: Diagnosis not present

## 2014-01-09 DIAGNOSIS — R351 Nocturia: Secondary | ICD-10-CM | POA: Diagnosis not present

## 2014-01-10 ENCOUNTER — Ambulatory Visit
Admission: RE | Admit: 2014-01-10 | Discharge: 2014-01-10 | Disposition: A | Discharge status: Home / Self Care | Payer: Medicare Other | Source: Ambulatory Visit | Attending: Radiation Oncology | Admitting: Radiation Oncology

## 2014-01-10 DIAGNOSIS — C61 Malignant neoplasm of prostate: Secondary | ICD-10-CM | POA: Diagnosis not present

## 2014-01-10 DIAGNOSIS — R351 Nocturia: Secondary | ICD-10-CM | POA: Diagnosis not present

## 2014-01-10 DIAGNOSIS — Z510 Encounter for antineoplastic radiation therapy: Secondary | ICD-10-CM | POA: Diagnosis not present

## 2014-01-11 ENCOUNTER — Ambulatory Visit
Admission: RE | Admit: 2014-01-11 | Discharge: 2014-01-11 | Disposition: A | Discharge status: Home / Self Care | Payer: Medicare Other | Source: Ambulatory Visit | Attending: Radiation Oncology | Admitting: Radiation Oncology

## 2014-01-11 DIAGNOSIS — R351 Nocturia: Secondary | ICD-10-CM | POA: Diagnosis not present

## 2014-01-11 DIAGNOSIS — Z510 Encounter for antineoplastic radiation therapy: Secondary | ICD-10-CM | POA: Diagnosis not present

## 2014-01-11 DIAGNOSIS — C61 Malignant neoplasm of prostate: Secondary | ICD-10-CM | POA: Diagnosis not present

## 2014-01-12 ENCOUNTER — Ambulatory Visit
Admission: RE | Admit: 2014-01-12 | Discharge: 2014-01-12 | Disposition: A | Discharge status: Home / Self Care | Payer: Medicare Other | Source: Ambulatory Visit | Attending: Radiation Oncology | Admitting: Radiation Oncology

## 2014-01-12 DIAGNOSIS — R351 Nocturia: Secondary | ICD-10-CM | POA: Diagnosis not present

## 2014-01-12 DIAGNOSIS — Z510 Encounter for antineoplastic radiation therapy: Secondary | ICD-10-CM | POA: Diagnosis not present

## 2014-01-12 DIAGNOSIS — C61 Malignant neoplasm of prostate: Secondary | ICD-10-CM | POA: Diagnosis not present

## 2014-01-15 ENCOUNTER — Ambulatory Visit
Admission: RE | Admit: 2014-01-15 | Discharge: 2014-01-15 | Disposition: A | Discharge status: Home / Self Care | Payer: Medicare Other | Source: Ambulatory Visit | Attending: Radiation Oncology | Admitting: Radiation Oncology

## 2014-01-15 ENCOUNTER — Encounter: Payer: Self-pay | Admitting: Radiation Oncology

## 2014-01-15 VITALS — BP 133/80 | HR 55 | Resp 16 | Wt 186.4 lb

## 2014-01-15 DIAGNOSIS — C61 Malignant neoplasm of prostate: Secondary | ICD-10-CM

## 2014-01-15 DIAGNOSIS — Z510 Encounter for antineoplastic radiation therapy: Secondary | ICD-10-CM | POA: Diagnosis not present

## 2014-01-15 DIAGNOSIS — R351 Nocturia: Secondary | ICD-10-CM | POA: Diagnosis not present

## 2014-01-15 NOTE — Progress Notes (Signed)
   Weekly Management Note:  outpatient Current Dose:  46 Gy  Projected Dose: 66 Gy   Narrative:  The patient presents for routine under treatment assessment.  CBCT/MVCT images/Port film x-rays were reviewed.  The chart was checked. No new complaints. Nocturia x 1; stable.  Occasional diarrhea (ie 1-2 x/ week)  Physical Findings:  weight is 186 lb 6.4 oz (84.55 kg). His blood pressure is 133/80 and his pulse is 55. His respiration is 16.  well appearing  Impression:  The patient is tolerating radiotherapy.  Plan:  Continue radiotherapy as planned.   ________________________________   Eppie Gibson, M.D.

## 2014-01-15 NOTE — Progress Notes (Signed)
Reports nocturia x 1. Reports mild increase in frequency during the day. Denies dysuria or hematuria. Denies diarrhea or pain associated with bowel movements. Reports manageable fatigue. Denies pain at this time. Reports a strong steady stream of urine.

## 2014-01-16 ENCOUNTER — Ambulatory Visit
Admission: RE | Admit: 2014-01-16 | Discharge: 2014-01-16 | Disposition: A | Discharge status: Home / Self Care | Payer: Medicare Other | Source: Ambulatory Visit | Attending: Radiation Oncology | Admitting: Radiation Oncology

## 2014-01-16 DIAGNOSIS — R351 Nocturia: Secondary | ICD-10-CM | POA: Diagnosis not present

## 2014-01-16 DIAGNOSIS — C61 Malignant neoplasm of prostate: Secondary | ICD-10-CM | POA: Diagnosis not present

## 2014-01-16 DIAGNOSIS — Z510 Encounter for antineoplastic radiation therapy: Secondary | ICD-10-CM | POA: Diagnosis not present

## 2014-01-17 ENCOUNTER — Ambulatory Visit
Admission: RE | Admit: 2014-01-17 | Discharge: 2014-01-17 | Disposition: A | Discharge status: Home / Self Care | Payer: Medicare Other | Source: Ambulatory Visit | Attending: Radiation Oncology | Admitting: Radiation Oncology

## 2014-01-17 DIAGNOSIS — R351 Nocturia: Secondary | ICD-10-CM | POA: Diagnosis not present

## 2014-01-17 DIAGNOSIS — Z510 Encounter for antineoplastic radiation therapy: Secondary | ICD-10-CM | POA: Diagnosis not present

## 2014-01-17 DIAGNOSIS — C61 Malignant neoplasm of prostate: Secondary | ICD-10-CM | POA: Diagnosis not present

## 2014-01-18 ENCOUNTER — Ambulatory Visit
Admission: RE | Admit: 2014-01-18 | Discharge: 2014-01-18 | Disposition: A | Discharge status: Home / Self Care | Payer: Medicare Other | Source: Ambulatory Visit | Attending: Radiation Oncology | Admitting: Radiation Oncology

## 2014-01-18 DIAGNOSIS — C61 Malignant neoplasm of prostate: Secondary | ICD-10-CM | POA: Diagnosis not present

## 2014-01-18 DIAGNOSIS — Z510 Encounter for antineoplastic radiation therapy: Secondary | ICD-10-CM | POA: Diagnosis not present

## 2014-01-18 DIAGNOSIS — R351 Nocturia: Secondary | ICD-10-CM | POA: Diagnosis not present

## 2014-01-19 ENCOUNTER — Ambulatory Visit
Admission: RE | Admit: 2014-01-19 | Discharge: 2014-01-19 | Disposition: A | Discharge status: Home / Self Care | Payer: Medicare Other | Source: Ambulatory Visit | Attending: Radiation Oncology | Admitting: Radiation Oncology

## 2014-01-19 DIAGNOSIS — C61 Malignant neoplasm of prostate: Secondary | ICD-10-CM | POA: Diagnosis not present

## 2014-01-19 DIAGNOSIS — R351 Nocturia: Secondary | ICD-10-CM | POA: Diagnosis not present

## 2014-01-19 DIAGNOSIS — Z510 Encounter for antineoplastic radiation therapy: Secondary | ICD-10-CM | POA: Diagnosis not present

## 2014-01-22 ENCOUNTER — Ambulatory Visit: Payer: Medicare Other

## 2014-01-23 ENCOUNTER — Ambulatory Visit
Admission: RE | Admit: 2014-01-23 | Discharge: 2014-01-23 | Disposition: A | Discharge status: Home / Self Care | Payer: Medicare Other | Source: Ambulatory Visit | Attending: Radiation Oncology | Admitting: Radiation Oncology

## 2014-01-23 DIAGNOSIS — Z510 Encounter for antineoplastic radiation therapy: Secondary | ICD-10-CM | POA: Diagnosis not present

## 2014-01-23 DIAGNOSIS — R351 Nocturia: Secondary | ICD-10-CM | POA: Diagnosis not present

## 2014-01-23 DIAGNOSIS — C61 Malignant neoplasm of prostate: Secondary | ICD-10-CM | POA: Diagnosis not present

## 2014-01-24 ENCOUNTER — Ambulatory Visit
Admission: RE | Admit: 2014-01-24 | Discharge: 2014-01-24 | Disposition: A | Discharge status: Home / Self Care | Payer: Medicare Other | Source: Ambulatory Visit | Attending: Radiation Oncology | Admitting: Radiation Oncology

## 2014-01-24 VITALS — BP 121/80 | HR 73 | Temp 98.6°F | Resp 20 | Wt 186.0 lb

## 2014-01-24 DIAGNOSIS — R351 Nocturia: Secondary | ICD-10-CM | POA: Diagnosis not present

## 2014-01-24 DIAGNOSIS — C61 Malignant neoplasm of prostate: Secondary | ICD-10-CM

## 2014-01-24 DIAGNOSIS — Z510 Encounter for antineoplastic radiation therapy: Secondary | ICD-10-CM | POA: Diagnosis not present

## 2014-01-24 NOTE — Progress Notes (Signed)
Weekly Management Note:  Site: Prostate bed Current Dose:  5800  cGy Projected Dose: 6600  cGy  Narrative: The patient is seen today for routine under treatment assessment. CBCT/MVCT images/port films were reviewed. The chart was reviewed.   Bladder filling has been satisfactory. No GU or GI difficulties.  Physical Examination:  Filed Vitals:   01/24/14 0823  BP: 121/80  Pulse: 73  Temp: 98.6 F (37 C)  Resp: 20  .  Weight: 186 lb (84.369 kg). No change.  Impression: Tolerating radiation therapy well.  Plan: Continue radiation therapy as planned.

## 2014-01-24 NOTE — Progress Notes (Signed)
Pt denies urinary, bowel issues, pain, loss of appetite. Pt is fatigued.

## 2014-01-25 ENCOUNTER — Ambulatory Visit
Admission: RE | Admit: 2014-01-25 | Discharge: 2014-01-25 | Disposition: A | Discharge status: Home / Self Care | Payer: Medicare Other | Source: Ambulatory Visit | Attending: Radiation Oncology | Admitting: Radiation Oncology

## 2014-01-25 DIAGNOSIS — Z510 Encounter for antineoplastic radiation therapy: Secondary | ICD-10-CM | POA: Diagnosis not present

## 2014-01-25 DIAGNOSIS — R351 Nocturia: Secondary | ICD-10-CM | POA: Diagnosis not present

## 2014-01-25 DIAGNOSIS — C61 Malignant neoplasm of prostate: Secondary | ICD-10-CM | POA: Diagnosis not present

## 2014-01-26 ENCOUNTER — Ambulatory Visit
Admission: RE | Admit: 2014-01-26 | Discharge: 2014-01-26 | Disposition: A | Discharge status: Home / Self Care | Payer: Medicare Other | Source: Ambulatory Visit | Attending: Radiation Oncology | Admitting: Radiation Oncology

## 2014-01-26 DIAGNOSIS — Z510 Encounter for antineoplastic radiation therapy: Secondary | ICD-10-CM | POA: Diagnosis not present

## 2014-01-26 DIAGNOSIS — R351 Nocturia: Secondary | ICD-10-CM | POA: Diagnosis not present

## 2014-01-26 DIAGNOSIS — C61 Malignant neoplasm of prostate: Secondary | ICD-10-CM | POA: Diagnosis not present

## 2014-01-29 ENCOUNTER — Encounter: Payer: Self-pay | Admitting: Radiation Oncology

## 2014-01-29 ENCOUNTER — Ambulatory Visit: Payer: Medicare Other

## 2014-01-29 ENCOUNTER — Ambulatory Visit
Admission: RE | Admit: 2014-01-29 | Discharge: 2014-01-29 | Disposition: A | Discharge status: Home / Self Care | Payer: Medicare Other | Source: Ambulatory Visit | Attending: Radiation Oncology | Admitting: Radiation Oncology

## 2014-01-29 VITALS — BP 122/81 | HR 66 | Temp 98.6°F | Resp 20 | Wt 187.3 lb

## 2014-01-29 DIAGNOSIS — Z510 Encounter for antineoplastic radiation therapy: Secondary | ICD-10-CM | POA: Diagnosis not present

## 2014-01-29 DIAGNOSIS — C61 Malignant neoplasm of prostate: Secondary | ICD-10-CM | POA: Diagnosis not present

## 2014-01-29 DIAGNOSIS — R351 Nocturia: Secondary | ICD-10-CM | POA: Diagnosis not present

## 2014-01-29 NOTE — Progress Notes (Signed)
Weekly Management Note:  Site: Prostate bed Current Dose:  6400  cGy Projected Dose: 6600  cGy  Narrative: The patient is seen today for routine under treatment assessment. CBCT/MVCT images/port films were reviewed. The chart was reviewed.   Bladder filling is less than optimal today. No significant GU or GI difficulties. He'll finish his radiation therapy tomorrow.  Physical Examination:  Filed Vitals:   01/29/14 0830  BP: 122/81  Pulse: 66  Temp: 98.6 F (37 C)  Resp: 20  .  Weight: 187 lb 4.8 oz (84.959 kg). No change.  Impression: Tolerating radiation therapy well.  Plan: Continue radiation therapy as planned.

## 2014-01-29 NOTE — Progress Notes (Signed)
Pt denies pain, bowel issues, fatigue, loss of appetite. He has some increased urinary frequency. Pt will complete tomorrow; gave him FU card.

## 2014-01-30 ENCOUNTER — Encounter: Payer: Self-pay | Admitting: Radiation Oncology

## 2014-01-30 ENCOUNTER — Ambulatory Visit
Admission: RE | Admit: 2014-01-30 | Discharge: 2014-01-30 | Disposition: A | Discharge status: Home / Self Care | Payer: Medicare Other | Source: Ambulatory Visit | Attending: Radiation Oncology | Admitting: Radiation Oncology

## 2014-01-30 DIAGNOSIS — Z510 Encounter for antineoplastic radiation therapy: Secondary | ICD-10-CM | POA: Diagnosis not present

## 2014-01-30 DIAGNOSIS — R351 Nocturia: Secondary | ICD-10-CM | POA: Diagnosis not present

## 2014-01-30 DIAGNOSIS — C61 Malignant neoplasm of prostate: Secondary | ICD-10-CM | POA: Diagnosis not present

## 2014-01-30 NOTE — Progress Notes (Signed)
[facility] End of Treatment Note  Name:Donald Villegas  Date: 01/30/2014 YCX:[id number] DOB:[birth year]   Status:outpatient    CC: Joycelyn Man, MD  Dr. Derinda Sis,  [facility], Dr. Ardath Sax, [facility]  REFERRING PHYSICIAN: Dr. Derinda Sis    DIAGNOSIS: Carcinoma the prostate, PSA/biochemical recurrence   INDICATION FOR TREATMENT: Curative   TREATMENT DATES: 12/14/2013 through 01/30/2014                          SITE/DOSE:   Prostate bed 6600 cGy in 33 sessions                         BEAMS/ENERGY:   6 MV photons, helical IMRT Tomotherapy                NARRATIVE:   [name] tolerated his treatment well with no significant GU or GI toxicity during his course of therapy.                         PLAN: Routine followup in one month. Patient instructed to call if questions or worsening complaints in interim.

## 2014-02-08 ENCOUNTER — Ambulatory Visit (INDEPENDENT_AMBULATORY_CARE_PROVIDER_SITE_OTHER): Payer: Medicare Other | Admitting: General Practice

## 2014-02-08 DIAGNOSIS — Z5181 Encounter for therapeutic drug level monitoring: Secondary | ICD-10-CM

## 2014-02-08 DIAGNOSIS — I2699 Other pulmonary embolism without acute cor pulmonale: Secondary | ICD-10-CM | POA: Diagnosis not present

## 2014-02-08 LAB — POCT INR: INR: 2.2

## 2014-02-08 NOTE — Progress Notes (Signed)
Pre visit review using our clinic review tool, if applicable. No additional management support is needed unless otherwise documented below in the visit note. 

## 2014-02-21 ENCOUNTER — Encounter: Payer: Self-pay | Admitting: *Deleted

## 2014-02-28 ENCOUNTER — Encounter: Payer: Self-pay | Admitting: Radiation Oncology

## 2014-02-28 ENCOUNTER — Ambulatory Visit
Admission: RE | Admit: 2014-02-28 | Discharge: 2014-02-28 | Disposition: A | Discharge status: Home / Self Care | Payer: Medicare Other | Source: Ambulatory Visit | Attending: Radiation Oncology | Admitting: Radiation Oncology

## 2014-02-28 VITALS — BP 132/73 | HR 61 | Temp 98.2°F | Resp 20 | Wt 185.0 lb

## 2014-02-28 DIAGNOSIS — C61 Malignant neoplasm of prostate: Secondary | ICD-10-CM

## 2014-02-28 NOTE — Progress Notes (Signed)
Pt states he was nauseated most days through radiation treatment but no longer. He states "some days he's fatigued, some days he is not". Pt states he has developed pain in both hip joints, not engaging in new activities. This pain is not constant, and he does not take OTC medication for this. Pt denies urinary/ bowel issues, loss of appetite. Pt does not have FU w/urologist at this time.

## 2014-02-28 NOTE — Progress Notes (Signed)
CC: Dr. Christie Nottingham, Dr. Derinda Sis [facility] [place]), Dr. Ardath Sax ([place])  Followup note:  Mr. Duval returns today approximately 1 month following completion of salvage IMRT in the management of his PSA recurrent carcinoma the prostate. He is without GU or GI difficulty. His urinary and bowel habits have returned to normal. He tells me he will see Dr. Lyndel Pleasure for a followup visit in mid July. He will have a PSA determination at that time.  Physical examination: Alert and oriented. Filed Vitals:   02/28/14 0900  BP: 132/73  Pulse: 61  Temp: 98.2 F (36.8 C)  Resp: 20   He's not examined today.  Impression: Satisfactory progress. We will to see a falling PSA when he visits [facility] this July. We discussed the implications of a rising PSA, which would suggest disease spread beyond the prostate bed. He inquired about  future therapies should he have a rising PSA. We discussed androgen deprivation therapy, and when to begin androgen deprivation therapy  based on the absolute PSA value, and PSA doubling time. I told that this was typically managed by a urologist.  Plan: Followup through [facility].

## 2014-03-22 ENCOUNTER — Ambulatory Visit (INDEPENDENT_AMBULATORY_CARE_PROVIDER_SITE_OTHER): Payer: Medicare Other | Admitting: Family

## 2014-03-22 DIAGNOSIS — Z5181 Encounter for therapeutic drug level monitoring: Secondary | ICD-10-CM | POA: Diagnosis not present

## 2014-03-22 DIAGNOSIS — I2699 Other pulmonary embolism without acute cor pulmonale: Secondary | ICD-10-CM

## 2014-03-22 LAB — POCT INR: INR: 2.9

## 2014-03-22 NOTE — Patient Instructions (Signed)
Continue to take 5 mg all days except take 2.5 mg on Sunday and Wednesday.  Re-check in 6 weeks  Anticoagulation Dose Instructions as of 03/22/2014     Dorene Grebe Tue Wed Thu Fri Sat   New Dose 2.5 mg 5 mg 5 mg 2.5 mg 5 mg 5 mg 5 mg    Description       Continue to take 5 mg all days except take 2.5 mg on Sunday and Wednesday.  Re-check in 6 weeks.

## 2014-04-30 DIAGNOSIS — C61 Malignant neoplasm of prostate: Secondary | ICD-10-CM | POA: Diagnosis not present

## 2014-05-03 ENCOUNTER — Ambulatory Visit: Payer: Medicare Other

## 2014-05-17 ENCOUNTER — Ambulatory Visit (INDEPENDENT_AMBULATORY_CARE_PROVIDER_SITE_OTHER): Payer: Medicare Other | Admitting: General Practice

## 2014-05-17 DIAGNOSIS — Z5181 Encounter for therapeutic drug level monitoring: Secondary | ICD-10-CM | POA: Diagnosis not present

## 2014-05-17 DIAGNOSIS — I2699 Other pulmonary embolism without acute cor pulmonale: Secondary | ICD-10-CM | POA: Diagnosis not present

## 2014-05-17 LAB — POCT INR: INR: 2.3

## 2014-05-17 NOTE — Progress Notes (Signed)
Pre visit review using our clinic review tool, if applicable. No additional management support is needed unless otherwise documented below in the visit note. 

## 2014-06-01 DIAGNOSIS — C61 Malignant neoplasm of prostate: Secondary | ICD-10-CM | POA: Diagnosis not present

## 2014-06-28 ENCOUNTER — Ambulatory Visit: Payer: Medicare Other | Admitting: Family

## 2014-06-28 ENCOUNTER — Ambulatory Visit: Payer: Medicare Other

## 2014-07-02 ENCOUNTER — Other Ambulatory Visit: Payer: Self-pay | Admitting: Dermatology

## 2014-07-02 DIAGNOSIS — Z85820 Personal history of malignant melanoma of skin: Secondary | ICD-10-CM | POA: Diagnosis not present

## 2014-07-02 DIAGNOSIS — L821 Other seborrheic keratosis: Secondary | ICD-10-CM | POA: Diagnosis not present

## 2014-07-02 DIAGNOSIS — L988 Other specified disorders of the skin and subcutaneous tissue: Secondary | ICD-10-CM | POA: Diagnosis not present

## 2014-07-02 DIAGNOSIS — L738 Other specified follicular disorders: Secondary | ICD-10-CM | POA: Diagnosis not present

## 2014-07-02 DIAGNOSIS — Z85828 Personal history of other malignant neoplasm of skin: Secondary | ICD-10-CM | POA: Diagnosis not present

## 2014-07-02 DIAGNOSIS — C44319 Basal cell carcinoma of skin of other parts of face: Secondary | ICD-10-CM | POA: Diagnosis not present

## 2014-07-02 DIAGNOSIS — D485 Neoplasm of uncertain behavior of skin: Secondary | ICD-10-CM | POA: Diagnosis not present

## 2014-07-02 DIAGNOSIS — L820 Inflamed seborrheic keratosis: Secondary | ICD-10-CM | POA: Diagnosis not present

## 2014-07-02 DIAGNOSIS — C4441 Basal cell carcinoma of skin of scalp and neck: Secondary | ICD-10-CM | POA: Diagnosis not present

## 2014-07-02 DIAGNOSIS — L819 Disorder of pigmentation, unspecified: Secondary | ICD-10-CM | POA: Diagnosis not present

## 2014-07-05 ENCOUNTER — Ambulatory Visit (INDEPENDENT_AMBULATORY_CARE_PROVIDER_SITE_OTHER): Payer: Medicare Other | Admitting: Family

## 2014-07-05 DIAGNOSIS — Z5181 Encounter for therapeutic drug level monitoring: Secondary | ICD-10-CM | POA: Diagnosis not present

## 2014-07-05 DIAGNOSIS — I2699 Other pulmonary embolism without acute cor pulmonale: Secondary | ICD-10-CM | POA: Diagnosis not present

## 2014-07-05 LAB — POCT INR: INR: 2.7

## 2014-07-05 NOTE — Patient Instructions (Signed)
Continue to take 5 mg all days except take 2.5 mg on Sunday and Wednesday.  Re-check in 6 weeks.  Anticoagulation Dose Instructions as of 07/05/2014     Dorene Grebe Tue Wed Thu Fri Sat   New Dose 2.5 mg 5 mg 5 mg 2.5 mg 5 mg 5 mg 5 mg    Description       Continue to take 5 mg all days except take 2.5 mg on Sunday and Wednesday.  Re-check in 6 weeks.

## 2014-07-16 DIAGNOSIS — Z85828 Personal history of other malignant neoplasm of skin: Secondary | ICD-10-CM | POA: Diagnosis not present

## 2014-07-16 DIAGNOSIS — C44319 Basal cell carcinoma of skin of other parts of face: Secondary | ICD-10-CM | POA: Diagnosis not present

## 2014-07-16 DIAGNOSIS — Z85820 Personal history of malignant melanoma of skin: Secondary | ICD-10-CM | POA: Diagnosis not present

## 2014-07-22 ENCOUNTER — Other Ambulatory Visit: Payer: Self-pay | Admitting: Family Medicine

## 2014-07-30 DIAGNOSIS — C61 Malignant neoplasm of prostate: Secondary | ICD-10-CM | POA: Diagnosis not present

## 2014-08-08 DIAGNOSIS — M545 Low back pain: Secondary | ICD-10-CM | POA: Diagnosis not present

## 2014-08-08 DIAGNOSIS — M25551 Pain in right hip: Secondary | ICD-10-CM | POA: Diagnosis not present

## 2014-08-16 ENCOUNTER — Ambulatory Visit (INDEPENDENT_AMBULATORY_CARE_PROVIDER_SITE_OTHER): Payer: Medicare Other | Admitting: Family

## 2014-08-16 ENCOUNTER — Ambulatory Visit: Payer: Medicare Other

## 2014-08-16 DIAGNOSIS — Z5181 Encounter for therapeutic drug level monitoring: Secondary | ICD-10-CM | POA: Diagnosis not present

## 2014-08-16 DIAGNOSIS — I2699 Other pulmonary embolism without acute cor pulmonale: Secondary | ICD-10-CM

## 2014-08-16 LAB — POCT INR: INR: 2.5

## 2014-08-16 NOTE — Patient Instructions (Signed)
Continue to take 5 mg all days except take 2.5 mg on Sunday and Wednesday.  Re-check in 6 weeks.  Anticoagulation Dose Instructions as of 08/16/2014     Dorene Grebe Tue Wed Thu Fri Sat   New Dose 2.5 mg 5 mg 5 mg 2.5 mg 5 mg 5 mg 5 mg    Description       Continue to take 5 mg all days except take 2.5 mg on Sunday and Wednesday.  Re-check in 6 weeks.

## 2014-09-28 DIAGNOSIS — Z23 Encounter for immunization: Secondary | ICD-10-CM | POA: Diagnosis not present

## 2014-10-08 ENCOUNTER — Ambulatory Visit (INDEPENDENT_AMBULATORY_CARE_PROVIDER_SITE_OTHER): Payer: Medicare Other | Admitting: Family

## 2014-10-08 DIAGNOSIS — Z5181 Encounter for therapeutic drug level monitoring: Secondary | ICD-10-CM

## 2014-10-08 DIAGNOSIS — I2699 Other pulmonary embolism without acute cor pulmonale: Secondary | ICD-10-CM | POA: Diagnosis not present

## 2014-10-08 LAB — POCT INR: INR: 2.6

## 2014-10-08 NOTE — Patient Instructions (Signed)
Continue to take 5 mg all days except take 2.5 mg on Sunday and Wednesday.  Re-check in 6 weeks.   Anticoagulation Dose Instructions as of 10/08/2014      Dorene Grebe Tue Wed Thu Fri Sat   New Dose 2.5 mg 5 mg 5 mg 2.5 mg 5 mg 5 mg 5 mg    Description        Continue to take 5 mg all days except take 2.5 mg on Sunday and Wednesday.  Re-check in 6 weeks.

## 2014-10-25 ENCOUNTER — Encounter: Payer: Self-pay | Admitting: Family Medicine

## 2014-10-25 ENCOUNTER — Ambulatory Visit (INDEPENDENT_AMBULATORY_CARE_PROVIDER_SITE_OTHER): Payer: Medicare Other | Admitting: Family Medicine

## 2014-10-25 VITALS — BP 110/80 | Temp 97.9°F | Ht 69.75 in | Wt 187.0 lb

## 2014-10-25 DIAGNOSIS — I2699 Other pulmonary embolism without acute cor pulmonale: Secondary | ICD-10-CM

## 2014-10-25 DIAGNOSIS — Z23 Encounter for immunization: Secondary | ICD-10-CM | POA: Diagnosis not present

## 2014-10-25 DIAGNOSIS — Z862 Personal history of diseases of the blood and blood-forming organs and certain disorders involving the immune mechanism: Secondary | ICD-10-CM | POA: Diagnosis not present

## 2014-10-25 DIAGNOSIS — Z0000 Encounter for general adult medical examination without abnormal findings: Secondary | ICD-10-CM

## 2014-10-25 DIAGNOSIS — C61 Malignant neoplasm of prostate: Secondary | ICD-10-CM

## 2014-10-25 LAB — POCT URINALYSIS DIPSTICK
BILIRUBIN UA: NEGATIVE
GLUCOSE UA: NEGATIVE
KETONES UA: NEGATIVE
Leukocytes, UA: NEGATIVE
Nitrite, UA: NEGATIVE
Protein, UA: NEGATIVE
RBC UA: NEGATIVE
SPEC GRAV UA: 1.025
UROBILINOGEN UA: 0.2
pH, UA: 7

## 2014-10-25 LAB — BASIC METABOLIC PANEL
BUN: 25 mg/dL — ABNORMAL HIGH (ref 6–23)
CALCIUM: 9.7 mg/dL (ref 8.4–10.5)
CO2: 29 mEq/L (ref 19–32)
Chloride: 104 mEq/L (ref 96–112)
Creatinine, Ser: 0.9 mg/dL (ref 0.4–1.5)
GFR: 85.2 mL/min (ref >60.00)
Glucose, Bld: 94 mg/dL (ref 70–99)
POTASSIUM: 4.3 meq/L (ref 3.5–5.1)
Sodium: 138 mEq/L (ref 135–145)

## 2014-10-25 LAB — LIPID PANEL
CHOLESTEROL: 193 mg/dL (ref 0–200)
HDL: 38.6 mg/dL — ABNORMAL LOW (ref >39.00)
LDL CALC: 135 mg/dL — AB (ref 0–99)
NonHDL: 154.4
TRIGLYCERIDES: 99 mg/dL (ref 0.0–149.0)
Total CHOL/HDL Ratio: 5
VLDL: 19.8 mg/dL (ref 0.0–40.0)

## 2014-10-25 LAB — CBC WITH DIFFERENTIAL/PLATELET
BASOS PCT: 0.2 % (ref 0.0–3.0)
Basophils Absolute: 0 10*3/uL (ref 0.0–0.1)
EOS ABS: 0 10*3/uL (ref 0.0–0.7)
Eosinophils Relative: 0.1 % (ref 0.0–5.0)
HEMATOCRIT: 47 % (ref 39.0–52.0)
HEMOGLOBIN: 15.5 g/dL (ref 13.0–17.0)
LYMPHS ABS: 0.8 10*3/uL (ref 0.7–4.0)
LYMPHS PCT: 15.7 % (ref 12.0–46.0)
MCHC: 33 g/dL (ref 30.0–36.0)
MCV: 98.4 fl (ref 78.0–100.0)
MONO ABS: 0.3 10*3/uL (ref 0.1–1.0)
Monocytes Relative: 6.1 % (ref 3.0–12.0)
NEUTROS ABS: 3.8 10*3/uL (ref 1.4–7.7)
Neutrophils Relative %: 77.9 % — ABNORMAL HIGH (ref 43.0–77.0)
Platelets: 216 10*3/uL (ref 150.0–400.0)
RBC: 4.77 Mil/uL (ref 4.22–5.81)
RDW: 13.6 % (ref 11.5–15.5)
WBC: 4.8 10*3/uL (ref 4.0–10.5)

## 2014-10-25 LAB — HEPATIC FUNCTION PANEL
ALBUMIN: 4 g/dL (ref 3.5–5.2)
ALK PHOS: 56 U/L (ref 39–117)
ALT: 20 U/L (ref 0–53)
AST: 22 U/L (ref 0–37)
Bilirubin, Direct: 0.2 mg/dL (ref 0.0–0.3)
Total Bilirubin: 0.6 mg/dL (ref 0.2–1.2)
Total Protein: 6.9 g/dL (ref 6.0–8.3)

## 2014-10-25 LAB — TSH: TSH: 2.08 u[IU]/mL (ref 0.35–4.50)

## 2014-10-25 LAB — PSA: PSA: 0.11 ng/mL (ref 0.10–4.00)

## 2014-10-25 MED ORDER — WARFARIN SODIUM 5 MG PO TABS: ORAL_TABLET | ORAL | Status: DC

## 2014-10-25 MED ORDER — SILDENAFIL CITRATE 100 MG PO TABS
50.0000 mg | ORAL_TABLET | Freq: Every day | ORAL | Status: DC | PRN
Start: 2014-10-25 — End: 2019-01-25

## 2014-10-25 MED ORDER — TADALAFIL 20 MG PO TABS
10.0000 mg | ORAL_TABLET | ORAL | Status: DC | PRN
Start: 2014-10-25 — End: 2019-01-25

## 2014-10-25 NOTE — Progress Notes (Signed)
Subjective:    Patient ID: Donald Villegas, male    DOB: Dec 24, 1945, 69 y.o.   MRN: [id number]  HPI AL is a 69 year old married male nonsmoker who comes in today for general physical exam because of a history of prostate cancer and a coagulopathy requiring chronic Coumadin therapy  For years ago he had his prostate removed at [facility]. He's done well since that time. He had some increase in PSA last year and had pelvic radiation by Dr. Adela Lank. He's going back to [facility] for follow-up soon and would like another PSA drawn here.  He has a history of a coagulopathy and is on chronic Coumadin therapy. Advised by hematology to take it lifelong. 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  He gets routine eye care, dental care, colonoscopy 10 years ago by Dr. Craig Staggers  Vaccinations updated by Apolonio Schneiders. Also advised to call his insurance company about the shingles vaccine  He also takes Pepcid 20 mg daily for chronic mild reflux  He has numerous skin problems and is followed by dermatology. He takes prednisone for couple days when it flares up this is done at the direction of his dermatologist.  Cognitive function normal he walks daily home health safety reviewed no issues identified, no guns in the house, he does have a healthcare power of attorney and living well. 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   HENT: Negative.   Eyes: Negative.   Respiratory: Negative.   Cardiovascular: Negative.   Gastrointestinal: Negative.   Endocrine: Negative.   Genitourinary: Negative.   Musculoskeletal: Negative.   Skin: Negative.   Allergic/Immunologic: Negative.   Neurological: Negative.   Hematological: Negative.   Psychiatric/Behavioral: Negative.        Objective:   Physical Exam  Constitutional: He is oriented to person, place, and time. He appears well-developed and well-nourished.  HENT:  Head: Normocephalic and atraumatic.    Right Ear: External ear normal.  Left Ear: External ear normal.  Nose: Nose normal.  Mouth/Throat: Oropharynx is clear and moist.  Eyes: Conjunctivae and EOM are normal. Pupils are equal, round, and reactive to light.  Neck: Normal range of motion. Neck supple. No JVD present. No tracheal deviation present. No thyromegaly present.  Cardiovascular: Normal rate, regular rhythm, normal heart sounds and intact distal pulses.  Exam reveals no gallop and no friction rub.   No murmur heard. No carotid nor aortic bruits peripheral pulses 2+ and symmetrical  Pulmonary/Chest: Effort normal and breath sounds normal. No stridor. No respiratory distress. He has no wheezes. He has no rales. He exhibits no tenderness.  Abdominal: Soft. Bowel sounds are normal. He exhibits no distension and no mass. There is no tenderness. There is no rebound and no guarding.  Genitourinary:  Genitourinary exam done by urologist therefore not repeated  Musculoskeletal: Normal range of motion. He exhibits no edema or tenderness.  Lymphadenopathy:    He has no cervical adenopathy.  Neurological: He is alert and oriented to person, place, and time. He has normal reflexes. No cranial nerve deficit. He exhibits normal muscle tone.  Skin: Skin is warm and dry. No rash noted. No erythema. No pallor.  He has freckles mole seborrheic keratosis scars from previous biopsies. His most recent one was a left forehead where he had Mohs surgery.  Psychiatric: He has a normal mood and affect. His behavior is normal. Judgment and thought content normal.  Nursing note and vitals  reviewed.         Assessment & Plan:  Healthy male  History of coagulopathy....... continue daily Coumadin  History of prostate cancer..... Surgery 4 years ago......Marland Kitchen radiation a year ago..... Follow-up at [facility] skin problems followed by dermatology

## 2014-10-25 NOTE — Patient Instructions (Signed)
Viagra and Cialis can be purchased in [place] cheaper......... [email or web address]  Continue the Coumadin as directed and follow-up pro time as outlined  Return in one year sooner if any problems

## 2014-11-19 ENCOUNTER — Ambulatory Visit: Payer: Medicare Other | Admitting: Family

## 2014-11-22 ENCOUNTER — Ambulatory Visit (INDEPENDENT_AMBULATORY_CARE_PROVIDER_SITE_OTHER): Payer: Medicare Other | Admitting: General Practice

## 2014-11-22 DIAGNOSIS — I2699 Other pulmonary embolism without acute cor pulmonale: Secondary | ICD-10-CM | POA: Diagnosis not present

## 2014-11-22 DIAGNOSIS — Z5181 Encounter for therapeutic drug level monitoring: Secondary | ICD-10-CM | POA: Diagnosis not present

## 2014-11-22 LAB — POCT INR: INR: 2

## 2014-11-22 NOTE — Progress Notes (Signed)
Pre visit review using our clinic review tool, if applicable. No additional management support is needed unless otherwise documented below in the visit note. 

## 2014-12-14 DIAGNOSIS — Z79899 Other long term (current) drug therapy: Secondary | ICD-10-CM | POA: Diagnosis not present

## 2014-12-14 DIAGNOSIS — C61 Malignant neoplasm of prostate: Secondary | ICD-10-CM | POA: Diagnosis not present

## 2014-12-24 ENCOUNTER — Encounter: Payer: Self-pay | Admitting: Family Medicine

## 2014-12-24 ENCOUNTER — Ambulatory Visit (INDEPENDENT_AMBULATORY_CARE_PROVIDER_SITE_OTHER): Payer: Medicare Other | Admitting: Family Medicine

## 2014-12-24 VITALS — BP 120/80 | HR 78 | Temp 97.6°F

## 2014-12-24 DIAGNOSIS — J45909 Unspecified asthma, uncomplicated: Secondary | ICD-10-CM

## 2014-12-24 DIAGNOSIS — J069 Acute upper respiratory infection, unspecified: Secondary | ICD-10-CM

## 2014-12-24 MED ORDER — PREDNISONE 20 MG PO TABS: ORAL_TABLET | ORAL | Status: DC

## 2014-12-24 MED ORDER — HYDROCODONE-HOMATROPINE 5-1.5 MG/5ML PO SYRP: 5.0000 mL | ORAL_SOLUTION | Freq: Three times a day (TID) | ORAL | Status: DC | PRN

## 2014-12-24 NOTE — Progress Notes (Signed)
   Subjective:    Patient ID: Donald Villegas, male    DOB: 02/25/1946, 69 y.o.   MRN: [id number]  HPI  Donald Villegas is a 69 year old married male nonsmoker who comes in today because of a cold and cough for one week  Both his wife and son of had a cold. He developed cold symptoms about a week ago with head congestion runny nose and cough. No fever earache nausea vomiting or diarrhea. No sputum production. He feels tightness in the chest. No history of asthma or allergies   Review of Systems Review of systems negative    Objective:   Physical Exam  Well-developed well-nourished male no acute distress vital signs stable he is afebrile HEENT were negative neck was supple no adenopathy lungs are clear except for mild symmetrical wheezing on forced expiration      Assessment & Plan:  Viral syndrome with secondary asthma...Marland KitchenMarland KitchenMarland Kitchen treat symptomatically with lots of liquids prednisone and cough syrup

## 2014-12-24 NOTE — Progress Notes (Signed)
Pre visit review using our clinic review tool, if applicable. No additional management support is needed unless otherwise documented below in the visit note. 

## 2014-12-24 NOTE — Patient Instructions (Signed)
Prednisone 20 mg........ 2 tablets daily 3 days or until you feel significantly better then taper as outlined  Hydromet...Marland KitchenMarland KitchenMarland Kitchen 1/2-1 teaspoon up to 3 times daily when necessary for cough  Drink lots of water  Return when necessary

## 2014-12-29 ENCOUNTER — Ambulatory Visit (INDEPENDENT_AMBULATORY_CARE_PROVIDER_SITE_OTHER): Payer: Medicare Other | Admitting: Family Medicine

## 2014-12-29 ENCOUNTER — Encounter: Payer: Self-pay | Admitting: Family Medicine

## 2014-12-29 VITALS — BP 130/84 | HR 87 | Temp 99.7°F | Resp 16 | Ht 69.75 in | Wt 187.1 lb

## 2014-12-29 DIAGNOSIS — R05 Cough: Secondary | ICD-10-CM | POA: Diagnosis not present

## 2014-12-29 DIAGNOSIS — R059 Cough, unspecified: Secondary | ICD-10-CM | POA: Insufficient documentation

## 2014-12-29 MED ORDER — DOXYCYCLINE HYCLATE 100 MG PO TABS: 100.0000 mg | ORAL_TABLET | Freq: Two times a day (BID) | ORAL | Status: DC

## 2014-12-29 NOTE — Patient Instructions (Signed)
Doxy 1 tab twice a day.  Coumadin 2.5mg  a day for now.  Keep the INR check later this week.  If profoundly SOB, to ER.  Take care.  Drink plenty of fluids in the meantime.

## 2014-12-29 NOTE — Progress Notes (Signed)
Pre visit review using our clinic review tool, if applicable. No additional management support is needed unless otherwise documented below in the visit note.  Sick for 1 week initially, then started on hycodan and prednisone this week per PCP.  He was coughing up more sputum in the meantime, worse at night.  No known fevers at home.  Some sweats at home.  Chest/upper abd was sore yesterday, likely from coughing, better today.  No vomiting, no diarrhea, no rash.  occ scant wheeze per patient.  Sleep disrupted.  Sputum is green and thick.    Recheck pulse ox 92%.   Meds, vitals, and allergies reviewed.   ROS: See HPI.  Otherwise, noncontributory.  GEN: nad, alert and oriented HEENT: mucous membranes moist, tm w/o erythema, nasal exam w/o erythema, scant clear discharge noted,  OP wnl NECK: supple w/o LA CV: rrr.   PULM: ctab, no inc wob, no wheeze, no focal dec in BS EXT: no edema

## 2014-12-29 NOTE — Assessment & Plan Note (Signed)
~  2 weeks, now with green sputum in spite of pred and hycodan.  D/w pt.  Would treat for presumptive bacterial source.  No inc in wob, still okay for outpatient f/u.  He agrees.  We don't need CXR at this point.  D/w pt.  Will cut coumadin to 2.5mg  a day while on abx, has fu with INR clinic pending.   Start doxy in meantime.

## 2014-12-31 ENCOUNTER — Telehealth: Payer: Self-pay | Admitting: Family Medicine

## 2014-12-31 ENCOUNTER — Other Ambulatory Visit: Payer: Self-pay | Admitting: Dermatology

## 2014-12-31 ENCOUNTER — Ambulatory Visit (INDEPENDENT_AMBULATORY_CARE_PROVIDER_SITE_OTHER)
Admission: RE | Admit: 2014-12-31 | Discharge: 2014-12-31 | Disposition: A | Discharge status: Home / Self Care | Payer: Medicare Other | Source: Ambulatory Visit | Attending: Family Medicine | Admitting: Family Medicine

## 2014-12-31 DIAGNOSIS — R05 Cough: Secondary | ICD-10-CM

## 2014-12-31 DIAGNOSIS — R0602 Shortness of breath: Secondary | ICD-10-CM | POA: Diagnosis not present

## 2014-12-31 DIAGNOSIS — D485 Neoplasm of uncertain behavior of skin: Secondary | ICD-10-CM | POA: Diagnosis not present

## 2014-12-31 DIAGNOSIS — L739 Follicular disorder, unspecified: Secondary | ICD-10-CM | POA: Diagnosis not present

## 2014-12-31 DIAGNOSIS — L821 Other seborrheic keratosis: Secondary | ICD-10-CM | POA: Diagnosis not present

## 2014-12-31 DIAGNOSIS — Z85828 Personal history of other malignant neoplasm of skin: Secondary | ICD-10-CM | POA: Diagnosis not present

## 2014-12-31 DIAGNOSIS — L570 Actinic keratosis: Secondary | ICD-10-CM | POA: Diagnosis not present

## 2014-12-31 DIAGNOSIS — Z85820 Personal history of malignant melanoma of skin: Secondary | ICD-10-CM | POA: Diagnosis not present

## 2014-12-31 DIAGNOSIS — R059 Cough, unspecified: Secondary | ICD-10-CM

## 2014-12-31 NOTE — Telephone Encounter (Signed)
Pt would like a call back. He said he not getting any better and went to the [facility]. He said he has had pneumonia twice and is requesting a chest XRAY

## 2014-12-31 NOTE — Telephone Encounter (Signed)
Okay per Dr Sherren Mocha and xray ordered

## 2015-01-01 ENCOUNTER — Other Ambulatory Visit: Payer: Self-pay | Admitting: *Deleted

## 2015-01-01 MED ORDER — HYDROCODONE-HOMATROPINE 5-1.5 MG/5ML PO SYRP: 5.0000 mL | ORAL_SOLUTION | Freq: Three times a day (TID) | ORAL | Status: DC | PRN

## 2015-01-01 NOTE — Telephone Encounter (Signed)
Patient request refill of hydromet Okay per Dr Sherren Mocha

## 2015-01-03 ENCOUNTER — Ambulatory Visit (INDEPENDENT_AMBULATORY_CARE_PROVIDER_SITE_OTHER): Payer: Medicare Other | Admitting: General Practice

## 2015-01-03 DIAGNOSIS — Z5181 Encounter for therapeutic drug level monitoring: Secondary | ICD-10-CM | POA: Diagnosis not present

## 2015-01-03 DIAGNOSIS — I2699 Other pulmonary embolism without acute cor pulmonale: Secondary | ICD-10-CM

## 2015-01-03 LAB — POCT INR: INR: 3.3

## 2015-01-03 NOTE — Progress Notes (Signed)
Pre visit review using our clinic review tool, if applicable. No additional management support is needed unless otherwise documented below in the visit note. 

## 2015-01-20 ENCOUNTER — Emergency Department (HOSPITAL_COMMUNITY)
Admission: EM | Admit: 2015-01-20 | Discharge: 2015-01-20 | Disposition: A | Discharge status: Home / Self Care | Payer: Medicare Other | Attending: Emergency Medicine | Admitting: Emergency Medicine

## 2015-01-20 ENCOUNTER — Encounter (HOSPITAL_COMMUNITY): Payer: Self-pay | Admitting: *Deleted

## 2015-01-20 ENCOUNTER — Emergency Department (HOSPITAL_COMMUNITY): Payer: Medicare Other

## 2015-01-20 DIAGNOSIS — Z85828 Personal history of other malignant neoplasm of skin: Secondary | ICD-10-CM | POA: Insufficient documentation

## 2015-01-20 DIAGNOSIS — Z87891 Personal history of nicotine dependence: Secondary | ICD-10-CM | POA: Diagnosis not present

## 2015-01-20 DIAGNOSIS — Z923 Personal history of irradiation: Secondary | ICD-10-CM | POA: Insufficient documentation

## 2015-01-20 DIAGNOSIS — J069 Acute upper respiratory infection, unspecified: Secondary | ICD-10-CM | POA: Insufficient documentation

## 2015-01-20 DIAGNOSIS — Z8619 Personal history of other infectious and parasitic diseases: Secondary | ICD-10-CM | POA: Diagnosis not present

## 2015-01-20 DIAGNOSIS — R58 Hemorrhage, not elsewhere classified: Secondary | ICD-10-CM | POA: Diagnosis not present

## 2015-01-20 DIAGNOSIS — Z86711 Personal history of pulmonary embolism: Secondary | ICD-10-CM | POA: Diagnosis not present

## 2015-01-20 DIAGNOSIS — Z79899 Other long term (current) drug therapy: Secondary | ICD-10-CM | POA: Diagnosis not present

## 2015-01-20 DIAGNOSIS — J45909 Unspecified asthma, uncomplicated: Secondary | ICD-10-CM | POA: Diagnosis not present

## 2015-01-20 DIAGNOSIS — Z792 Long term (current) use of antibiotics: Secondary | ICD-10-CM | POA: Diagnosis not present

## 2015-01-20 DIAGNOSIS — K219 Gastro-esophageal reflux disease without esophagitis: Secondary | ICD-10-CM | POA: Insufficient documentation

## 2015-01-20 DIAGNOSIS — Z8701 Personal history of pneumonia (recurrent): Secondary | ICD-10-CM | POA: Diagnosis not present

## 2015-01-20 DIAGNOSIS — Z8546 Personal history of malignant neoplasm of prostate: Secondary | ICD-10-CM | POA: Diagnosis not present

## 2015-01-20 DIAGNOSIS — R42 Dizziness and giddiness: Secondary | ICD-10-CM | POA: Diagnosis present

## 2015-01-20 DIAGNOSIS — I2510 Atherosclerotic heart disease of native coronary artery without angina pectoris: Secondary | ICD-10-CM | POA: Diagnosis not present

## 2015-01-20 HISTORY — DX: Unspecified asthma, uncomplicated: J45.909

## 2015-01-20 LAB — BASIC METABOLIC PANEL
Anion gap: 9 (ref 5–15)
BUN: 21 mg/dL (ref 6–23)
CALCIUM: 8.5 mg/dL (ref 8.4–10.5)
CO2: 25 mmol/L (ref 19–32)
CREATININE: 0.94 mg/dL (ref 0.50–1.35)
Chloride: 102 mmol/L (ref 96–112)
GFR calc Af Amer: >90 mL/min (ref >90)
GFR, EST NON AFRICAN AMERICAN: 83 mL/min — AB (ref >90)
GLUCOSE: 87 mg/dL (ref 70–99)
POTASSIUM: 3.8 mmol/L (ref 3.5–5.1)
SODIUM: 136 mmol/L (ref 135–145)

## 2015-01-20 LAB — CBC WITH DIFFERENTIAL/PLATELET
BASOS PCT: 1 % (ref 0–1)
Basophils Absolute: 0 10*3/uL (ref 0.0–0.1)
Eosinophils Absolute: 0.2 10*3/uL (ref 0.0–0.7)
Eosinophils Relative: 3 % (ref 0–5)
HEMATOCRIT: 43.1 % (ref 39.0–52.0)
Hemoglobin: 14.6 g/dL (ref 13.0–17.0)
LYMPHS ABS: 0.9 10*3/uL (ref 0.7–4.0)
Lymphocytes Relative: 20 % (ref 12–46)
MCH: 32.3 pg (ref 26.0–34.0)
MCHC: 33.9 g/dL (ref 30.0–36.0)
MCV: 95.4 fL (ref 78.0–100.0)
Monocytes Absolute: 0.3 10*3/uL (ref 0.1–1.0)
Monocytes Relative: 7 % (ref 3–12)
NEUTROS ABS: 3.3 10*3/uL (ref 1.7–7.7)
Neutrophils Relative %: 69 % (ref 43–77)
PLATELETS: 190 10*3/uL (ref 150–400)
RBC: 4.52 MIL/uL (ref 4.22–5.81)
RDW: 13 % (ref 11.5–15.5)
WBC: 4.7 10*3/uL (ref 4.0–10.5)

## 2015-01-20 LAB — PROTIME-INR
INR: 3.12 — AB (ref 0.00–1.49)
Prothrombin Time: 32.3 seconds — ABNORMAL HIGH (ref 11.6–15.2)

## 2015-01-20 LAB — TSH: TSH: 2.643 u[IU]/mL (ref 0.350–4.500)

## 2015-01-20 MED ORDER — IOHEXOL 350 MG/ML SOLN
100.0000 mL | Freq: Once | INTRAVENOUS | Status: AC | PRN
  Administered 2015-01-20: 100 mL via INTRAVENOUS

## 2015-01-20 MED ORDER — SODIUM CHLORIDE 0.9 % IV BOLUS (SEPSIS)
500.0000 mL | Freq: Once | INTRAVENOUS | Status: AC
  Administered 2015-01-20: 500 mL via INTRAVENOUS

## 2015-01-20 NOTE — ED Notes (Signed)
Pt reports not feeling well for extended amount of time, having fatigue and dizziness. Ambulatory at triage. Denies cp or sob.

## 2015-01-20 NOTE — Discharge Instructions (Signed)
Upper Respiratory Infection, Adult An upper respiratory infection (URI) is also sometimes known as the common cold. The upper respiratory tract includes the nose, sinuses, throat, trachea, and bronchi. Bronchi are the airways leading to the lungs. Most people improve within 1 week, but symptoms can last up to 2 weeks. A residual cough may last even longer.  CAUSES Many different viruses can infect the tissues lining the upper respiratory tract. The tissues become irritated and inflamed and often become very moist. Mucus production is also common. A cold is contagious. You can easily spread the virus to others by oral contact. This includes kissing, sharing a glass, coughing, or sneezing. Touching your mouth or nose and then touching a surface, which is then touched by another person, can also spread the virus. SYMPTOMS  Symptoms typically develop 1 to 3 days after you come in contact with a cold virus. Symptoms vary from person to person. They may include:  Runny nose.  Sneezing.  Nasal congestion.  Sinus irritation.  Sore throat.  Loss of voice (laryngitis).  Cough.  Fatigue.  Muscle aches.  Loss of appetite.  Headache.  Low-grade fever. DIAGNOSIS  You might diagnose your own cold based on familiar symptoms, since most people get a cold 2 to 3 times a year. Your caregiver can confirm this based on your exam. Most importantly, your caregiver can check that your symptoms are not due to another disease such as strep throat, sinusitis, pneumonia, asthma, or epiglottitis. Blood tests, throat tests, and X-rays are not necessary to diagnose a common cold, but they may sometimes be helpful in excluding other more serious diseases. Your caregiver will decide if any further tests are required. RISKS AND COMPLICATIONS  You may be at risk for a more severe case of the common cold if you smoke cigarettes, have chronic heart disease (such as heart failure) or lung disease (such as asthma), or if  you have a weakened immune system. The very young and very old are also at risk for more serious infections. Bacterial sinusitis, middle ear infections, and bacterial pneumonia can complicate the common cold. The common cold can worsen asthma and chronic obstructive pulmonary disease (COPD). Sometimes, these complications can require emergency medical care and may be life-threatening. PREVENTION  The best way to protect against getting a cold is to practice good hygiene. Avoid oral or hand contact with people with cold symptoms. Wash your hands often if contact occurs. There is no clear evidence that vitamin C, vitamin E, echinacea, or exercise reduces the chance of developing a cold. However, it is always recommended to get plenty of rest and practice good nutrition. TREATMENT  Treatment is directed at relieving symptoms. There is no cure. Antibiotics are not effective, because the infection is caused by a virus, not by bacteria. Treatment may include:  Increased fluid intake. Sports drinks offer valuable electrolytes, sugars, and fluids.  Breathing heated mist or steam (vaporizer or shower).  Eating chicken soup or other clear broths, and maintaining good nutrition.  Getting plenty of rest.  Using gargles or lozenges for comfort.  Controlling fevers with ibuprofen or acetaminophen as directed by your caregiver.  Increasing usage of your inhaler if you have asthma. Zinc gel and zinc lozenges, taken in the first 24 hours of the common cold, can shorten the duration and lessen the severity of symptoms. Pain medicines may help with fever, muscle aches, and throat pain. A variety of non-prescription medicines are available to treat congestion and runny nose. Your caregiver   can make recommendations and may suggest nasal or lung inhalers for other symptoms.  HOME CARE INSTRUCTIONS   Only take over-the-counter or prescription medicines for pain, discomfort, or fever as directed by your  caregiver.  Use a warm mist humidifier or inhale steam from a shower to increase air moisture. This may keep secretions moist and make it easier to breathe.  Drink enough water and fluids to keep your urine clear or pale yellow.  Rest as needed.  Return to work when your temperature has returned to normal or as your caregiver advises. You may need to stay home longer to avoid infecting others. You can also use a face mask and careful hand washing to prevent spread of the virus. SEEK MEDICAL CARE IF:   After the first few days, you feel you are getting worse rather than better.  You need your caregiver's advice about medicines to control symptoms.  You develop chills, worsening shortness of breath, or brown or red sputum. These may be signs of pneumonia.  You develop yellow or brown nasal discharge or pain in the face, especially when you bend forward. These may be signs of sinusitis.  You develop a fever, swollen neck glands, pain with swallowing, or white areas in the back of your throat. These may be signs of strep throat. SEEK IMMEDIATE MEDICAL CARE IF:   You have a fever.  You develop severe or persistent headache, ear pain, sinus pain, or chest pain.  You develop wheezing, a prolonged cough, cough up blood, or have a change in your usual mucus (if you have chronic lung disease).  You develop sore muscles or a stiff neck. Document Released: 03/31/2001 Document Revised: 12/28/2011 Document Reviewed: 01/10/2014 Exit[employer] Patient Information 2015 [employer]. This information is not intended to replace advice given to you by your health care provider. Make sure you discuss any questions you have with your health care provider.  

## 2015-01-20 NOTE — ED Provider Notes (Signed)
CSN: [id number]     Arrival date & time 01/20/15  1139 History   First MD Initiated Contact with Patient 01/20/15 1216     Chief Complaint  Patient presents with  . Fatigue  . Dizziness     (Consider location/radiation/quality/duration/timing/severity/associated sxs/prior Treatment) Patient is a 69 y.o. male presenting with dizziness. The history is provided by the patient.  Dizziness Associated symptoms: no chest pain, no diarrhea, no headaches, no nausea, no shortness of breath, no vomiting and no weakness    patient has had a cough for the last 6 weeks. States she's been feeling a little bad with it. Has been on steroids a couple times to his primary care doctor. 3 weeks who had a negative x-ray. Also been a little more sleepy. States he is able to do a normal amount of things to do in the day. Becomes tired a little earlier. He states he sleeps around the same hours of sleep she just gets up little earlier also. No weight loss. No weight gain. He is on Coumadin for previous pulmonary embolism. Previous prostate cancer. Minimal production with cough. No wheezes. States he will feel little unsteady at times. States he will have to catch himself.  Past Medical History  Diagnosis Date  . Clotting disorder   . Cancer   . GERD (gastroesophageal reflux disease)   . 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 Skin cancer     melanoma in situ, left arm, s/p local wide excision  . Pulmonary embolism 2001, 2011  . [place] spotted fever 2006  . Pneumonia 2012  . History of radiation therapy 12/14/13- 01/30/14    prostate bed 6600 cGy in 33 sessions  . Asthma    Past Surgical History  Procedure Laterality Date  . Hernia repair Right 2012  . Prostatectomy  11/08/09    gleason 6  . Prostate biopsy  01/28/2009    gleason 3+3=6  . Knee arthroscopy Left 2010  . Melanoma excision Left 2000   Family History  Problem Relation Age of Onset  . Cancer Paternal Grandmother 41    colon   . Cancer Paternal Grandfather 21    prostate   History  Substance Use Topics  . Smoking status: Former Smoker    Quit date: 11/27/1968  . Smokeless tobacco: Never Used  . Alcohol Use: 4.2 oz/week    7 Shots of liquor per week    Review of Systems  Constitutional: Positive for fatigue. Negative for activity change.  Eyes: Negative for pain.  Respiratory: Positive for cough. Negative for chest tightness and shortness of breath.   Cardiovascular: Negative for chest pain and leg swelling.  Gastrointestinal: Negative for nausea, vomiting, abdominal pain and diarrhea.  Genitourinary: Negative for flank pain.  Musculoskeletal: Negative for back pain and neck stiffness.  Skin: Negative for rash.  Neurological: Positive for dizziness. Negative for weakness, numbness and headaches.  Hematological: Bruises/bleeds easily.  Psychiatric/Behavioral: Negative for behavioral problems.      Allergies  Review of patient's allergies indicates no known allergies.  Home Medications   Prior to Admission medications   Medication Sig Start Date End Date Taking? Authorizing Provider  famotidine (PEPCID) 20 MG tablet Take 20 mg by mouth daily.   Yes Historical Provider, MD  predniSONE (DELTASONE) 20 MG tablet 2 tabs x 3 days, 1 tab x 3 days, 1/2 tab x 3 days, 1/2 tab M,W,F x 2 weeks 12/24/14  Yes Dorena Cookey, MD  timolol (TIMOPTIC-XR) 0.5 % ophthalmic gel-forming Place 1 drop into both eyes every other day.  07/17/11  Yes Historical Provider, MD  warfarin (COUMADIN) 5 MG tablet TAKE AS DIRECTED BY COUMADIN CLINIC Patient taking differently: Take 2.5-5 mg by mouth daily at 6 PM. Takes 2.5mg  on Sun and Wed Takes 5mg  all other days 10/25/14  Yes Dorena Cookey, MD  doxycycline (VIBRA-TABS) 100 MG tablet Take 1 tablet (100 mg total) by mouth 2 (two) times daily. 12/29/14   Tonia Ghent, MD  HYDROcodone-homatropine [facility]) 5-1.5 MG/5ML syrup Take 5 mLs by mouth every 8 (eight) hours as needed. 01/01/15    Dorena Cookey, MD  sildenafil (VIAGRA) 100 MG tablet Take 0.5-1 tablets (50-100 mg total) by mouth daily as needed for erectile dysfunction. 10/25/14   Dorena Cookey, MD  tadalafil (CIALIS) 20 MG tablet Take 0.5-1 tablets (10-20 mg total) by mouth every other day as needed for erectile dysfunction. 10/25/14   Dorena Cookey, MD   BP 130/73 mmHg  Pulse 62  Temp(Src) 99 F (37.2 C) (Oral)  Resp 21  Ht 5\' 11"  (1.803 m)  Wt 182 lb (82.555 kg)  BMI 25.40 kg/m2  SpO2 95% Physical Exam  Constitutional: He appears well-developed and well-nourished.  HENT:  Head: Normocephalic and atraumatic.  Neck: Normal range of motion.  Cardiovascular: Normal rate and regular rhythm.   No murmur heard. Pulmonary/Chest: Effort normal. He has rales.  Rales to right base.  Abdominal: Soft. Bowel sounds are normal. He exhibits no distension and no mass. There is no tenderness. There is no rebound and no guarding.  Musculoskeletal: Normal range of motion. He exhibits no edema.  Neurological: He is alert. No cranial nerve deficit.  Skin: Skin is warm and dry.  Psychiatric: He has a normal mood and affect.  Nursing note and vitals reviewed.   ED Course  Procedures (including critical care time) Labs Review Labs Reviewed  PROTIME-INR - Abnormal; Notable for the following:    Prothrombin Time 32.3 (*)    INR 3.12 (*)    All other components within normal limits  BASIC METABOLIC PANEL - Abnormal; Notable for the following:    GFR calc non Af Amer 83 (*)    All other components within normal limits  TSH  CBC WITH DIFFERENTIAL/PLATELET    Imaging Review Ct Angio Chest Pe W/cm &/or Wo Cm  01/20/2015   CLINICAL DATA:  Fatigue. History of pulmonary embolism. Dizziness. History of prostate cancer.  EXAM: CT ANGIOGRAPHY CHEST WITH CONTRAST  TECHNIQUE: Multidetector CT imaging of the chest was performed using the standard protocol during bolus administration of intravenous contrast. Multiplanar CT image  reconstructions and MIPs were obtained to evaluate the vascular anatomy.  CONTRAST:  16mL OMNIPAQUE IOHEXOL 350 MG/ML SOLN  COMPARISON:  12/31/2014 chest radiograph.  07/01/2010 CT.  FINDINGS: Mediastinum/Nodes: The quality of this exam for evaluation of pulmonary embolism is moderate. Primarily limitation is motion at the lung bases. No embolism to the large segmental level with smaller emboli unable to be excluded, primarily at the lung bases. Aortic and branch vessel atherosclerosis. Tortuous thoracic aorta, without dissection or aneurysm. Mild cardiomegaly. LAD coronary artery atherosclerosis. No mediastinal or hilar adenopathy. A small hiatal hernia.  Lungs/Pleura: Trace right pleural fluid or thickening, decreased. Mild motion degradation. Left lung base minimally excluded. Mild dependent subsegmental atelectasis, most pronounced at the right lung base.  Upper abdomen: Left hepatic lobe cyst. Normal imaged portions of the spleen.  Musculoskeletal: No acute  osseous abnormality.  Review of the MIP images confirms the above findings.  IMPRESSION: 1. Motion degraded evaluation of the lung bases. No pulmonary embolism with limitations detailed above. 2.  Atherosclerosis, including within the coronary arteries. 3. Small hiatal hernia.   Electronically Signed   By: Abigail Miyamoto M.D.   On: 01/20/2015 15:28     EKG Interpretation   Date/Time:  Sunday January 20 2015 12:00:22 EDT Ventricular Rate:  64 PR Interval:  138 QRS Duration: 82 QT Interval:  399 QTC Calculation: 412 R Axis:   -10 Text Interpretation:  Sinus rhythm Probable left atrial enlargement Low  voltage, precordial leads Baseline wander in lead(s) V3 Confirmed by  Alvino Chapel  MD, Ovid Curd [phone]) on 01/20/2015 12:44:35 PM      MDM   Final diagnoses:  URI (upper respiratory infection)    Patient with cough for the last 6 weeks. Likely URI. CT scan done due to previous pulmonary embolism history and the fact that he had some rales in the  left base. Normal white count and CT scan is reassuring. Will discharge home.    Davonna Belling, MD 01/20/15 [id number]

## 2015-01-23 ENCOUNTER — Encounter: Payer: Self-pay | Admitting: Internal Medicine

## 2015-01-28 DIAGNOSIS — C61 Malignant neoplasm of prostate: Secondary | ICD-10-CM | POA: Diagnosis not present

## 2015-02-13 ENCOUNTER — Encounter: Payer: Self-pay | Admitting: Internal Medicine

## 2015-02-14 ENCOUNTER — Ambulatory Visit (INDEPENDENT_AMBULATORY_CARE_PROVIDER_SITE_OTHER): Payer: Medicare Other | Admitting: General Practice

## 2015-02-14 DIAGNOSIS — Z5181 Encounter for therapeutic drug level monitoring: Secondary | ICD-10-CM | POA: Diagnosis not present

## 2015-02-14 DIAGNOSIS — I2699 Other pulmonary embolism without acute cor pulmonale: Secondary | ICD-10-CM

## 2015-02-14 LAB — POCT INR: INR: 1.8

## 2015-02-14 NOTE — Progress Notes (Signed)
Pre visit review using our clinic review tool, if applicable. No additional management support is needed unless otherwise documented below in the visit note. 

## 2015-03-13 ENCOUNTER — Encounter: Payer: Self-pay | Admitting: Physician Assistant

## 2015-03-28 ENCOUNTER — Ambulatory Visit (INDEPENDENT_AMBULATORY_CARE_PROVIDER_SITE_OTHER): Payer: Medicare Other | Admitting: General Practice

## 2015-03-28 DIAGNOSIS — I2699 Other pulmonary embolism without acute cor pulmonale: Secondary | ICD-10-CM | POA: Diagnosis not present

## 2015-03-28 DIAGNOSIS — Z5181 Encounter for therapeutic drug level monitoring: Secondary | ICD-10-CM

## 2015-03-28 LAB — POCT INR: INR: 2.1

## 2015-03-28 NOTE — Progress Notes (Signed)
Pre visit review using our clinic review tool, if applicable. No additional management support is needed unless otherwise documented below in the visit note. 

## 2015-04-05 ENCOUNTER — Encounter: Payer: Self-pay | Admitting: Physician Assistant

## 2015-04-05 ENCOUNTER — Telehealth: Payer: Self-pay | Admitting: *Deleted

## 2015-04-05 ENCOUNTER — Other Ambulatory Visit: Payer: Self-pay | Admitting: *Deleted

## 2015-04-05 ENCOUNTER — Ambulatory Visit (INDEPENDENT_AMBULATORY_CARE_PROVIDER_SITE_OTHER): Payer: Medicare Other | Admitting: Physician Assistant

## 2015-04-05 VITALS — BP 110/74 | HR 64 | Ht 69.0 in | Wt 188.1 lb

## 2015-04-05 DIAGNOSIS — Z86010 Personal history of colonic polyps: Secondary | ICD-10-CM | POA: Diagnosis not present

## 2015-04-05 DIAGNOSIS — Z7901 Long term (current) use of anticoagulants: Secondary | ICD-10-CM

## 2015-04-05 MED ORDER — NA SULFATE-K SULFATE-MG SULF 17.5-3.13-1.6 GM/177ML PO SOLN: 1.0000 | Freq: Once | ORAL | Status: AC

## 2015-04-05 NOTE — Patient Instructions (Signed)
You have been scheduled for a colonoscopy. Please follow written instructions given to you at your visit today.  Please pick up your prep supplies at the pharmacy within the next 1-3 days. [facility] [address].  If you use inhalers (even only as needed), please bring them with you on the day of your procedure.

## 2015-04-05 NOTE — Telephone Encounter (Signed)
04/05/2015   RE: Donald Villegas DOB: 1946/08/24 MRN: [id number]   Dear Meriam Sprague RN,    We have scheduled the above patient for an endoscopic procedure. Our records show that he is on anticoagulation therapy.   Please advise as to how long the patient may come off his therapy of Coumadin prior to the procedure, which is scheduled for 06-18-2015. He may also need a Lovenox bridge.   Please fax back/ or route the completed form to [place] at [phone].   Sincerely,    Amy Esterwood PA-C Marisue Humble CMA

## 2015-04-05 NOTE — Progress Notes (Signed)
Patient ID: Donald Villegas, male   DOB: 12-Aug-1946, 69 y.o.   MRN: [id number]   Subjective:    Patient ID: Donald Villegas, male    DOB: 1946/04/13, 69 y.o.   MRN: [id number]  HPI Donald Villegas is a pleasant 69 year old white male known to Dr. Henrene Pastor with history of adenomatous polyps. He comes in today to schedule follow-up colonoscopy. His last colonoscopy was in April 2011 he had 2 polyps removed, both of which were tubular adenomas. He was recommended to have 5 year interval follow-up. He has no current GI complaints, no complaints of abdominal pain and changes in bowel habits melena or hematochezia. Since his last colonoscopy he has been placed on chronic Coumadin for history of recurrent pulmonary emboli. He had an episode in 2012 and prior to that had had one 6-7 years previous. Is followed by the Coumadin clinic at [facility] primary care, Dr. Sherren Mocha is his PCP. He has done Lovenox bridging in the past.  Review of Systems Pertinent positive and negative review of systems were noted in the above HPI section.  All other review of systems was otherwise negative.  Outpatient Encounter Prescriptions as of 04/05/2015  Medication Sig  . famotidine (PEPCID) 20 MG tablet Take 20 mg by mouth daily.  . predniSONE (DELTASONE) 20 MG tablet 2 tabs x 3 days, 1 tab x 3 days, 1/2 tab x 3 days, 1/2 tab M,W,F x 2 weeks  . sildenafil (VIAGRA) 100 MG tablet Take 0.5-1 tablets (50-100 mg total) by mouth daily as needed for erectile dysfunction.  . tadalafil (CIALIS) 20 MG tablet Take 0.5-1 tablets (10-20 mg total) by mouth every other day as needed for erectile dysfunction.  . timolol (TIMOPTIC-XR) 0.5 % ophthalmic gel-forming Place 1 drop into both eyes every other day.   . warfarin (COUMADIN) 5 MG tablet TAKE AS DIRECTED BY COUMADIN CLINIC (Patient taking differently: Take 2.5-5 mg by mouth daily at 6 PM. Takes 2.48m on Sun and Wed Takes 523mall other days)  . Na Sulfate-K Sulfate-Mg Sulf SOLN Take 1 kit by mouth once.  .  [DISCONTINUED] doxycycline (VIBRA-TABS) 100 MG tablet Take 1 tablet (100 mg total) by mouth 2 (two) times daily.  . [DISCONTINUED] HYDROcodone-homatropine (HYCODAN) 5-1.5 MG/5ML syrup Take 5 mLs by mouth every 8 (eight) hours as needed.   No facility-administered encounter medications on file as of 04/05/2015.   No Known Allergies Patient Active Problem List   Diagnosis Date Noted  . Cough 12/29/2014  . 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 Encounter for therapeutic drug monitoring 12/26/2013  . Prostate cancer   . Viral URI 09/09/2011  . History of coagulopathy 08/20/2011  . Pulmonary embolism 12/08/2010  . CHEST PAIN 07/01/2010  . Other dyspnea and respiratory abnormality 05/01/2010  . GERD 04/25/2010  . PULMONARY EMBOLISM, HX OF 04/25/2010  . PNEUMONIA, HX OF 04/25/2010   History   Social History  . Marital Status: Married    Spouse Name: N/A  . Number of Children: N/A  . Years of Education: N/A   Occupational History  . Not on file.   Social History Main Topics  . Smoking status: Former Smoker    Quit date: 11/27/1968  . Smokeless tobacco: Never Used  . Alcohol Use: 4.2 oz/week    7 Shots of liquor per week  . Drug Use: No  . Sexual Activity: Not on file   Other Topics Concern  . Not on file   Social History Narrative    Mr. Karl's family history includes  Colon cancer (age of onset: 66) in his paternal grandmother; Prostate cancer (age of onset: 68) in his paternal grandfather.      Objective:    Filed Vitals:   04/05/15 0844  BP: 110/74  Pulse: 64    Physical Exam  well-developed older white male in no acute distress, pleasant blood pressure 110/74 pulse 64 height 5 foot 9 weight 188 HEENT; nontraumatic normocephalic EOMI PERRLA sclera anicteric, Cardiovascular; regular rate and rhythm with S1-S2 no murmur or gallop, Pulmonary; clear bilaterally, Abdomen ;soft nontender nondistended L sounds are present no palpable mass or hepatosplenomegaly, Rectal ;exam not  done, Ext;no clubbing cyanosis or edema skin warm and dry, Psych; mood and affect appropriate       Assessment & Plan:   #1 69 yo male with hx of adenomatous polyps ,due for follow up colonoscopy.  #2 hx of recurrent Pulmonary Emboli #3 chronic anticoagulation - on coumadin  Plan; Pt will be schedule for Colonoscopy with Dr. Henrene Pastor - procedure discussed in detail with pt and he is agreeable to proceed.  We will communicate with pt's PCP and the Coumadin clinic to arrange holding Coumadin for 5 days prior to procedure and Lovenox bridge.   Amy S Esterwood PA-C 04/05/2015   Cc: Dorena Cookey, MD

## 2015-04-05 NOTE — Telephone Encounter (Signed)
RE: Donald Villegas DOB: 04-29-1946 MRN: [id number]   Dear Dr. Stevie Kern,,    We have scheduled the above patient for an endoscopic procedure. Our records show that he is on anticoagulation therapy.   Please advise as to how long the patient may come off his therapy of Coumadin  prior to the procedure, which is scheduled for 06-18-2015. He may also need a Lovenox bridge. We also routed a letter to Meriam Sprague RN Coumadin clinic at the [facility] office.  Please fax back/ or route the completed form to [facility] [phone].   Sincerely,    Amy Esterwood PA-C Marisue Humble CMA

## 2015-04-09 NOTE — Progress Notes (Signed)
Agree with assessment and plans as outlined. 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

## 2015-04-16 ENCOUNTER — Telehealth: Payer: Self-pay | Admitting: General Practice

## 2015-04-16 NOTE — Telephone Encounter (Signed)
-----   Message from Marletta Lor, MD sent at 04/15/2015  5:03 PM EDT ----- Lovenox bridge not required   ----- Message -----    From: Warden Fillers, RN    Sent: 04/15/2015   1:33 PM      To: Marletta Lor, MD  Hi Dr. Raliegh Ip,  Patient will be stopping Lovenox for a endoscopic procedure on 8/30.  Do you feel that a Lovenox bridge will be needed? He is a patient of Dr. Sherren Mocha.  Thanks, Villa Herb, RN

## 2015-04-19 NOTE — Telephone Encounter (Signed)
Called patient to give him the coumadin instructions for his colonoscopy.  He was scheduled for 06-18-2015 and changed his appointment to 07-10-2015 at 8:30 am Ibuprofen did advised he is to hold the coumadin 5 days prior to the procedure. Patient verbalized understanding the instructions.

## 2015-05-09 ENCOUNTER — Ambulatory Visit (INDEPENDENT_AMBULATORY_CARE_PROVIDER_SITE_OTHER): Payer: Medicare Other | Admitting: General Practice

## 2015-05-09 DIAGNOSIS — Z5181 Encounter for therapeutic drug level monitoring: Secondary | ICD-10-CM | POA: Diagnosis not present

## 2015-05-09 DIAGNOSIS — I2699 Other pulmonary embolism without acute cor pulmonale: Secondary | ICD-10-CM | POA: Diagnosis not present

## 2015-05-09 LAB — POCT INR: INR: 2.4

## 2015-05-09 NOTE — Progress Notes (Signed)
Pre visit review using our clinic review tool, if applicable. No additional management support is needed unless otherwise documented below in the visit note. 

## 2015-05-16 ENCOUNTER — Other Ambulatory Visit: Payer: Self-pay | Admitting: Radiation Oncology

## 2015-05-16 DIAGNOSIS — C61 Malignant neoplasm of prostate: Secondary | ICD-10-CM

## 2015-05-17 ENCOUNTER — Ambulatory Visit
Admission: RE | Admit: 2015-05-17 | Discharge: 2015-05-17 | Disposition: A | Discharge status: Home / Self Care | Payer: Medicare Other | Source: Ambulatory Visit | Attending: Radiation Oncology | Admitting: Radiation Oncology

## 2015-05-17 DIAGNOSIS — C61 Malignant neoplasm of prostate: Secondary | ICD-10-CM | POA: Diagnosis not present

## 2015-05-18 LAB — PSA: PSA: 0.23 ng/mL (ref <=4.00)

## 2015-05-21 ENCOUNTER — Encounter: Payer: Medicare Other | Admitting: Gastroenterology

## 2015-05-22 DIAGNOSIS — H01001 Unspecified blepharitis right upper eyelid: Secondary | ICD-10-CM | POA: Diagnosis not present

## 2015-05-22 DIAGNOSIS — D2311 Other benign neoplasm of skin of right eyelid, including canthus: Secondary | ICD-10-CM | POA: Diagnosis not present

## 2015-05-22 DIAGNOSIS — H40053 Ocular hypertension, bilateral: Secondary | ICD-10-CM | POA: Diagnosis not present

## 2015-05-22 DIAGNOSIS — H3531 Nonexudative age-related macular degeneration: Secondary | ICD-10-CM | POA: Diagnosis not present

## 2015-06-18 ENCOUNTER — Encounter: Payer: Medicare Other | Admitting: Internal Medicine

## 2015-06-20 ENCOUNTER — Ambulatory Visit (INDEPENDENT_AMBULATORY_CARE_PROVIDER_SITE_OTHER): Payer: Medicare Other | Admitting: General Practice

## 2015-06-20 DIAGNOSIS — Z5181 Encounter for therapeutic drug level monitoring: Secondary | ICD-10-CM

## 2015-06-20 DIAGNOSIS — I2699 Other pulmonary embolism without acute cor pulmonale: Secondary | ICD-10-CM | POA: Diagnosis not present

## 2015-06-20 LAB — POCT INR: INR: 3.2

## 2015-06-20 NOTE — Progress Notes (Signed)
Pre visit review using our clinic review tool, if applicable. No additional management support is needed unless otherwise documented below in the visit note. 

## 2015-06-30 ENCOUNTER — Other Ambulatory Visit: Payer: Self-pay | Admitting: Family Medicine

## 2015-07-02 ENCOUNTER — Other Ambulatory Visit: Payer: Self-pay | Admitting: General Practice

## 2015-07-02 ENCOUNTER — Other Ambulatory Visit: Payer: Self-pay | Admitting: Family Medicine

## 2015-07-02 MED ORDER — WARFARIN SODIUM 5 MG PO TABS: ORAL_TABLET | ORAL | Status: DC

## 2015-07-07 ENCOUNTER — Other Ambulatory Visit: Payer: Self-pay | Admitting: Radiation Oncology

## 2015-07-07 DIAGNOSIS — C61 Malignant neoplasm of prostate: Secondary | ICD-10-CM

## 2015-07-09 ENCOUNTER — Ambulatory Visit
Admission: RE | Admit: 2015-07-09 | Discharge: 2015-07-09 | Disposition: A | Discharge status: Home / Self Care | Payer: Medicare Other | Source: Ambulatory Visit | Attending: Radiation Oncology | Admitting: Radiation Oncology

## 2015-07-09 DIAGNOSIS — C61 Malignant neoplasm of prostate: Secondary | ICD-10-CM | POA: Insufficient documentation

## 2015-07-10 ENCOUNTER — Ambulatory Visit (AMBULATORY_SURGERY_CENTER): Payer: Medicare Other | Admitting: Internal Medicine

## 2015-07-10 ENCOUNTER — Encounter: Payer: Self-pay | Admitting: Internal Medicine

## 2015-07-10 VITALS — BP 126/86 | HR 50 | Temp 97.7°F | Resp 24 | Ht 69.0 in | Wt 188.0 lb

## 2015-07-10 DIAGNOSIS — D122 Benign neoplasm of ascending colon: Secondary | ICD-10-CM | POA: Diagnosis not present

## 2015-07-10 DIAGNOSIS — Z860100 Personal history of colon polyps, unspecified: Secondary | ICD-10-CM

## 2015-07-10 DIAGNOSIS — J45909 Unspecified asthma, uncomplicated: Secondary | ICD-10-CM | POA: Diagnosis not present

## 2015-07-10 HISTORY — PX: COLONOSCOPY: SHX174

## 2015-07-10 LAB — PSA: PSA: 0.27 ng/mL (ref <=4.00)

## 2015-07-10 MED ORDER — SODIUM CHLORIDE 0.9 % IV SOLN
500.0000 mL | INTRAVENOUS | Status: DC
Start: 2015-07-10 — End: 2015-07-10

## 2015-07-10 NOTE — Progress Notes (Signed)
Transferred to recovery room. A/O x3, pleased with MAC.  VSS.  Report to Annette, RN. 

## 2015-07-10 NOTE — Progress Notes (Signed)
Called to room to assist during endoscopic procedure.  Patient ID and intended procedure confirmed with present staff. Received instructions for my participation in the procedure from the performing physician.  

## 2015-07-10 NOTE — Op Note (Signed)
[place]  [address] [place], [id number]   COLONOSCOPY PROCEDURE REPORT  PATIENT: Donald Villegas, Donald Villegas  MR#: [id number] BIRTHDATE: 05/19/1946 , 69  yrs. old GENDER: male ENDOSCOPIST: Eustace Quail, MD REFERRED [id number] Program Recall PROCEDURE DATE:  07/10/2015 PROCEDURE:   Colonoscopy, surveillance and Colonoscopy with snare polypectomy x 1 First Screening Colonoscopy - Avg.  risk and is 50 yrs.  old or older - No.  Prior Negative Screening - Now for repeat screening. N/A  History of Adenoma - Now for follow-up colonoscopy & has been > or = to 3 yrs.  Yes hx of adenoma.  Has been 3 or more years since last colonoscopy.  Polyps removed today? Yes ASA CLASS:   Class III INDICATIONS:Surveillance due to prior colonic neoplasia and PH Colon Adenoma.   . Previous exam April 2011 with small tubular adenoma MEDICATIONS: Monitored anesthesia care and Propofol 175 mg IV  DESCRIPTION OF PROCEDURE:   After the risks benefits and alternatives of the procedure were thoroughly explained, informed consent was obtained.  The digital rectal exam revealed no abnormalities of the rectum.   The LB [id number]  endoscope was introduced through the anus and advanced to the cecum, which was identified by both the appendix and ileocecal valve. No adverse events experienced.   The quality of the prep was excellent. (Suprep was used)  The instrument was then slowly withdrawn as the colon was fully examined. Estimated blood loss is zero unless otherwise noted in this procedure report.  COLON FINDINGS: A sessile polyp measuring 6 mm in size was found in the ascending colon.  A polypectomy was performed with a cold snare.  The resection was complete, the polyp tissue was completely retrieved and sent to histology.   Mild radiation proctitis.   The examination was otherwise normal.  Retroflexed views revealed internal hemorrhoids. The time to cecum = 3.1 Withdrawal time =  8.8 The scope was withdrawn and the procedure completed. COMPLICATIONS: There were no immediate complications.  ENDOSCOPIC IMPRESSION: 1.   Sessile polyp was found in the ascending colon; polypectomy was performed with a cold snare 2.   Mild radiation proctitis 3.   The examination was otherwise normal  RECOMMENDATIONS: 1.  Follow up colonoscopy in 5 years 2.  Resume Coumadin (warfarin) today and have your PT/INR checked within 1 week.  eSigned:  Eustace Quail, MD 07/10/2015 9:22 AM   cc: The Patient and Dorena Cookey, MD

## 2015-07-10 NOTE — Progress Notes (Signed)
No problems noted in the recovery room. maw 

## 2015-07-10 NOTE — Patient Instructions (Addendum)
YOU HAD AN ENDOSCOPIC PROCEDURE TODAY AT [facility]:   Refer to the procedure report that was given to you for any specific questions about what was found during the examination.  If the procedure report does not answer your questions, please call your gastroenterologist to clarify.  If you requested that your care partner not be given the details of your procedure findings, then the procedure report has been included in a sealed envelope for you to review at your convenience later.  YOU SHOULD EXPECT: Some feelings of bloating in the abdomen. Passage of more gas than usual.  Walking can help get rid of the air that was put into your GI tract during the procedure and reduce the bloating. If you had a lower endoscopy (such as a colonoscopy or flexible sigmoidoscopy) you may notice spotting of blood in your stool or on the toilet paper. If you underwent a bowel prep for your procedure, you may not have a normal bowel movement for a few days.  Please Note:  You might notice some irritation and congestion in your nose or some drainage.  This is from the oxygen used during your procedure.  There is no need for concern and it should clear up in a day or so.  SYMPTOMS TO REPORT IMMEDIATELY:   Following lower endoscopy (colonoscopy or flexible sigmoidoscopy):  Excessive amounts of blood in the stool  Significant tenderness or worsening of abdominal pains  Swelling of the abdomen that is new, acute  Fever of 100F or higher   For urgent or emergent issues, a gastroenterologist can be reached at any hour by calling [phone].   DIET: Your first meal following the procedure should be a small meal and then it is ok to progress to your normal diet. Heavy or fried foods are harder to digest and may make you feel nauseous or bloated.  Likewise, meals heavy in dairy and vegetables can increase bloating.  Drink plenty of fluids but you should avoid alcoholic beverages for 24  hours.  ACTIVITY:  You should plan to take it easy for the rest of today and you should NOT DRIVE or use heavy machinery until tomorrow (because of the sedation medicines used during the test).    FOLLOW UP: Our staff will call the number listed on your records the next business day following your procedure to check on you and address any questions or concerns that you may have regarding the information given to you following your procedure. If we do not reach you, we will leave a message.  However, if you are feeling well and you are not experiencing any problems, there is no need to return our call.  We will assume that you have returned to your regular daily activities without incident.  If any biopsies were taken you will be contacted by phone or by letter within the next 1-3 weeks.  Please call us at [phone] if you have not heard about the biopsies in 3 weeks.    SIGNATURES/CONFIDENTIALITY: You and/or your care partner have signed paperwork which will be entered into your electronic medical record.  These signatures attest to the fact that that the information above on your After Visit Summary has been reviewed and is understood.  Full responsibility of the confidentiality of this discharge information lies with you and/or your care-partner.    Handout was given to your care partner on polyps. Resume coumadin today and have your PT/INR checked within 1 weeks. You may  resume your current medications today. Await biopsy results. Please call if any questions or concerns.   

## 2015-07-11 ENCOUNTER — Telehealth: Payer: Self-pay | Admitting: Emergency Medicine

## 2015-07-11 DIAGNOSIS — L570 Actinic keratosis: Secondary | ICD-10-CM | POA: Diagnosis not present

## 2015-07-11 DIAGNOSIS — Z85828 Personal history of other malignant neoplasm of skin: Secondary | ICD-10-CM | POA: Diagnosis not present

## 2015-07-11 DIAGNOSIS — L820 Inflamed seborrheic keratosis: Secondary | ICD-10-CM | POA: Diagnosis not present

## 2015-07-11 DIAGNOSIS — L821 Other seborrheic keratosis: Secondary | ICD-10-CM | POA: Diagnosis not present

## 2015-07-11 DIAGNOSIS — Z85820 Personal history of malignant melanoma of skin: Secondary | ICD-10-CM | POA: Diagnosis not present

## 2015-07-11 NOTE — Telephone Encounter (Signed)
  Follow up Call-  Call back number 07/10/2015  Post procedure Call Back phone  # [phone]  Permission to leave phone message Yes     Patient questions:  Do you have a fever, pain , or abdominal swelling? No. Pain Score  0 *  Have you tolerated food without any problems? Yes.    Have you been able to return to your normal activities? Yes.    Do you have any questions about your discharge instructions: Diet   No. Medications  No. Follow up visit  No.  Do you have questions or concerns about your Care? No.  Actions: * If pain score is 4 or above: No action needed, pain <4.

## 2015-07-12 DIAGNOSIS — C61 Malignant neoplasm of prostate: Secondary | ICD-10-CM | POA: Diagnosis not present

## 2015-07-15 ENCOUNTER — Encounter: Payer: Self-pay | Admitting: Internal Medicine

## 2015-08-01 ENCOUNTER — Ambulatory Visit (INDEPENDENT_AMBULATORY_CARE_PROVIDER_SITE_OTHER): Payer: Medicare Other | Admitting: General Practice

## 2015-08-01 DIAGNOSIS — I2699 Other pulmonary embolism without acute cor pulmonale: Secondary | ICD-10-CM

## 2015-08-01 DIAGNOSIS — Z5181 Encounter for therapeutic drug level monitoring: Secondary | ICD-10-CM

## 2015-08-01 LAB — POCT INR: INR: 2.4

## 2015-08-01 NOTE — Progress Notes (Signed)
Pre visit review using our clinic review tool, if applicable. No additional management support is needed unless otherwise documented below in the visit note. 

## 2015-08-09 NOTE — Progress Notes (Signed)
I agree with this plan.

## 2015-09-19 ENCOUNTER — Ambulatory Visit (INDEPENDENT_AMBULATORY_CARE_PROVIDER_SITE_OTHER): Payer: Medicare Other | Admitting: General Practice

## 2015-09-19 DIAGNOSIS — Z5181 Encounter for therapeutic drug level monitoring: Secondary | ICD-10-CM

## 2015-09-19 DIAGNOSIS — I2699 Other pulmonary embolism without acute cor pulmonale: Secondary | ICD-10-CM | POA: Diagnosis not present

## 2015-09-19 LAB — POCT INR: INR: 2.7

## 2015-09-19 NOTE — Progress Notes (Signed)
Pre visit review using our clinic review tool, if applicable. No additional management support is needed unless otherwise documented below in the visit note. 

## 2015-09-20 DIAGNOSIS — Z23 Encounter for immunization: Secondary | ICD-10-CM | POA: Diagnosis not present

## 2015-10-31 ENCOUNTER — Ambulatory Visit (INDEPENDENT_AMBULATORY_CARE_PROVIDER_SITE_OTHER): Payer: Medicare Other | Admitting: General Practice

## 2015-10-31 ENCOUNTER — Telehealth: Payer: Self-pay | Admitting: Family Medicine

## 2015-10-31 DIAGNOSIS — I2699 Other pulmonary embolism without acute cor pulmonale: Secondary | ICD-10-CM

## 2015-10-31 DIAGNOSIS — Z5181 Encounter for therapeutic drug level monitoring: Secondary | ICD-10-CM

## 2015-10-31 LAB — POCT INR: INR: 2.2

## 2015-10-31 NOTE — Telephone Encounter (Signed)
Does he need an establishment visit if not he wants to schedule a physical.

## 2015-10-31 NOTE — Progress Notes (Signed)
Pre visit review using our clinic review tool, if applicable. No additional management support is needed unless otherwise documented below in the visit note. 

## 2015-10-31 NOTE — Telephone Encounter (Signed)
Yes I agreed to see him, thanks

## 2015-10-31 NOTE — Telephone Encounter (Signed)
Pt said Dr Sarajane Jews told him that he would except him as a transfer pt from Dr Sherren Mocha. Just verifying that this is correct

## 2015-11-01 NOTE — Telephone Encounter (Signed)
He can just schedule the physical

## 2015-11-26 DIAGNOSIS — C61 Malignant neoplasm of prostate: Secondary | ICD-10-CM | POA: Diagnosis not present

## 2015-11-28 ENCOUNTER — Ambulatory Visit (INDEPENDENT_AMBULATORY_CARE_PROVIDER_SITE_OTHER): Payer: Medicare Other | Admitting: Family Medicine

## 2015-11-28 ENCOUNTER — Encounter: Payer: Self-pay | Admitting: Family Medicine

## 2015-11-28 VITALS — BP 135/80 | HR 61 | Temp 98.4°F | Ht 69.0 in | Wt 188.0 lb

## 2015-11-28 DIAGNOSIS — J45909 Unspecified asthma, uncomplicated: Secondary | ICD-10-CM | POA: Diagnosis not present

## 2015-11-28 DIAGNOSIS — I2699 Other pulmonary embolism without acute cor pulmonale: Secondary | ICD-10-CM | POA: Diagnosis not present

## 2015-11-28 DIAGNOSIS — E785 Hyperlipidemia, unspecified: Secondary | ICD-10-CM | POA: Diagnosis not present

## 2015-11-28 DIAGNOSIS — K219 Gastro-esophageal reflux disease without esophagitis: Secondary | ICD-10-CM | POA: Diagnosis not present

## 2015-11-28 DIAGNOSIS — C61 Malignant neoplasm of prostate: Secondary | ICD-10-CM

## 2015-11-28 LAB — CBC WITH DIFFERENTIAL/PLATELET
BASOS PCT: 0.7 % (ref 0.0–3.0)
Basophils Absolute: 0 10*3/uL (ref 0.0–0.1)
EOS PCT: 2.6 % (ref 0.0–5.0)
Eosinophils Absolute: 0.2 10*3/uL (ref 0.0–0.7)
HEMATOCRIT: 50.7 % (ref 39.0–52.0)
HEMOGLOBIN: 16.7 g/dL (ref 13.0–17.0)
Lymphocytes Relative: 23.9 % (ref 12.0–46.0)
Lymphs Abs: 1.5 10*3/uL (ref 0.7–4.0)
MCHC: 32.9 g/dL (ref 30.0–36.0)
MCV: 98.1 fl (ref 78.0–100.0)
MONO ABS: 0.5 10*3/uL (ref 0.1–1.0)
MONOS PCT: 7.9 % (ref 3.0–12.0)
Neutro Abs: 4.1 10*3/uL (ref 1.4–7.7)
Neutrophils Relative %: 64.9 % (ref 43.0–77.0)
Platelets: 206 10*3/uL (ref 150.0–400.0)
RBC: 5.17 Mil/uL (ref 4.22–5.81)
RDW: 13.6 % (ref 11.5–15.5)
WBC: 6.3 10*3/uL (ref 4.0–10.5)

## 2015-11-28 LAB — LIPID PANEL
CHOL/HDL RATIO: 4
CHOLESTEROL: 179 mg/dL (ref 0–200)
HDL: 40.2 mg/dL (ref >39.00)
LDL CALC: 109 mg/dL — AB (ref 0–99)
NONHDL: 139.02
Triglycerides: 150 mg/dL — ABNORMAL HIGH (ref 0.0–149.0)
VLDL: 30 mg/dL (ref 0.0–40.0)

## 2015-11-28 LAB — BASIC METABOLIC PANEL
BUN: 32 mg/dL — ABNORMAL HIGH (ref 6–23)
CALCIUM: 9.1 mg/dL (ref 8.4–10.5)
CO2: 31 mEq/L (ref 19–32)
Chloride: 103 mEq/L (ref 96–112)
Creatinine, Ser: 1.02 mg/dL (ref 0.40–1.50)
GFR: 76.34 mL/min (ref >60.00)
GLUCOSE: 90 mg/dL (ref 70–99)
Potassium: 3.9 mEq/L (ref 3.5–5.1)
SODIUM: 140 meq/L (ref 135–145)

## 2015-11-28 LAB — HEPATIC FUNCTION PANEL
ALBUMIN: 4.1 g/dL (ref 3.5–5.2)
ALK PHOS: 48 U/L (ref 39–117)
ALT: 18 U/L (ref 0–53)
AST: 20 U/L (ref 0–37)
BILIRUBIN TOTAL: 0.8 mg/dL (ref 0.2–1.2)
Bilirubin, Direct: 0.1 mg/dL (ref 0.0–0.3)
Total Protein: 6.8 g/dL (ref 6.0–8.3)

## 2015-11-28 LAB — TSH: TSH: 3.83 u[IU]/mL (ref 0.35–4.50)

## 2015-11-28 NOTE — Progress Notes (Signed)
Pre visit review using our clinic review tool, if applicable. No additional management support is needed unless otherwise documented below in the visit note. 

## 2015-11-29 DIAGNOSIS — C61 Malignant neoplasm of prostate: Secondary | ICD-10-CM | POA: Diagnosis not present

## 2015-11-29 NOTE — Progress Notes (Signed)
   Subjective:    Patient ID: Donald Villegas, male    DOB: April 23, 1946, 70 y.o.   MRN: [id number]  HPI 70 yr old male for a cpx. He feels well and has no complaints. He sees Dr. Ardath Sax at [facility] tomorrow to follow up a hx of prostate cancer. His PSA on 07-09-15 was 0.27 but it has gone up to 0.58 as of 11-26-15. It sounds like they are content to watch this for now. He has his Coumadin level checked regularly. No chest pain or SOB. His asthma is stable.   Review of Systems  Constitutional: Negative.   HENT: Negative.   Eyes: Negative.   Respiratory: Negative.   Cardiovascular: Negative.   Gastrointestinal: Negative.   Genitourinary: Negative.   Musculoskeletal: Negative.   Skin: Negative.   Neurological: Negative.   Psychiatric/Behavioral: Negative.        Objective:   Physical Exam  Constitutional: He is oriented to person, place, and time. He appears well-developed and well-nourished. No distress.  HENT:  Head: Normocephalic and atraumatic.  Right Ear: External ear normal.  Left Ear: External ear normal.  Nose: Nose normal.  Mouth/Throat: Oropharynx is clear and moist. No oropharyngeal exudate.  Eyes: Conjunctivae and EOM are normal. Pupils are equal, round, and reactive to light. Right eye exhibits no discharge. Left eye exhibits no discharge. No scleral icterus.  Neck: Neck supple. No JVD present. No tracheal deviation present. No thyromegaly present.  Cardiovascular: Normal rate, regular rhythm, normal heart sounds and intact distal pulses.  Exam reveals no gallop and no friction rub.   No murmur heard. Pulmonary/Chest: Effort normal and breath sounds normal. No respiratory distress. He has no wheezes. He has no rales. He exhibits no tenderness.  Abdominal: Soft. Bowel sounds are normal. He exhibits no distension and no mass. There is no tenderness. There is no rebound and no guarding.  Genitourinary: Rectum normal, prostate normal and penis normal. Guaiac negative stool.  No penile tenderness.  Musculoskeletal: Normal range of motion. He exhibits no edema or tenderness.  Lymphadenopathy:    He has no cervical adenopathy.  Neurological: He is alert and oriented to person, place, and time. He has normal reflexes. No cranial nerve deficit. He exhibits normal muscle tone. Coordination normal.  Skin: Skin is warm and dry. No rash noted. He is not diaphoretic. No erythema. No pallor.  Psychiatric: He has a normal mood and affect. His behavior is normal. Judgment and thought content normal.          Assessment & Plan:  He is doing well. His asthma is stable. He eats a healthy diet and walks for exercise. He sees the Coumadin clinic as scheduled. He will see Urology as above.

## 2015-12-05 DIAGNOSIS — H353131 Nonexudative age-related macular degeneration, bilateral, early dry stage: Secondary | ICD-10-CM | POA: Diagnosis not present

## 2015-12-05 DIAGNOSIS — H01001 Unspecified blepharitis right upper eyelid: Secondary | ICD-10-CM | POA: Diagnosis not present

## 2015-12-05 DIAGNOSIS — H01004 Unspecified blepharitis left upper eyelid: Secondary | ICD-10-CM | POA: Diagnosis not present

## 2015-12-05 DIAGNOSIS — H40053 Ocular hypertension, bilateral: Secondary | ICD-10-CM | POA: Diagnosis not present

## 2015-12-09 ENCOUNTER — Ambulatory Visit: Payer: Medicare Other

## 2016-01-09 DIAGNOSIS — L821 Other seborrheic keratosis: Secondary | ICD-10-CM | POA: Diagnosis not present

## 2016-01-09 DIAGNOSIS — Z85828 Personal history of other malignant neoplasm of skin: Secondary | ICD-10-CM | POA: Diagnosis not present

## 2016-01-09 DIAGNOSIS — L570 Actinic keratosis: Secondary | ICD-10-CM | POA: Diagnosis not present

## 2016-01-09 DIAGNOSIS — Z85820 Personal history of malignant melanoma of skin: Secondary | ICD-10-CM | POA: Diagnosis not present

## 2016-01-15 ENCOUNTER — Other Ambulatory Visit: Payer: Self-pay | Admitting: Family Medicine

## 2016-01-15 ENCOUNTER — Other Ambulatory Visit: Payer: Self-pay | Admitting: General Practice

## 2016-01-15 MED ORDER — WARFARIN SODIUM 5 MG PO TABS: ORAL_TABLET | ORAL | Status: DC

## 2016-02-06 ENCOUNTER — Other Ambulatory Visit: Payer: Self-pay | Admitting: General Practice

## 2016-02-06 ENCOUNTER — Other Ambulatory Visit: Payer: Self-pay | Admitting: Family Medicine

## 2016-02-06 MED ORDER — WARFARIN SODIUM 5 MG PO TABS: ORAL_TABLET | ORAL | Status: DC

## 2016-05-12 ENCOUNTER — Encounter: Payer: Self-pay | Admitting: Internal Medicine

## 2016-05-12 ENCOUNTER — Telehealth: Payer: Self-pay

## 2016-05-12 ENCOUNTER — Ambulatory Visit (INDEPENDENT_AMBULATORY_CARE_PROVIDER_SITE_OTHER): Payer: Medicare Other | Admitting: Internal Medicine

## 2016-05-12 VITALS — BP 128/74 | HR 68 | Ht 69.0 in | Wt 183.4 lb

## 2016-05-12 DIAGNOSIS — R490 Dysphonia: Secondary | ICD-10-CM

## 2016-05-12 DIAGNOSIS — J069 Acute upper respiratory infection, unspecified: Secondary | ICD-10-CM

## 2016-05-12 DIAGNOSIS — K219 Gastro-esophageal reflux disease without esophagitis: Secondary | ICD-10-CM | POA: Diagnosis not present

## 2016-05-12 NOTE — Progress Notes (Signed)
HISTORY OF PRESENT ILLNESS:  Donald Villegas is a 70 y.o. male with past medical history as listed below who has been seen in this office for surveillance colonoscopy and GERD. 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 Contact me last night wanting to be evaluated for difficulties with swallowing. He tells me that he was in his usual state of good health until approximately 8 weeks ago when he developed a severe upper respiratory illness which lasted about 4 weeks. Thereafter he describes intermittent problems with raspiness or gurgle in the pharyngeal region. As well. Mucous which is difficult to swallow. He does not have esophageal dysphagia or oropharyngeal dysphagia. He denies active GERD symptoms. He is on famotidine. He has had no weight loss. No persistent hoarseness. No ear pain. No throat pain. He is eating fine. No issues with liquids. No new medications.  REVIEW OF SYSTEMS:  All non-GI ROS negative except for voice change  Past Medical History:  Diagnosis Date  . Asthma   . Cancer ([place])   . Clotting disorder ([place])   . GERD (gastroesophageal reflux disease)   . Glaucoma    sees Dr. Fredda Hammed  . History of radiation therapy 12/14/13- 01/30/14   prostate bed 6600 cGy in 33 sessions  . Pneumonia 2012  . 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 Ardath Sax at [facility]   . Pulmonary embolism ([place]) 2001, 2011  . [place] spotted fever 2006  . Skin cancer    melanoma in situ, left arm, s/p local wide excision    Past Surgical History:  Procedure Laterality Date  . COLONOSCOPY  07-10-15   per Dr. Henrene Pastor, benign polyp, repeat in 5 yrs   . INGUINAL HERNIA REPAIR Right 2012  . KNEE ARTHROSCOPY Left 2010  . MELANOMA EXCISION Left 2000  . PROSTATE BIOPSY  01/28/2009   gleason 3+3=6  . PROSTATECTOMY  11/08/09   gleason 6    Social History Donald Villegas  reports that he quit smoking about 47 years ago. He has never used smokeless tobacco. He reports that he drinks  about 4.2 oz of alcohol per week . He reports that he does not use drugs.  family history includes Colon cancer (age of onset: 8) in his paternal grandmother; Prostate cancer (age of onset: 17) in his paternal grandfather.  No Known Allergies     PHYSICAL EXAMINATION: Vital signs: BP 128/74 (BP Location: Left Arm, Patient Position: Sitting, Cuff Size: Normal)   Pulse 68   Ht 5\' 9"  (1.753 m) Comment: height measured without shoes  Wt 183 lb 6 oz (83.2 kg)   BMI 27.08 kg/m   Constitutional: Ruddy complexion but generally well-appearing, no acute distress Psychiatric: alert and oriented x3, cooperative Eyes: extraocular movements intact, anicteric, conjunctiva pink Mouth: oral pharynx moist, no lesions. Posterior pharynx was unremarkable. Neck: supple without thyromegaly Lymph: no lymphadenopathy Cardiovascular: heart regular rate and rhythm, no murmur Lungs: clear to auscultation bilaterally Abdomen: soft, nontender, nondistended, no obvious ascites, no peritoneal signs, normal bowel sounds, no organomegaly Rectal: Omitted Extremities: no clubbing cyanosis or lower extremity edema bilaterally Skin: no lesions on visible extremities Neuro: No focal deficits. Cranial nerves intact  ASSESSMENT:  #1. Problems with raspiness voice intermittently and thick mucus in the pharynx post significant upper respiratory illness. 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 No alarm features. Does not appear to be primary GI though GERD equivalent may present similarly. #2. GERD. On famotidine   PLAN:  #1.  Change from famotidine to Prilosec 20 mg daily #2. Initiate Mucinex twice daily to thin phlegm #3. Increase water consumption #4. Assuming no significant deterioration or new symptoms, given another 4-6 weeks. If problems or persistent despite the above measures, ENT referral.  25 minutes spent face-to-face with the patient. Greater than 50% a time use for counseling regarding his chief  complaints

## 2016-05-12 NOTE — Patient Instructions (Signed)
Purchase over the counter Mucinex and take one twice daily.  Take Prilosec over the counter daily.  Increase your water intake.

## 2016-05-12 NOTE — Telephone Encounter (Signed)
Spoke with patient and told him that Dr. Henrene Pastor would be able to see him today at 3:00.  Patient agreed

## 2016-05-29 DIAGNOSIS — C61 Malignant neoplasm of prostate: Secondary | ICD-10-CM | POA: Diagnosis not present

## 2016-06-03 DIAGNOSIS — C61 Malignant neoplasm of prostate: Secondary | ICD-10-CM | POA: Diagnosis not present

## 2016-06-16 ENCOUNTER — Other Ambulatory Visit: Payer: Self-pay

## 2016-06-29 DIAGNOSIS — L309 Dermatitis, unspecified: Secondary | ICD-10-CM | POA: Diagnosis not present

## 2016-06-29 DIAGNOSIS — L570 Actinic keratosis: Secondary | ICD-10-CM | POA: Diagnosis not present

## 2016-06-29 DIAGNOSIS — L821 Other seborrheic keratosis: Secondary | ICD-10-CM | POA: Diagnosis not present

## 2016-06-29 DIAGNOSIS — L814 Other melanin hyperpigmentation: Secondary | ICD-10-CM | POA: Diagnosis not present

## 2016-06-29 DIAGNOSIS — Z85828 Personal history of other malignant neoplasm of skin: Secondary | ICD-10-CM | POA: Diagnosis not present

## 2016-06-29 DIAGNOSIS — Z85820 Personal history of malignant melanoma of skin: Secondary | ICD-10-CM | POA: Diagnosis not present

## 2016-08-13 ENCOUNTER — Other Ambulatory Visit: Payer: Self-pay | Admitting: Family Medicine

## 2016-08-13 NOTE — Telephone Encounter (Signed)
Please Advise

## 2016-09-14 ENCOUNTER — Ambulatory Visit: Payer: Self-pay | Admitting: General Practice

## 2016-09-14 DIAGNOSIS — Z5181 Encounter for therapeutic drug level monitoring: Secondary | ICD-10-CM

## 2016-09-16 DIAGNOSIS — Z23 Encounter for immunization: Secondary | ICD-10-CM | POA: Diagnosis not present

## 2016-11-25 ENCOUNTER — Other Ambulatory Visit (INDEPENDENT_AMBULATORY_CARE_PROVIDER_SITE_OTHER): Payer: PPO

## 2016-11-25 DIAGNOSIS — Z0000 Encounter for general adult medical examination without abnormal findings: Secondary | ICD-10-CM | POA: Diagnosis not present

## 2016-11-25 LAB — POC URINALSYSI DIPSTICK (AUTOMATED)
BILIRUBIN UA: NEGATIVE
Blood, UA: NEGATIVE
Glucose, UA: NEGATIVE
KETONES UA: NEGATIVE
LEUKOCYTES UA: NEGATIVE
Nitrite, UA: NEGATIVE
PH UA: 7.5
PROTEIN UA: NEGATIVE
Spec Grav, UA: 1.02
Urobilinogen, UA: 0.2

## 2016-11-25 LAB — LIPID PANEL
CHOL/HDL RATIO: 5
Cholesterol: 170 mg/dL (ref 0–200)
HDL: 37.2 mg/dL — AB (ref >39.00)
LDL Cholesterol: 102 mg/dL — ABNORMAL HIGH (ref 0–99)
NONHDL: 132.86
Triglycerides: 152 mg/dL — ABNORMAL HIGH (ref 0.0–149.0)
VLDL: 30.4 mg/dL (ref 0.0–40.0)

## 2016-11-25 LAB — HEPATIC FUNCTION PANEL
ALBUMIN: 4.1 g/dL (ref 3.5–5.2)
ALT: 17 U/L (ref 0–53)
AST: 17 U/L (ref 0–37)
Alkaline Phosphatase: 50 U/L (ref 39–117)
BILIRUBIN DIRECT: 0.1 mg/dL (ref 0.0–0.3)
TOTAL PROTEIN: 6.6 g/dL (ref 6.0–8.3)
Total Bilirubin: 0.5 mg/dL (ref 0.2–1.2)

## 2016-11-25 LAB — TSH: TSH: 4.06 u[IU]/mL (ref 0.35–4.50)

## 2016-11-25 LAB — CBC WITH DIFFERENTIAL/PLATELET
BASOS PCT: 1 % (ref 0.0–3.0)
Basophils Absolute: 0 10*3/uL (ref 0.0–0.1)
EOS ABS: 0.2 10*3/uL (ref 0.0–0.7)
EOS PCT: 3.5 % (ref 0.0–5.0)
HEMATOCRIT: 47 % (ref 39.0–52.0)
HEMOGLOBIN: 15.9 g/dL (ref 13.0–17.0)
LYMPHS PCT: 23.8 % (ref 12.0–46.0)
Lymphs Abs: 1.1 10*3/uL (ref 0.7–4.0)
MCHC: 33.8 g/dL (ref 30.0–36.0)
MCV: 97.2 fl (ref 78.0–100.0)
MONOS PCT: 10.6 % (ref 3.0–12.0)
Monocytes Absolute: 0.5 10*3/uL (ref 0.1–1.0)
NEUTROS ABS: 2.8 10*3/uL (ref 1.4–7.7)
Neutrophils Relative %: 61.1 % (ref 43.0–77.0)
PLATELETS: 207 10*3/uL (ref 150.0–400.0)
RBC: 4.83 Mil/uL (ref 4.22–5.81)
RDW: 13.3 % (ref 11.5–15.5)
WBC: 4.6 10*3/uL (ref 4.0–10.5)

## 2016-11-25 LAB — BASIC METABOLIC PANEL
BUN: 24 mg/dL — ABNORMAL HIGH (ref 6–23)
CHLORIDE: 103 meq/L (ref 96–112)
CO2: 29 meq/L (ref 19–32)
CREATININE: 0.93 mg/dL (ref 0.40–1.50)
Calcium: 9.1 mg/dL (ref 8.4–10.5)
GFR: 84.69 mL/min (ref >60.00)
Glucose, Bld: 100 mg/dL — ABNORMAL HIGH (ref 70–99)
POTASSIUM: 4 meq/L (ref 3.5–5.1)
SODIUM: 138 meq/L (ref 135–145)

## 2016-11-25 LAB — PSA: PSA: 1.72 ng/mL (ref 0.10–4.00)

## 2016-12-01 ENCOUNTER — Telehealth: Payer: Self-pay

## 2016-12-01 NOTE — Telephone Encounter (Signed)
Call to Mr. Wiess to inquire if he can come in tomorrow at 8:15 or 8:30 prior to seeing Dr. Sarajane Jews to complete his AWV. Awaiting a call back

## 2016-12-02 ENCOUNTER — Encounter: Payer: Self-pay | Admitting: Family Medicine

## 2016-12-02 ENCOUNTER — Ambulatory Visit (INDEPENDENT_AMBULATORY_CARE_PROVIDER_SITE_OTHER): Payer: PPO | Admitting: Family Medicine

## 2016-12-02 VITALS — BP 148/82 | HR 57 | Temp 98.3°F | Ht 69.0 in | Wt 184.0 lb

## 2016-12-02 DIAGNOSIS — G629 Polyneuropathy, unspecified: Secondary | ICD-10-CM

## 2016-12-02 DIAGNOSIS — Z0000 Encounter for general adult medical examination without abnormal findings: Secondary | ICD-10-CM | POA: Diagnosis not present

## 2016-12-02 MED ORDER — WARFARIN SODIUM 5 MG PO TABS: ORAL_TABLET | ORAL | 3 refills | Status: DC

## 2016-12-02 NOTE — Progress Notes (Signed)
   Subjective:    Patient ID: Donald Villegas, male    DOB: 10/31/1945, 71 y.o.   MRN: [id number]  HPI 71 yr old male for a well exam. He feels well but does mention some numbness on the lateral sides of both feet. This started about a year ago. Sometimes this affects is balance a little but he has not fallen. No other numbness or tingling in the feet or the hands. As we go over his labs we see the PSA has jumped up to 1.72. He actually has his 6 month follow up visit with his urologist, Dr. Lyndel Pleasure, scheduled for next week.    Review of Systems  Constitutional: Negative.   HENT: Negative.   Eyes: Negative.   Respiratory: Negative.   Cardiovascular: Negative.   Gastrointestinal: Negative.   Genitourinary: Negative.   Musculoskeletal: Negative.   Skin: Negative.   Neurological: Positive for numbness. Negative for dizziness, tremors, seizures, syncope, facial asymmetry, speech difficulty, weakness, light-headedness and headaches.  Psychiatric/Behavioral: Negative.        Objective:   Physical Exam  Constitutional: He is oriented to person, place, and time. He appears well-developed and well-nourished. No distress.  HENT:  Head: Normocephalic and atraumatic.  Right Ear: External ear normal.  Left Ear: External ear normal.  Nose: Nose normal.  Mouth/Throat: Oropharynx is clear and moist. No oropharyngeal exudate.  Eyes: Conjunctivae and EOM are normal. Pupils are equal, round, and reactive to light. Right eye exhibits no discharge. Left eye exhibits no discharge. No scleral icterus.  Neck: Neck supple. No JVD present. No tracheal deviation present. No thyromegaly present.  Cardiovascular: Normal rate, regular rhythm, normal heart sounds and intact distal pulses.  Exam reveals no gallop and no friction rub.   No murmur heard. Pulmonary/Chest: Effort normal and breath sounds normal. No respiratory distress. He has no wheezes. He has no rales. He exhibits no tenderness.  Abdominal: Soft.  Bowel sounds are normal. He exhibits no distension. There is no tenderness. There is no rebound and no guarding.  He has 2 small non-tender reducible ventral hernias just superior to the umbilicus. He also has a small non-tender reducible left direct inguinal hernia  Genitourinary: Rectum normal, prostate normal and penis normal. Rectal exam shows guaiac negative stool. No penile tenderness.  Musculoskeletal: Normal range of motion. He exhibits no edema or tenderness.  Lymphadenopathy:    He has no cervical adenopathy.  Neurological: He is alert and oriented to person, place, and time. He has normal reflexes. No cranial nerve deficit. He exhibits normal muscle tone. Coordination normal.  Skin: Skin is warm and dry. No rash noted. He is not diaphoretic. No erythema. No pallor.  Psychiatric: He has a normal mood and affect. His behavior is normal. Judgment and thought content normal.          Assessment & Plan:  Well exam. We discussed diet and exercise. We will observe the 3 hernias and he will recheck if they get larger or become painful. He will see Dr. Lyndel Pleasure next week about the rising PSA. He has a bilateral peroneal nerve neuropathy. We will get a B12 level today to investigate.  Alysia Penna, MD

## 2016-12-02 NOTE — Patient Instructions (Addendum)
Donald Villegas , Thank you for taking time to come for your Medicare Wellness Visit. I appreciate your ongoing commitment to your health goals. Please review the following plan we discussed and let me know if I can assist you in the future.   Medicare now request all "baby boomers" test for possible exposure to Hepatitis C. Many may have been exposed due to dental work, tatoo's, vaccinations when young. The Hepatitis C virus is dormant for many years and then sometimes will cause liver cancer. If you gave blood in the past 15 years, you were most likely checked for Hep C. If you rec'd blood; you may want to consider testing or if you are high risk for any other reason.    Educated to check with insurance regarding coverage of Shingles vaccination on Part D or Part B and may have lower co-pay if provided on the Part D side To note the shingles vaccine released this year 2018 is not a live vaccine and is a series of 2 vaccinations      These are the goals we discussed: Goals    . Exercise 150 minutes per week (moderate activity)          [facility] and [facility]  The benefits: guarantee staying consistently active The result if you don't is staying sedentary   Need to develop plan for winter that may be helpful   [place] an option/ [place]; [place] road        This is a list of the screening recommended for you and due dates:  Health Maintenance  Topic Date Due  .  Hepatitis C: One time screening is recommended by Center for Disease Control  (CDC) for  adults born from [birth year] through 1967.   [birth year]  . Shingles Vaccine  02/02/2004  . Flu Shot  05/19/2016  . Colon Cancer Screening  07/09/2020  . Tetanus Vaccine  08/19/2021  . Pneumonia vaccines  Completed   Health Maintenance, Male A healthy lifestyle and preventative care can promote health and wellness.  Maintain regular health, dental, and eye exams.  Eat a healthy diet. Foods like vegetables,  fruits, whole grains, low-fat dairy products, and lean protein foods contain the nutrients you need and are low in calories. Decrease your intake of foods high in solid fats, added sugars, and salt. Get information about a proper diet from your health care provider, if necessary.  Regular physical exercise is one of the most important things you can do for your health. Most adults should get at least 150 minutes of moderate-intensity exercise (any activity that increases your heart rate and causes you to sweat) each week. In addition, most adults need muscle-strengthening exercises on 2 or more days a week.   Maintain a healthy weight. The body mass index (BMI) is a screening tool to identify possible weight problems. It provides an estimate of body fat based on height and weight. Your health care provider can find your BMI and can help you achieve or maintain a healthy weight. For males 20 years and older:  A BMI below 18.5 is considered underweight.  A BMI of 18.5 to 24.9 is normal.  A BMI of 25 to 29.9 is considered overweight.  A BMI of 30 and above is considered obese.  Maintain normal blood lipids and cholesterol by exercising and minimizing your intake of saturated fat. Eat a balanced diet with plenty of fruits and vegetables. Blood tests for lipids and  cholesterol should begin at age 37 and be repeated every 5 years. If your lipid or cholesterol levels are high, you are over age 71, or you are at high risk for heart disease, you may need your cholesterol levels checked more frequently.Ongoing high lipid and cholesterol levels should be treated with medicines if diet and exercise are not working.  If you smoke, find out from your health care provider how to quit. If you do not use tobacco, do not start.  Lung cancer screening is recommended for adults aged 31-80 years who are at high risk for developing lung cancer because of a history of smoking. A yearly low-dose CT scan of the lungs is  recommended for people who have at least a 30-pack-year history of smoking and are current smokers or have quit within the past 15 years. A pack year of smoking is smoking an average of 1 pack of cigarettes a day for 1 year (for example, a 30-pack-year history of smoking could mean smoking 1 pack a day for 30 years or 2 packs a day for 15 years). Yearly screening should continue until the smoker has stopped smoking for at least 15 years. Yearly screening should be stopped for people who develop a health problem that would prevent them from having lung cancer treatment.  If you choose to drink alcohol, do not have more than 2 drinks per day. One drink is considered to be 12 oz (360 mL) of beer, 5 oz (150 mL) of wine, or 1.5 oz (45 mL) of liquor.  Avoid the use of street drugs. Do not share needles with anyone. Ask for help if you need support or instructions about stopping the use of drugs.  High blood pressure causes heart disease and increases the risk of stroke. High blood pressure is more likely to develop in:  People who have blood pressure in the end of the normal range (100-139/85-89 mm Hg).  People who are overweight or obese.  People who are African American.  If you are 41-10 years of age, have your blood pressure checked every 3-5 years. If you are 39 years of age or older, have your blood pressure checked every year. You should have your blood pressure measured twice-once when you are at a hospital or clinic, and once when you are not at a hospital or clinic. Record the average of the two measurements. To check your blood pressure when you are not at a hospital or clinic, you can use:  An automated blood pressure machine at a pharmacy.  A home blood pressure monitor.  If you are 37-82 years old, ask your health care provider if you should take aspirin to prevent heart disease.  Diabetes screening involves taking a blood sample to check your fasting blood sugar level. This should be  done once every 3 years after age 75 if you are at a normal weight and without risk factors for diabetes. Testing should be considered at a younger age or be carried out more frequently if you are overweight and have at least 1 risk factor for diabetes.  Colorectal cancer can be detected and often prevented. Most routine colorectal cancer screening begins at the age of 41 and continues through age 73. However, your health care provider may recommend screening at an earlier age if you have risk factors for colon cancer. On a yearly basis, your health care provider may provide home test kits to check for hidden blood in the stool. A small camera at the  end of a tube may be used to directly examine the colon (sigmoidoscopy or colonoscopy) to detect the earliest forms of colorectal cancer. Talk to your health care provider about this at age 42 when routine screening begins. A direct exam of the colon should be repeated every 5-10 years through age 42, unless early forms of precancerous polyps or small growths are found.  People who are at an increased risk for hepatitis B should be screened for this virus. You are considered at high risk for hepatitis B if:  You were born in a country where hepatitis B occurs often. Talk with your health care provider about which countries are considered high risk.  Your parents were born in a high-risk country and you have not received a shot to protect against hepatitis B (hepatitis B vaccine).  You have HIV or AIDS.  You use needles to inject street drugs.  You live with, or have sex with, someone who has hepatitis B.  You are a man who has sex with other men (MSM).  You get hemodialysis treatment.  You take certain medicines for conditions like cancer, organ transplantation, and autoimmune conditions.  Hepatitis C blood testing is recommended for all people born from [birth year] through 1965 and any individual with known risk factors for hepatitis C.  Healthy men  should no longer receive prostate-specific antigen (PSA) blood tests as part of routine cancer screening. Talk to your health care provider about prostate cancer screening.  Testicular cancer screening is not recommended for adolescents or adult males who have no symptoms. Screening includes self-exam, a health care provider exam, and other screening tests. Consult with your health care provider about any symptoms you have or any concerns you have about testicular cancer.  Practice safe sex. Use condoms and avoid high-risk sexual practices to reduce the spread of sexually transmitted infections (STIs).  You should be screened for STIs, including gonorrhea and chlamydia if:  You are sexually active and are younger than 24 years.  You are older than 24 years, and your health care provider tells you that you are at risk for this type of infection.  Your sexual activity has changed since you were last screened, and you are at an increased risk for chlamydia or gonorrhea. Ask your health care provider if you are at risk.  If you are at risk of being infected with HIV, it is recommended that you take a prescription medicine daily to prevent HIV infection. This is called pre-exposure prophylaxis (PrEP). You are considered at risk if:  You are a man who has sex with other men (MSM).  You are a heterosexual man who is sexually active with multiple partners.  You take drugs by injection.  You are sexually active with a partner who has HIV.  Talk with your health care provider about whether you are at high risk of being infected with HIV. If you choose to begin PrEP, you should first be tested for HIV. You should then be tested every 3 months for as long as you are taking PrEP.  Use sunscreen. Apply sunscreen liberally and repeatedly throughout the day. You should seek shade when your shadow is shorter than you. Protect yourself by wearing long sleeves, pants, a wide-brimmed hat, and sunglasses year  round whenever you are outdoors.  Tell your health care provider of new moles or changes in moles, especially if there is a change in shape or color. Also, tell your health care provider if a mole is larger  than the size of a pencil eraser.  A one-time screening for abdominal aortic aneurysm (AAA) and surgical repair of large AAAs by ultrasound is recommended for men aged 19-75 years who are current or former smokers.  Stay current with your vaccines (immunizations). This information is not intended to replace advice given to you by your health care provider. Make sure you discuss any questions you have with your health care provider. Document Released: 04/02/2008 Document Revised: 10/26/2014 Document Reviewed: 07/09/2015 Elsevier Interactive Patient Education  2017 [place] Prevention in the Home Falls can cause injuries and can affect people from all age groups. There are many simple things that you can do to make your home safe and to help prevent falls. What can I do on the outside of my home?  Regularly repair the edges of walkways and driveways and fix any cracks.  Remove high doorway thresholds.  Trim any shrubbery on the main path into your home.  Use bright outdoor lighting.  Clear walkways of debris and clutter, including tools and rocks.  Regularly check that handrails are securely fastened and in good repair. Both sides of any steps should have handrails.  Install guardrails along the edges of any raised decks or porches.  Have leaves, snow, and ice cleared regularly.  Use sand or salt on walkways during winter months.  In the garage, clean up any spills right away, including grease or oil spills. What can I do in the bathroom?  Use night lights.  Install grab bars by the toilet and in the tub and shower. Do not use towel bars as grab bars.  Use non-skid mats or decals on the floor of the tub or shower.  If you need to sit down while you are in  the shower, use a plastic, non-slip stool.  Keep the floor dry. Immediately clean up any water that spills on the floor.  Remove soap buildup in the tub or shower on a regular basis.  Attach bath mats securely with double-sided non-slip rug tape.  Remove throw rugs and other tripping hazards from the floor. What can I do in the bedroom?  Use night lights.  Make sure that a bedside light is easy to reach.  Do not use oversized bedding that drapes onto the floor.  Have a firm chair that has side arms to use for getting dressed.  Remove throw rugs and other tripping hazards from the floor. What can I do in the kitchen?  Clean up any spills right away.  Avoid walking on wet floors.  Place frequently used items in easy-to-reach places.  If you need to reach for something above you, use a sturdy step stool that has a grab bar.  Keep electrical cables out of the way.  Do not use floor polish or wax that makes floors slippery. If you have to use wax, make sure that it is non-skid floor wax.  Remove throw rugs and other tripping hazards from the floor. What can I do in the stairways?  Do not leave any items on the stairs.  Make sure that there are handrails on both sides of the stairs. Fix handrails that are broken or loose. Make sure that handrails are as long as the stairways.  Check any carpeting to make sure that it is firmly attached to the stairs. Fix any carpet that is loose or worn.  Avoid having throw rugs at the top or bottom of stairways, or secure the rugs with  carpet tape to prevent them from moving.  Make sure that you have a light switch at the top of the stairs and the bottom of the stairs. If you do not have them, have them installed. What are some other fall prevention tips?  Wear closed-toe shoes that fit well and support your feet. Wear shoes that have rubber soles or low heels.  When you use a stepladder, make sure that it is completely opened and that  the sides are firmly locked. Have someone hold the ladder while you are using it. Do not climb a closed stepladder.  Add color or contrast paint or tape to grab bars and handrails in your home. Place contrasting color strips on the first and last steps.  Use mobility aids as needed, such as canes, walkers, scooters, and crutches.  Turn on lights if it is dark. Replace any light bulbs that burn out.  Set up furniture so that there are clear paths. Keep the furniture in the same spot.  Fix any uneven floor surfaces.  Choose a carpet design that does not hide the edge of steps of a stairway.  Be aware of any and all pets.  Review your medicines with your healthcare provider. Some medicines can cause dizziness or changes in blood pressure, which increase your risk of falling. Talk with your health care provider about other ways that you can decrease your risk of falls. This may include working with a physical therapist or trainer to improve your strength, balance, and endurance. This information is not intended to replace advice given to you by your health care provider. Make sure you discuss any questions you have with your health care provider. Document Released: 09/25/2002 Document Revised: 03/03/2016 Document Reviewed: 11/09/2014 Elsevier Interactive Patient Education  2017 [employer].

## 2016-12-02 NOTE — Progress Notes (Addendum)
Subjective:   Donald Villegas is a 71 y.o. male who presents for Medicare Annual/Subsequent preventive examination.  HRA assessment completed during this visit with Mr. Konig   Married x 43 years Have 2 children;  No grandchildren  The Patient was informed that the wellness visit is to identify future health risk and educate and initiate measures that can reduce risk for increased disease through the lifespan.    NO ROS; Medicare Wellness Visit Last OV:  Today with Dr. Sarajane Jews Labs completed: yes and reviewed;  HDL low but did discuss as he has had overall improvement in the last 5 years   Describes health as fair, good or great? Really great Social History   Social History  . Marital status: Married    Spouse name: N/A  . Number of children: N/A  . Years of education: N/A   Occupational History  . Not on file.   Social History Main Topics  . Smoking status: Former Smoker    Quit date: 11/27/1968  . Smokeless tobacco: Never Used  . Alcohol use 4.2 oz/week    7 Shots of liquor per week     Comment: scotch usually every day  . Drug use: No  . Sexual activity: Not on file   Other Topics Concern  . Not on file   Social History Narrative  . No narrative on file   Update:  Tobacco: former smoker; quit 1970 When he goes fishing, may smoke one "cigar" / use is rare ETOH; sometimes one drink in the evening   Medications checked by Dr. Sarajane Jews   BMI: 27.2 Trying to lose 175   Diet; Does not eat processed foods, does not eat white foods Does not snack; most of the food he consumes in the am 1/2 portions for dinner Rare fried food  Still would like to lose a few pounds    Exercise; doesn't play tennis any more due to neuropathy as well as knee past knee surgery / also states his group had various health issues  Cold weather has stopped walking which he did walk 2  [place] a day Now walks at least 1 miles in general during the day    Would like to get more active Did ride  bikes in the past but does not feel he wants to stop this.   HOME SAFETY;  Fall hx; no  Gait:  Normal  Given education on "Fall Prevention in the Home" for more safety tips the patient can apply as appropriate.    Mental Health:  Any emotional problems? Anxious, depressed, irritable, sad or blue? no Denies feeling depressed or hopeless; voices pleasure in daily life How many social activities have you been engaged in within the last 2 weeks? No Still engages with family and friends   Cognitive; no issues with daily living  Manages checkbook, medications; no failures of task Ad8 score reviewed for issues;  Issues making decisions; no  Less interest in hobbies / activities" no  Repeats questions, stories; family complaining: NO  Trouble using ordinary gadgets; microwave; computer: no  Forgets the month or year: no  Mismanaging finances: no  Missing apt: no but does write them down  Daily problems with thinking of memory NO Ad8 score is 0   Any dizziness whe/n standing up?   Deferred hearing and vision due to time but did not verbalize any issues;     Advanced Directive addressed; Completed per the patient report     Colonoscopy; due 06/2020  Prostate cancer screening: completed 11/25/2016    Immunizations Due: (Vaccines reviewed and educated regarding any overdue)  States he had the flu; will put in historical date  Educated regarding the shingles and Hepatitis C To discuss with Dr. Sarajane Jews when he sees him  Individual Goal: documented and discussed;  May join the gym; motivation fair; does not like to walk in the winter         Objective:    Vitals: BP (!) 148/82   Pulse (!) 57   Temp 98.3 F (36.8 C)   Ht 5\' 9"  (1.753 m)   Wt 184 Donald (83.5 kg)   BMI 27.17 kg/m   Body mass index is 27.17 kg/m.  Tobacco History  Smoking Status  . Former Smoker  . Quit date: 11/27/1968  Smokeless Tobacco  . Never Used     Counseling given: Yes   Past Medical  History:  Diagnosis Date  . Asthma   . Cancer ([place])   . Clotting disorder ([place])   . GERD (gastroesophageal reflux disease)   . Glaucoma    sees Dr. Fredda Hammed  . History of radiation therapy 12/14/13- 01/30/14   prostate bed 6600 cGy in 33 sessions  . Pneumonia 2012  . 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 Ardath Sax at [facility]   . Pulmonary embolism ([place]) 2001, 2011  . [place] spotted fever 2006  . Skin cancer    melanoma in situ, left arm, s/p local wide excision   Past Surgical History:  Procedure Laterality Date  . COLONOSCOPY  07-10-15   per Dr. Henrene Pastor, benign polyp, repeat in 5 yrs   . INGUINAL HERNIA REPAIR Right 2012  . KNEE ARTHROSCOPY Left 2010  . MELANOMA EXCISION Left 2000  . PROSTATE BIOPSY  01/28/2009   gleason 3+3=6  . PROSTATECTOMY  11/08/09   gleason 6   Family History  Problem Relation Age of Onset  . Colon cancer Paternal Grandmother 62  . Prostate cancer Paternal Grandfather 27  . Esophageal cancer Neg Hx   . Rectal cancer Neg Hx   . Stomach cancer Neg Hx    History  Sexual Activity  . Sexual activity: Not on file    Outpatient Encounter Prescriptions as of 12/02/2016  Medication Sig  . famotidine (PEPCID) 20 MG tablet Take 20 mg by mouth daily.  . predniSONE (DELTASONE) 20 MG tablet Take 20 mg by mouth as needed.  . sildenafil (VIAGRA) 100 MG tablet Take 0.5-1 tablets (50-100 mg total) by mouth daily as needed for erectile dysfunction.  . tadalafil (CIALIS) 20 MG tablet Take 0.5-1 tablets (10-20 mg total) by mouth every other day as needed for erectile dysfunction.  Marland Kitchen warfarin (COUMADIN) 5 MG tablet TAKE AS DIRECTED BY COUMADIN CLINIC  . [DISCONTINUED] warfarin (COUMADIN) 5 MG tablet TAKE AS DIRECTED BY COUMADIN CLINIC  . timolol (TIMOPTIC-XR) 0.5 % ophthalmic gel-forming Place 1 drop into both eyes every other day.    No facility-administered encounter medications on file as of 12/02/2016.     Activities  of Daily Living No flowsheet data found.  Patient Care Team: Laurey Morale, MD as PCP - General (Family Medicine)   Assessment:     Exercise Activities and Dietary recommendations    Goals    . Exercise 150 minutes per week (moderate activity)          [facility] [employer] and [facility]  The benefits: guarantee staying  consistently active The result if you don't is staying sedentary   Need to develop plan for winter that may be helpful   [place] an option/ [place]; [place]       Fall Risk Fall Risk  12/02/2016 12/02/2016 06/16/2016 10/25/2014  Falls in the past year? No No No No   Depression Screen PHQ 2/9 Scores 12/02/2016 12/02/2016 10/25/2014  PHQ - 2 Score 0 0 0          Immunization History  Administered Date(s) Administered  . Influenza Split 07/28/2011  . Influenza Whole 07/21/2010  . Influenza, High Dose Seasonal PF 08/15/2013  . Influenza-Unspecified 09/28/2014, 09/20/2015, 08/28/2016  . Pneumococcal Conjugate-13 10/25/2014  . Pneumococcal Polysaccharide-23 07/28/2011  . Tdap 08/20/2011   Screening Tests Health Maintenance  Topic Date Due  . Hepatitis C Screening  1946-01-27  . ZOSTAVAX  02/02/2004  . INFLUENZA VACCINE  05/19/2016  . COLONOSCOPY  07/09/2020  . TETANUS/TDAP  08/19/2021  . PNA vac Low Risk Adult  Completed      Plan:     Educated on hepatitis; to discuss with Dr. Evelena Asa regarding shingles (2018 vaccine and he will check with insurance coverage)  Documented flu vaccine   During the course of the visit the patient was educated and counseled about the following appropriate screening and preventive services:   Vaccines to include Pneumoccal, Influenza, Hepatitis B, Td, Zostavax, HCV  Electrocardiogram  Cardiovascular Disease  Colorectal cancer screening  Diabetes screening  Prostate Cancer Screening  Glaucoma screening  Nutrition counseling   Smoking cessation counseling  Patient Instructions (the  written plan) was given to the patient.    [id number], RN  12/02/2016  I have read this note and agree with its contents.  Alysia Penna, MD

## 2016-12-02 NOTE — Progress Notes (Signed)
Pre visit review using our clinic review tool, if applicable. No additional management support is needed unless otherwise documented below in the visit note. 

## 2016-12-11 DIAGNOSIS — C61 Malignant neoplasm of prostate: Secondary | ICD-10-CM | POA: Diagnosis not present

## 2016-12-14 ENCOUNTER — Other Ambulatory Visit (INDEPENDENT_AMBULATORY_CARE_PROVIDER_SITE_OTHER): Payer: PPO

## 2016-12-14 ENCOUNTER — Other Ambulatory Visit: Payer: PPO

## 2016-12-14 ENCOUNTER — Ambulatory Visit (INDEPENDENT_AMBULATORY_CARE_PROVIDER_SITE_OTHER): Payer: PPO

## 2016-12-14 DIAGNOSIS — G629 Polyneuropathy, unspecified: Secondary | ICD-10-CM | POA: Diagnosis not present

## 2016-12-14 DIAGNOSIS — Z5181 Encounter for therapeutic drug level monitoring: Secondary | ICD-10-CM

## 2016-12-14 DIAGNOSIS — I2699 Other pulmonary embolism without acute cor pulmonale: Secondary | ICD-10-CM | POA: Insufficient documentation

## 2016-12-14 LAB — POCT INR: INR: 1.2

## 2016-12-14 LAB — VITAMIN B12: VITAMIN B 12: 319 pg/mL (ref 211–911)

## 2016-12-14 NOTE — Patient Instructions (Signed)
Pre visit review using our clinic review tool, if applicable. No additional management support is needed unless otherwise documented below in the visit note.  INR today 1.2  Patient reports that he ran out of medication and missed at least 2 or more doses.  In addition, he has not been coming in for checks consistently and we discussed this.  He states that this has always been his arrangement to come in every 6 - 8 weeks.  I informed him that he has not been in but twice (January and November) of this last year and he denies having this checked anywhere else.  He states that this has been his arrangement because he has always been so consistent for years.  I will communicate this with his providers but insist today that he get back on a routine schedule for safety.   Today, this writer gives a coumadin boost per protocol of 20-30%.  Patient is to take an extra 1/2 pill (7.5mg ) today 2/26, tomorrow (7.5mg ) 2/27, and Wednesday (5mg ) 2/28, then he is to continue his prior dosing of 1 pill (5mg ) daily EXCEPT for 1/2 pill (2.5mg ) on Sundays and Wednesdays.  Recheck in 1 week.  Patient did pick up his refilled coumadin medication last week as approved by Dr. Sarajane Jews.  He verbalizes understanding of all risks involved with subtherapeutic level and the importance of keeping regular INR checks for which he agrees. Patient will follow up on Monday, 12/21/16 here at [place] with Villa Herb, RN for next INR check.

## 2016-12-14 NOTE — Progress Notes (Signed)
I agree with this plan.

## 2016-12-21 ENCOUNTER — Ambulatory Visit (INDEPENDENT_AMBULATORY_CARE_PROVIDER_SITE_OTHER): Payer: PPO | Admitting: General Practice

## 2016-12-21 ENCOUNTER — Telehealth: Payer: Self-pay | Admitting: Family Medicine

## 2016-12-21 ENCOUNTER — Ambulatory Visit: Payer: PPO

## 2016-12-21 DIAGNOSIS — L821 Other seborrheic keratosis: Secondary | ICD-10-CM | POA: Diagnosis not present

## 2016-12-21 DIAGNOSIS — Z5181 Encounter for therapeutic drug level monitoring: Secondary | ICD-10-CM | POA: Diagnosis not present

## 2016-12-21 DIAGNOSIS — D225 Melanocytic nevi of trunk: Secondary | ICD-10-CM | POA: Diagnosis not present

## 2016-12-21 DIAGNOSIS — Z85828 Personal history of other malignant neoplasm of skin: Secondary | ICD-10-CM | POA: Diagnosis not present

## 2016-12-21 DIAGNOSIS — Z85820 Personal history of malignant melanoma of skin: Secondary | ICD-10-CM | POA: Diagnosis not present

## 2016-12-21 DIAGNOSIS — L814 Other melanin hyperpigmentation: Secondary | ICD-10-CM | POA: Diagnosis not present

## 2016-12-21 DIAGNOSIS — L570 Actinic keratosis: Secondary | ICD-10-CM | POA: Diagnosis not present

## 2016-12-21 LAB — POCT INR: INR: 2.3

## 2016-12-21 NOTE — Telephone Encounter (Signed)
Patient wants a call back, said he hasn't heard about the results of his B12 yet.

## 2016-12-21 NOTE — Telephone Encounter (Signed)
I spoke with pt and gave results.  

## 2016-12-21 NOTE — Patient Instructions (Signed)
Pre visit review using our clinic review tool, if applicable. No additional management support is needed unless otherwise documented below in the visit note. 

## 2016-12-22 ENCOUNTER — Encounter (HOSPITAL_COMMUNITY): Payer: Self-pay

## 2016-12-22 ENCOUNTER — Emergency Department (HOSPITAL_COMMUNITY)
Admission: EM | Admit: 2016-12-22 | Discharge: 2016-12-22 | Disposition: A | Discharge status: Home / Self Care | Payer: PPO | Attending: Emergency Medicine | Admitting: Emergency Medicine

## 2016-12-22 DIAGNOSIS — J45909 Unspecified asthma, uncomplicated: Secondary | ICD-10-CM | POA: Insufficient documentation

## 2016-12-22 DIAGNOSIS — Z87891 Personal history of nicotine dependence: Secondary | ICD-10-CM | POA: Diagnosis not present

## 2016-12-22 DIAGNOSIS — S30860A Insect bite (nonvenomous) of lower back and pelvis, initial encounter: Secondary | ICD-10-CM | POA: Diagnosis not present

## 2016-12-22 DIAGNOSIS — Z8546 Personal history of malignant neoplasm of prostate: Secondary | ICD-10-CM | POA: Insufficient documentation

## 2016-12-22 DIAGNOSIS — Z7901 Long term (current) use of anticoagulants: Secondary | ICD-10-CM | POA: Diagnosis not present

## 2016-12-22 DIAGNOSIS — Z85828 Personal history of other malignant neoplasm of skin: Secondary | ICD-10-CM | POA: Insufficient documentation

## 2016-12-22 DIAGNOSIS — S30860D Insect bite (nonvenomous) of lower back and pelvis, subsequent encounter: Secondary | ICD-10-CM | POA: Insufficient documentation

## 2016-12-22 DIAGNOSIS — W57XXXD Bitten or stung by nonvenomous insect and other nonvenomous arthropods, subsequent encounter: Secondary | ICD-10-CM | POA: Diagnosis not present

## 2016-12-22 DIAGNOSIS — S30850A Superficial foreign body of lower back and pelvis, initial encounter: Secondary | ICD-10-CM | POA: Diagnosis not present

## 2016-12-22 DIAGNOSIS — W57XXXA Bitten or stung by nonvenomous insect and other nonvenomous arthropods, initial encounter: Secondary | ICD-10-CM

## 2016-12-22 NOTE — ED Provider Notes (Signed)
[facility] DEPT Provider Note   CSN: [id number] Arrival date & time: 12/22/16  [id number]     History   Chief Complaint Chief Complaint  Patient presents with  . Tick Removal    HPI Donald Villegas is a 71 y.o. male.  HPI  71 y.o. male, presents to the Emergency Department today for tick removal. Noted being outside over the weekend. Noticed Tick in the shower this morning, but anabel to reach due to it being on posterior buttocks. Notes no N/V. No fevers. No pain. No myalgias. No rash. No other symptoms noted.   Past Medical History:  Diagnosis Date  . Asthma   . Cancer ([place])   . Clotting disorder ([place])   . GERD (gastroesophageal reflux disease)   . Glaucoma    sees Dr. Fredda Hammed  . History of radiation therapy 12/14/13- 01/30/14   prostate bed 6600 cGy in 33 sessions  . Pneumonia 2012  . 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 Ardath Sax at [facility]   . Pulmonary embolism ([place]) 2001, 2011  . [place] spotted fever 2006  . Skin cancer    melanoma in situ, left arm, s/p local wide excision    Patient Active Problem List   Diagnosis Date Noted  . Pulmonary embolism without acute cor pulmonale ([place]) 12/14/2016  . Cough 12/29/2014  . 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 Encounter for therapeutic drug monitoring 12/26/2013  . Prostate cancer ([place])   . Viral URI 09/09/2011  . History of coagulopathy 08/20/2011  . Pulmonary embolism ([place]) 12/08/2010  . CHEST PAIN 07/01/2010  . Other dyspnea and respiratory abnormality 05/01/2010  . GERD 04/25/2010  . PULMONARY EMBOLISM, HX OF 04/25/2010  . PNEUMONIA, HX OF 04/25/2010    Past Surgical History:  Procedure Laterality Date  . COLONOSCOPY  07-10-15   per Dr. Henrene Pastor, benign polyp, repeat in 5 yrs   . INGUINAL HERNIA REPAIR Right 2012  . KNEE ARTHROSCOPY Left 2010  . MELANOMA EXCISION Left 2000  . PROSTATE BIOPSY  01/28/2009   gleason 3+3=6  . PROSTATECTOMY  11/08/09   gleason 6        Home Medications    Prior to Admission medications   Medication Sig Start Date End Date Taking? Authorizing Provider  famotidine (PEPCID) 20 MG tablet Take 20 mg by mouth daily.    Historical Provider, MD  predniSONE (DELTASONE) 20 MG tablet Take 20 mg by mouth as needed.    Historical Provider, MD  sildenafil (VIAGRA) 100 MG tablet Take 0.5-1 tablets (50-100 mg total) by mouth daily as needed for erectile dysfunction. 10/25/14   Dorena Cookey, MD  tadalafil (CIALIS) 20 MG tablet Take 0.5-1 tablets (10-20 mg total) by mouth every other day as needed for erectile dysfunction. 10/25/14   Dorena Cookey, MD  timolol (TIMOPTIC-XR) 0.5 % ophthalmic gel-forming Place 1 drop into both eyes every other day.  07/17/11   Historical Provider, MD  warfarin (COUMADIN) 5 MG tablet TAKE AS DIRECTED BY COUMADIN CLINIC 12/02/16   Laurey Morale, MD    Family History Family History  Problem Relation Age of Onset  . Colon cancer Paternal Grandmother 40  . Prostate cancer Paternal Grandfather 16  . Esophageal cancer Neg Hx   . Rectal cancer Neg Hx   . Stomach cancer Neg Hx     Social History Social History  Substance Use Topics  . Smoking status: Former Smoker    Quit date: 11/27/1968  .  Smokeless tobacco: Never Used  . Alcohol use 4.2 oz/week    7 Shots of liquor per week     Comment: scotch usually every day     Allergies   Patient has no known allergies.   Review of Systems Review of Systems  Constitutional: Negative for fever.  Gastrointestinal: Negative for nausea and vomiting.  Musculoskeletal: Negative for myalgias.  Skin: Positive for wound.   Physical Exam Updated Vital Signs BP 134/83 (BP Location: Left Arm)   Pulse 77   Temp 97.7 F (36.5 C) (Oral)   Resp 16   Ht 5\' 10"  (1.778 m)   Wt 81.6 kg   SpO2 98%   BMI 25.83 kg/m   Physical Exam  Constitutional: He is oriented to person, place, and time. Vital signs are normal. He appears well-developed and well-nourished.   HENT:  Head: Normocephalic.  Right Ear: Hearing normal.  Left Ear: Hearing normal.  Eyes: Conjunctivae and EOM are normal. Pupils are equal, round, and reactive to light.  Cardiovascular: Normal rate and regular rhythm.   Pulmonary/Chest: Effort normal.  Genitourinary:  Genitourinary Comments: Tick noted on posterior right buttocks. Tick intact  Neurological: He is alert and oriented to person, place, and time.  Skin: Skin is warm and dry.  Psychiatric: He has a normal mood and affect. His speech is normal and behavior is normal. Thought content normal.   ED Treatments / Results  Labs (all labs ordered are listed, but only abnormal results are displayed) Labs Reviewed - No data to display  EKG  EKG Interpretation None       Radiology No results found.  Procedures .Foreign Body Removal Date/Time: 12/22/2016 8:53 AM Performed by: Shary Decamp Authorized by: Shary Decamp  Consent: Verbal consent obtained. Consent given by: patient Patient understanding: patient states understanding of the procedure being performed Patient identity confirmed: verbally with patient and arm band Intake: Right Buttocks.  Sedation: Patient sedated: no Patient restrained: no Patient cooperative: yes Complexity: simple 1 objects recovered. Objects recovered: Tick Post-procedure assessment: foreign body removed Patient tolerance: Patient tolerated the procedure well with no immediate complications   (including critical care time)  Medications Ordered in ED Medications - No data to display   Initial Impression / Assessment and Plan / ED Course  I have reviewed the triage vital signs and the nursing notes.  Pertinent labs & imaging results that were available during my care of the patient were reviewed by me and considered in my medical decision making (see chart for details).  Final Clinical Impressions(s) / ED Diagnoses     {I have reviewed the relevant previous healthcare records.  {I  obtained HPI from historian.   ED Course:  Assessment: Pt is a 71 y.o. male who presents for tick removal. Removed in ED without complication. No residual parts remained on body. No surrounding erythema over area. No ulceration. Pt without systemic symptoms. Plan is to DC home with follow up to PCP. At time of discharge, Patient is in no acute distress. Vital Signs are stable. Patient is able to ambulate. Patient able to tolerate PO.   Disposition/Plan:  DC Home Additional Verbal discharge instructions given and discussed with patient.  Pt Instructed to f/u with PCP in the next week for evaluation and treatment of symptoms. Return precautions given Pt acknowledges and agrees with plan  Supervising Physician No att. providers found  Final diagnoses:  Tick bite with subsequent removal of tick    New Prescriptions New Prescriptions  No medications on file     Shary Decamp, PA-C 12/22/16 [id number]    Carmin Muskrat, MD 12/22/16 [id number]

## 2016-12-22 NOTE — ED Notes (Signed)
Pt had small tick on right butt cheek. Fully removed by PA

## 2016-12-22 NOTE — Discharge Instructions (Signed)
Please read and follow all provided instructions.  Your diagnoses today include:  1. Tick bite with subsequent removal of tick     Tests performed today include: Vital signs. See below for your results today.   Medications prescribed:  Take as prescribed   Home care instructions:  Follow any educational materials contained in this packet.  Follow-up instructions: Please follow-up with your primary care provider for further evaluation of symptoms and treatment   Return instructions:  Please return to the Emergency Department if you do not get better, if you get worse, or new symptoms OR  - Fever (temperature greater than 101.78F)  - Bleeding that does not stop with holding pressure to the area    -Severe pain (please note that you may be more sore the day after your accident)  - Chest Pain  - Difficulty breathing  - Severe nausea or vomiting  - Inability to tolerate food and liquids  - Passing out  - Skin becoming red around your wounds  - Change in mental status (confusion or lethargy)  - New numbness or weakness    Please return if you have any other emergent concerns.  Additional Information:  Your vital signs today were: BP 134/83 (BP Location: Left Arm)    Pulse 77    Temp 97.7 F (36.5 C) (Oral)    Resp 16    Ht 5\' 10"  (1.778 m)    Wt 81.6 kg    SpO2 98%    BMI 25.83 kg/m  If your blood pressure (BP) was elevated above 135/85 this visit, please have this repeated by your doctor within one month. ---------------

## 2016-12-22 NOTE — ED Triage Notes (Signed)
Per Pt, Pt is coming from home with a tick attached to his buttocks noted this morning. Pt reports being unable to get it off at this time. Denies any fevers, N/V.

## 2017-02-10 ENCOUNTER — Telehealth: Payer: Self-pay | Admitting: Family Medicine

## 2017-02-10 NOTE — Telephone Encounter (Signed)
done

## 2017-02-15 ENCOUNTER — Ambulatory Visit (INDEPENDENT_AMBULATORY_CARE_PROVIDER_SITE_OTHER): Payer: PPO | Admitting: General Practice

## 2017-02-15 DIAGNOSIS — Z5181 Encounter for therapeutic drug level monitoring: Secondary | ICD-10-CM | POA: Diagnosis not present

## 2017-02-15 LAB — POCT INR: INR: 3.1

## 2017-02-15 NOTE — Patient Instructions (Signed)
Pre visit review using our clinic review tool, if applicable. No additional management support is needed unless otherwise documented below in the visit note. 

## 2017-04-07 ENCOUNTER — Telehealth: Payer: Self-pay | Admitting: Family Medicine

## 2017-04-07 DIAGNOSIS — K439 Ventral hernia without obstruction or gangrene: Secondary | ICD-10-CM

## 2017-04-07 NOTE — Telephone Encounter (Signed)
Pt would like a referral to [place] [facility] for hernia

## 2017-04-07 NOTE — Telephone Encounter (Signed)
I spoke with pt and went over referral information.

## 2017-04-07 NOTE — Telephone Encounter (Signed)
Referral was done  

## 2017-04-12 ENCOUNTER — Ambulatory Visit (INDEPENDENT_AMBULATORY_CARE_PROVIDER_SITE_OTHER): Payer: PPO | Admitting: General Practice

## 2017-04-12 DIAGNOSIS — Z5181 Encounter for therapeutic drug level monitoring: Secondary | ICD-10-CM | POA: Diagnosis not present

## 2017-04-12 LAB — POCT INR: INR: 2.2

## 2017-04-12 NOTE — Patient Instructions (Signed)
Pre visit review using our clinic review tool, if applicable. No additional management support is needed unless otherwise documented below in the visit note. 

## 2017-04-23 DIAGNOSIS — H1033 Unspecified acute conjunctivitis, bilateral: Secondary | ICD-10-CM | POA: Diagnosis not present

## 2017-04-30 DIAGNOSIS — H1031 Unspecified acute conjunctivitis, right eye: Secondary | ICD-10-CM | POA: Diagnosis not present

## 2017-05-06 DIAGNOSIS — H1033 Unspecified acute conjunctivitis, bilateral: Secondary | ICD-10-CM | POA: Diagnosis not present

## 2017-05-11 DIAGNOSIS — H1033 Unspecified acute conjunctivitis, bilateral: Secondary | ICD-10-CM | POA: Diagnosis not present

## 2017-05-13 DIAGNOSIS — K439 Ventral hernia without obstruction or gangrene: Secondary | ICD-10-CM | POA: Diagnosis not present

## 2017-05-13 DIAGNOSIS — K4090 Unilateral inguinal hernia, without obstruction or gangrene, not specified as recurrent: Secondary | ICD-10-CM | POA: Diagnosis not present

## 2017-05-13 DIAGNOSIS — Z7901 Long term (current) use of anticoagulants: Secondary | ICD-10-CM | POA: Diagnosis not present

## 2017-05-13 DIAGNOSIS — I700 Atherosclerosis of aorta: Secondary | ICD-10-CM | POA: Diagnosis not present

## 2017-05-13 DIAGNOSIS — Z5181 Encounter for therapeutic drug level monitoring: Secondary | ICD-10-CM | POA: Diagnosis not present

## 2017-05-14 ENCOUNTER — Other Ambulatory Visit: Payer: Self-pay | Admitting: Surgery

## 2017-05-14 DIAGNOSIS — K439 Ventral hernia without obstruction or gangrene: Secondary | ICD-10-CM

## 2017-05-18 DIAGNOSIS — H1033 Unspecified acute conjunctivitis, bilateral: Secondary | ICD-10-CM | POA: Diagnosis not present

## 2017-05-19 ENCOUNTER — Ambulatory Visit
Admission: RE | Admit: 2017-05-19 | Discharge: 2017-05-19 | Disposition: A | Discharge status: Home / Self Care | Payer: PPO | Source: Ambulatory Visit | Attending: Surgery | Admitting: Surgery

## 2017-05-19 DIAGNOSIS — K449 Diaphragmatic hernia without obstruction or gangrene: Secondary | ICD-10-CM | POA: Diagnosis not present

## 2017-05-19 DIAGNOSIS — K439 Ventral hernia without obstruction or gangrene: Secondary | ICD-10-CM

## 2017-05-27 ENCOUNTER — Other Ambulatory Visit: Payer: Self-pay | Admitting: Surgery

## 2017-05-31 ENCOUNTER — Ambulatory Visit (INDEPENDENT_AMBULATORY_CARE_PROVIDER_SITE_OTHER): Payer: PPO | Admitting: General Practice

## 2017-05-31 DIAGNOSIS — Z5181 Encounter for therapeutic drug level monitoring: Secondary | ICD-10-CM | POA: Diagnosis not present

## 2017-05-31 LAB — POCT INR: INR: 2.3

## 2017-05-31 NOTE — Patient Instructions (Signed)
Pre visit review using our clinic review tool, if applicable. No additional management support is needed unless otherwise documented below in the visit note. 

## 2017-06-01 DIAGNOSIS — H1031 Unspecified acute conjunctivitis, right eye: Secondary | ICD-10-CM | POA: Diagnosis not present

## 2017-06-08 ENCOUNTER — Telehealth: Payer: Self-pay | Admitting: Family Medicine

## 2017-06-08 NOTE — Telephone Encounter (Signed)
April at [facility] [phone] says she faxed form on 05/31/17 requesting pt coumadin info for a hernia repaid tentatively scheduled for 06/28/17. verfd our fax number she will fax the form again.

## 2017-06-09 DIAGNOSIS — C61 Malignant neoplasm of prostate: Secondary | ICD-10-CM | POA: Diagnosis not present

## 2017-06-09 NOTE — Telephone Encounter (Signed)
I faxed form around noon today and did get a confirmation.

## 2017-06-24 DIAGNOSIS — L814 Other melanin hyperpigmentation: Secondary | ICD-10-CM | POA: Diagnosis not present

## 2017-06-24 DIAGNOSIS — D224 Melanocytic nevi of scalp and neck: Secondary | ICD-10-CM | POA: Diagnosis not present

## 2017-06-24 DIAGNOSIS — L570 Actinic keratosis: Secondary | ICD-10-CM | POA: Diagnosis not present

## 2017-06-24 DIAGNOSIS — Z85820 Personal history of malignant melanoma of skin: Secondary | ICD-10-CM | POA: Diagnosis not present

## 2017-06-24 DIAGNOSIS — D485 Neoplasm of uncertain behavior of skin: Secondary | ICD-10-CM | POA: Diagnosis not present

## 2017-06-24 DIAGNOSIS — Z85828 Personal history of other malignant neoplasm of skin: Secondary | ICD-10-CM | POA: Diagnosis not present

## 2017-06-24 DIAGNOSIS — L821 Other seborrheic keratosis: Secondary | ICD-10-CM | POA: Diagnosis not present

## 2017-06-25 DIAGNOSIS — H1031 Unspecified acute conjunctivitis, right eye: Secondary | ICD-10-CM | POA: Diagnosis not present

## 2017-07-08 ENCOUNTER — Encounter: Payer: Self-pay | Admitting: Family Medicine

## 2017-07-08 NOTE — Telephone Encounter (Signed)
He cannot take any NSAID while on Coumadin. The only OTC med he can take for fever or pain is Tylenol

## 2017-07-20 ENCOUNTER — Other Ambulatory Visit (HOSPITAL_COMMUNITY): Payer: Self-pay | Admitting: Emergency Medicine

## 2017-07-20 NOTE — Patient Instructions (Signed)
Donald Villegas  07/20/2017   Your procedure is scheduled on: 07-29-17  Report to [facility] Main  Entrance Take [facility]  elevators to 3rd floor to  [place] at [place].   Call this number if you have problems the morning of surgery [phone]    Remember: ONLY 1 PERSON MAY GO WITH YOU TO SHORT STAY TO GET  READY MORNING OF YOUR SURGERY.  Do not eat food or drink liquids :After Midnight.     Take these medicines the morning of surgery with A SIP OF WATER: pepcid                                You may not have any metal on your body including hair pins and              piercings  Do not wear jewelry, make-up, lotions, powders or perfumes, deodorant                        Men may shave face and neck.   Do not bring valuables to the hospital. [place].  Contacts, dentures or bridgework may not be worn into surgery.  Leave suitcase in the car. After surgery it may be brought to your room.                Please read over the following fact sheets you were given: _____________________________________________________________________           [facility] - Preparing for Surgery Before surgery, you can play an important role.  Because skin is not sterile, your skin needs to be as free of germs as possible.  You can reduce the number of germs on your skin by washing with CHG (chlorahexidine gluconate) soap before surgery.  CHG is an antiseptic cleaner which kills germs and bonds with the skin to continue killing germs even after washing. Please DO NOT use if you have an allergy to CHG or antibacterial soaps.  If your skin becomes reddened/irritated stop using the CHG and inform your nurse when you arrive at Short Stay. Do not shave (including legs and underarms) for at least 48 hours prior to the first CHG shower.  You may shave your face/neck. Please follow these instructions carefully:  1.  Shower with CHG Soap  the night before surgery and the  morning of Surgery.  2.  If you choose to wash your hair, wash your hair first as usual with your  normal  shampoo.  3.  After you shampoo, rinse your hair and body thoroughly to remove the  shampoo.                           4.  Use CHG as you would any other liquid soap.  You can apply chg directly  to the skin and wash                       Gently with a scrungie or clean washcloth.  5.  Apply the CHG Soap to your body ONLY FROM THE NECK DOWN.   Do not use on face/ open  Wound or open sores. Avoid contact with eyes, ears mouth and genitals (private parts).                       Wash face,  Genitals (private parts) with your normal soap.             6.  Wash thoroughly, paying special attention to the area where your surgery  will be performed.  7.  Thoroughly rinse your body with warm water from the neck down.  8.  DO NOT shower/wash with your normal soap after using and rinsing off  the CHG Soap.                9.  Pat yourself dry with a clean towel.            10.  Wear clean pajamas.            11.  Place clean sheets on your bed the night of your first shower and do not  sleep with pets. Day of Surgery : Do not apply any lotions/deodorants the morning of surgery.  Please wear clean clothes to the hospital/surgery center.  FAILURE TO FOLLOW THESE INSTRUCTIONS MAY RESULT IN THE CANCELLATION OF YOUR SURGERY PATIENT SIGNATURE_________________________________  NURSE SIGNATURE__________________________________  ________________________________________________________________________

## 2017-07-22 ENCOUNTER — Encounter (HOSPITAL_COMMUNITY)
Admission: RE | Admit: 2017-07-22 | Discharge: 2017-07-22 | Disposition: A | Discharge status: Home / Self Care | Payer: PPO | Source: Ambulatory Visit | Attending: Surgery | Admitting: Surgery

## 2017-07-22 ENCOUNTER — Encounter (HOSPITAL_COMMUNITY): Payer: Self-pay

## 2017-07-22 DIAGNOSIS — D689 Coagulation defect, unspecified: Secondary | ICD-10-CM | POA: Insufficient documentation

## 2017-07-22 DIAGNOSIS — Z01812 Encounter for preprocedural laboratory examination: Secondary | ICD-10-CM | POA: Diagnosis not present

## 2017-07-22 LAB — CBC WITH DIFFERENTIAL/PLATELET
BASOS PCT: 1 %
Basophils Absolute: 0 10*3/uL (ref 0.0–0.1)
EOS ABS: 0.2 10*3/uL (ref 0.0–0.7)
Eosinophils Relative: 5 %
HCT: 45.9 % (ref 39.0–52.0)
HEMOGLOBIN: 16.3 g/dL (ref 13.0–17.0)
LYMPHS ABS: 1.1 10*3/uL (ref 0.7–4.0)
Lymphocytes Relative: 27 %
MCH: 35 pg — ABNORMAL HIGH (ref 26.0–34.0)
MCHC: 35.5 g/dL (ref 30.0–36.0)
MCV: 98.5 fL (ref 78.0–100.0)
Monocytes Absolute: 0.4 10*3/uL (ref 0.1–1.0)
Monocytes Relative: 11 %
NEUTROS PCT: 56 %
Neutro Abs: 2.4 10*3/uL (ref 1.7–7.7)
Platelets: 172 10*3/uL (ref 150–400)
RBC: 4.66 MIL/uL (ref 4.22–5.81)
RDW: 14.1 % (ref 11.5–15.5)
WBC: 4.1 10*3/uL (ref 4.0–10.5)

## 2017-07-22 LAB — SURGICAL PCR SCREEN
MRSA, PCR: NEGATIVE
STAPHYLOCOCCUS AUREUS: NEGATIVE

## 2017-07-22 LAB — PROTIME-INR
INR: 1.87
PROTHROMBIN TIME: 21.3 s — AB (ref 11.4–15.2)

## 2017-07-22 LAB — APTT: aPTT: 34 seconds (ref 24–36)

## 2017-07-22 NOTE — Progress Notes (Signed)
PT result routed via epic to Dr Alphonsa Overall

## 2017-07-23 DIAGNOSIS — C61 Malignant neoplasm of prostate: Secondary | ICD-10-CM | POA: Diagnosis not present

## 2017-07-26 ENCOUNTER — Ambulatory Visit (INDEPENDENT_AMBULATORY_CARE_PROVIDER_SITE_OTHER): Payer: PPO | Admitting: General Practice

## 2017-07-26 DIAGNOSIS — Z5181 Encounter for therapeutic drug level monitoring: Secondary | ICD-10-CM | POA: Diagnosis not present

## 2017-07-26 DIAGNOSIS — Z7901 Long term (current) use of anticoagulants: Secondary | ICD-10-CM | POA: Insufficient documentation

## 2017-07-26 LAB — POCT INR: INR: 3.1

## 2017-07-26 NOTE — Patient Instructions (Addendum)
Pre visit review using our clinic review tool, if applicable. No additional management support is needed unless otherwise documented below in the visit note.  Lovenox bridge not needed per Dr. Sarajane Jews.  Resume coumadin after surgery.  Take 7.5 X 2 days and then 5 mg X 2 days and then resume current dosage.  Check INR 10/22.

## 2017-07-28 NOTE — H&P (Signed)
Donald Villegas  Location: [facility] Surgery Patient #: [id number] DOB: October 25, 1945 Married / Language: English / Race: White Male  History of Present Illness   The patient is a 71 year old male who presents with a complaint of hernia.  The PCP is Dr. Barbie Banner (he was seeing Dr. Rhunette Croft until recently)  The patient was referred by Dr. Barbie Banner. He came by himself.  The patient had a right inguinal hernia repaired by Dr. Osborn Coho in February 2012. He has noticed over the last year an increasing left-sided inguinal hernia. At one time, he was awoken at night with severe abdominal pain, but the pain was brief, and he attributes the pain to the left inguinal hernia. He does have some gurgling sounds in the left inguinal hernia. Simultaneously he has noticed a increasing bulge above his umbilicus of an abdominal wall hernia. He stopped wearing knit shirts because of the bulge. This hernia does not cause any pain, though he may have some mild discomfort. It does not reduce easily when he lays down. He is unaware of any association between this ventral hernia and the robotic surgery.   I discussed the indications and complications of hernia surgery with the patient. I discussed both the laparoscopic and open approach to hernia repair.. The potential risks of hernia surgery include, but are not limited to, bleeding, infection, open surgery, nerve injury, and recurrence of the hernia. I provided the patient literature about hernia surgery.  Plan: 1) get CT of abdomen to see the size of the ventral hernia, 2) contact Dr. Sarajane Jews about the management of the coumadin, 3) Will talk to patient after CT scan about whether to do both or one hernia at a time  Past Medical History: 1. Bilateral peroneal nerve neuropathy 2. Atherosclerosis - CT angio 12/31/2014 3. On coumadin for recurrent PE The first around 2000, the second around 2010 - no on life time  coumadin On 5 mg 5 days a week and 2.5 mg on Sun/Wed He gets his INR checked every 8 weeks. 4. History of PE 5. Prostate cancer Had robotic prostatectomy - 2010 - Dr. Ardath Sax in [facility] urology Increasing PSA - had salvage rad tx by dr. Valere Dross (April 2015) PSA up a little now again. His PSA on 07-09-15 was 0.27 but it has gone up to 0.58 as of 11-26-15. 6. Prior right inguinal hernia repair by Dr. Margot Chimes - 12/06/2010 Large direct and indirect right inguinal hernia 7. History of a melanoma of the left arm - excised by Dr. Annamaria Boots Followed now by Dr. Ronnald Ramp  Social History:  Wife, Bev Daughter, Anda Kraft, who lives in [place].  We will schedule this for mid September. [name] has work with the [employer] in early September.   Past Surgical History (April Staton, CMA; 05/13/2017 9:54 AM) Knee Surgery  Left. Laparoscopic Inguinal Hernia Surgery  Right. Prostate Surgery - Removal   Diagnostic Studies History (April Staton, [place]; 05/13/2017 9:54 AM) Colonoscopy  1-5 years ago  Allergies (April Staton, CMA; 05/13/2017 9:57 AM) No Known Drug Allergies 05/13/2017  Medication History (April Staton, CMA; 05/13/2017 9:58 AM) Warfarin Sodium (5MG  Tablet, Oral) Active. Medications Reconciled  Social History (April Staton, CMA; 05/13/2017 9:54 AM) Alcohol use  Occasional alcohol use. Caffeine use  Coffee. No drug use  Tobacco use  Former smoker.  Family History (April Staton, [place]; 05/13/2017 9:54 AM) Alcohol Abuse  Mother. Colon Cancer  Family Members In General.  Other Problems (April Staton, [place]; 05/13/2017 9:54  AM) Arthritis  Gastroesophageal Reflux Disease  Inguinal Hernia  Prostate Cancer  Pulmonary Embolism / Blood Clot in Legs     Review of Systems (April Staton CMA; 05/13/2017 9:54 AM) General Not Present- Appetite Loss, Chills, Fatigue, Fever, Night Sweats, Weight Gain and Weight Loss. Skin Not Present- Change in Wart/Mole,  Dryness, Hives, Jaundice, New Lesions, Non-Healing Wounds, Rash and Ulcer. HEENT Present- Seasonal Allergies and Wears glasses/contact lenses. Not Present- Earache, Hearing Loss, Hoarseness, Nose Bleed, Oral Ulcers, Ringing in the Ears, Sinus Pain, Sore Throat, Visual Disturbances and Yellow Eyes. Respiratory Not Present- Bloody sputum, Chronic Cough, Difficulty Breathing, Snoring and Wheezing. Breast Not Present- Breast Mass, Breast Pain, Nipple Discharge and Skin Changes. Cardiovascular Present- Leg Cramps. Not Present- Chest Pain, Difficulty Breathing Lying Down, Palpitations, Rapid Heart Rate, Shortness of Breath and Swelling of Extremities. Gastrointestinal Present- Indigestion. Not Present- Abdominal Pain, Bloating, Bloody Stool, Change in Bowel Habits, Chronic diarrhea, Constipation, Difficulty Swallowing, Excessive gas, Gets full quickly at meals, Hemorrhoids, Nausea, Rectal Pain and Vomiting. Male Genitourinary Not Present- Blood in Urine, Change in Urinary Stream, Frequency, Impotence, Nocturia, Painful Urination, Urgency and Urine Leakage. Musculoskeletal Present- Joint Stiffness. Not Present- Back Pain, Joint Pain, Muscle Pain, Muscle Weakness and Swelling of Extremities. Neurological Present- Numbness. Not Present- Decreased Memory, Fainting, Headaches, Seizures, Tingling, Tremor, Trouble walking and Weakness. Psychiatric Not Present- Anxiety, Bipolar, Change in Sleep Pattern, Depression, Fearful and Frequent crying. Endocrine Not Present- Cold Intolerance, Excessive Hunger, Hair Changes, Heat Intolerance, Hot flashes and New Diabetes. Hematology Present- Blood Thinners. Not Present- Easy Bruising, Excessive bleeding, Gland problems, HIV and Persistent Infections.  Vitals (April Staton CMA; 05/13/2017 9:59 AM) 05/13/2017 9:58 AM Weight: 186 lb Height: 70in Body Surface Area: 2.02 m Body Mass Index: 26.69 kg/m  Temp.: 98.90F(Oral)  Pulse: 61 (Regular)  BP: 130/84 (Sitting,  Left Arm, Standard)   Physical Exam  General: WN Thinner WM alert and generally healthy appearing. Skin: Inspection and palpation of the skin unremarkable.  Eyes: Conjunctivae white, pupils equal. Face, ears, nose, mouth, and throat: Face - normal. Normal ears and nose. Lips and teeth normal.  Neck: Supple. No mass. Trachea midline. No thyroid mass.  Lymph Nodes: No supraclavicular or cervical adenopathy. No inguinal adenopathy.  Lungs: Normal respiratory effort. Clear to auscultation and symmetric breath sounds. Cardiovascular: Regular rate and rythm. Normal auscultation of the heart. No murmur or rub.  Abdomen: Soft. No mass. Liver and spleen not palpable. No tenderness. Normal bowel sounds.  Approx 6 - 7 cm bulge above the umbilicus. I think that this is a hernia at one of the robotic trocar sites, but not sure. The hernia does not entirely reduce. I cannot feel the edeges of the hernia defect - therefore, I think that a CT scan could be helpful. Left inguinal hernia - medium large, this does reduce with supine position.  Musculoskeletal/extremities: Normal gait. Good strength and ROM in upper and lower extremities.   Neurologic: Grossly intact to motor and sensory function.  Psychiatric: Has normal mood and affect. Judgement and insight appear normal.   Assessment & Plan  1. INGUINAL HERNIA OF LEFT SIDE WITHOUT OBSTRUCTION OR GANGRENE (K40.90)  Plan:  1) CT of abdomen to evaluate abdominal wall hernia 05/19/2017 :  1. Diastases recti with 3 separate omental fat containing supraumbilical ventral abdominal wall hernias as detailed above.   2. Coronary artery calcifications.   3. Small hiatal hernia.   4. Benign hepatic cysts.   5.  Aortic Atherosclerosis (ICD10-170.0)   6.  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  2) Will send note to Dr. Sarajane Jews about how to handle his coumadin - stop vs  bridge Pt scheduled for left inguinal hernia on 10/11. Rcvd fax today fm Dr. Sarajane Jews in which states  "Lovenox bridge not needed per Dr. Sarajane Jews. Resume coumadin after surgery. Take 7.5 x 2 days and then 5 mg x 2 days and then resume current dosage. Check INR 10/22".   3) Open repair of left inguinal hernia and laparoscopic ventral hernia surgery  2.  ABDOMINAL WALL HERNIA (K43.9) 3.  ANTICOAGULATED ON COUMADIN (Z51.81)  On coumadin for recurrent PE  The first around 2000, the second around 2010 - no on life time coumadin  On 5 mg 5 days a week and 2.5 mg on Sun/Wed  He gets his INR checked every 8 weeks. 4.  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 Bilateral peroneal nerve neuropathy 6. History of PE 7. Prostate cancer Had robotic prostatectomy - 2010 - Dr. Ardath Sax in [facility] urology Increasing PSA - had salvage rad tx by dr. Valere Dross (April 2015) PSA up a little now again. His PSA on 07-09-15 was 0.27 but it has gone up to 0.58 as of 11-26-15. 8. History of a melanoma of the left arm - excised by Dr. Annamaria Boots Followed now by Dr. Georgia Dom, MD, [facility] Pager: [phone] Office phone:  [phone]

## 2017-07-29 ENCOUNTER — Encounter (HOSPITAL_COMMUNITY)
Admission: RE | Disposition: A | Discharge status: Home / Self Care | Payer: Self-pay | Source: Ambulatory Visit | Attending: Surgery

## 2017-07-29 ENCOUNTER — Observation Stay (HOSPITAL_COMMUNITY)
Admission: RE | Admit: 2017-07-29 | Discharge: 2017-07-31 | Disposition: A | Discharge status: Home / Self Care | Payer: PPO | Source: Ambulatory Visit | Attending: Surgery | Admitting: Surgery

## 2017-07-29 ENCOUNTER — Inpatient Hospital Stay (HOSPITAL_COMMUNITY): Payer: PPO | Admitting: Anesthesiology

## 2017-07-29 ENCOUNTER — Encounter (HOSPITAL_COMMUNITY): Payer: Self-pay | Admitting: Anesthesiology

## 2017-07-29 DIAGNOSIS — Z79899 Other long term (current) drug therapy: Secondary | ICD-10-CM | POA: Diagnosis not present

## 2017-07-29 DIAGNOSIS — K219 Gastro-esophageal reflux disease without esophagitis: Secondary | ICD-10-CM | POA: Diagnosis not present

## 2017-07-29 DIAGNOSIS — K449 Diaphragmatic hernia without obstruction or gangrene: Secondary | ICD-10-CM | POA: Diagnosis not present

## 2017-07-29 DIAGNOSIS — M5136 Other intervertebral disc degeneration, lumbar region: Secondary | ICD-10-CM | POA: Insufficient documentation

## 2017-07-29 DIAGNOSIS — M1990 Unspecified osteoarthritis, unspecified site: Secondary | ICD-10-CM | POA: Insufficient documentation

## 2017-07-29 DIAGNOSIS — K4090 Unilateral inguinal hernia, without obstruction or gangrene, not specified as recurrent: Principal | ICD-10-CM | POA: Insufficient documentation

## 2017-07-29 DIAGNOSIS — I700 Atherosclerosis of aorta: Secondary | ICD-10-CM | POA: Insufficient documentation

## 2017-07-29 DIAGNOSIS — Z86718 Personal history of other venous thrombosis and embolism: Secondary | ICD-10-CM | POA: Insufficient documentation

## 2017-07-29 DIAGNOSIS — Z7901 Long term (current) use of anticoagulants: Secondary | ICD-10-CM | POA: Diagnosis not present

## 2017-07-29 DIAGNOSIS — Z23 Encounter for immunization: Secondary | ICD-10-CM | POA: Diagnosis not present

## 2017-07-29 DIAGNOSIS — G8918 Other acute postprocedural pain: Secondary | ICD-10-CM | POA: Diagnosis not present

## 2017-07-29 DIAGNOSIS — Z85820 Personal history of malignant melanoma of skin: Secondary | ICD-10-CM | POA: Insufficient documentation

## 2017-07-29 DIAGNOSIS — Z8546 Personal history of malignant neoplasm of prostate: Secondary | ICD-10-CM | POA: Insufficient documentation

## 2017-07-29 DIAGNOSIS — Z86711 Personal history of pulmonary embolism: Secondary | ICD-10-CM | POA: Diagnosis not present

## 2017-07-29 DIAGNOSIS — M4316 Spondylolisthesis, lumbar region: Secondary | ICD-10-CM | POA: Insufficient documentation

## 2017-07-29 DIAGNOSIS — K7689 Other specified diseases of liver: Secondary | ICD-10-CM | POA: Insufficient documentation

## 2017-07-29 DIAGNOSIS — N529 Male erectile dysfunction, unspecified: Secondary | ICD-10-CM | POA: Diagnosis not present

## 2017-07-29 DIAGNOSIS — Z87891 Personal history of nicotine dependence: Secondary | ICD-10-CM | POA: Diagnosis not present

## 2017-07-29 DIAGNOSIS — G5733 Lesion of lateral popliteal nerve, bilateral lower limbs: Secondary | ICD-10-CM | POA: Insufficient documentation

## 2017-07-29 DIAGNOSIS — K436 Other and unspecified ventral hernia with obstruction, without gangrene: Secondary | ICD-10-CM | POA: Diagnosis not present

## 2017-07-29 DIAGNOSIS — I2699 Other pulmonary embolism without acute cor pulmonale: Secondary | ICD-10-CM | POA: Diagnosis not present

## 2017-07-29 HISTORY — PX: INGUINAL HERNIA REPAIR: SHX194

## 2017-07-29 HISTORY — PX: VENTRAL HERNIA REPAIR: SHX424

## 2017-07-29 LAB — PROTIME-INR
INR: 1.37
Prothrombin Time: 16.7 seconds — ABNORMAL HIGH (ref 11.4–15.2)

## 2017-07-29 SURGERY — REPAIR, HERNIA, INGUINAL, ADULT
Anesthesia: General

## 2017-07-29 MED ORDER — ONDANSETRON 4 MG PO TBDP: 4.0000 mg | ORAL_TABLET | Freq: Four times a day (QID) | ORAL | Status: DC | PRN

## 2017-07-29 MED ORDER — CHLORHEXIDINE GLUCONATE CLOTH 2 % EX PADS: 6.0000 | MEDICATED_PAD | Freq: Once | CUTANEOUS | Status: DC

## 2017-07-29 MED ORDER — MORPHINE SULFATE (PF) 2 MG/ML IV SOLN: 1.0000 mg | INTRAVENOUS | Status: DC | PRN

## 2017-07-29 MED ORDER — HYDROCODONE-ACETAMINOPHEN 5-325 MG PO TABS
1.0000 | ORAL_TABLET | ORAL | Status: DC | PRN
  Administered 2017-07-31: 1 via ORAL
  Administered 2017-07-31: 2 via ORAL
  Filled 2017-07-29: qty 1
  Filled 2017-07-29: qty 2

## 2017-07-29 MED ORDER — OXYCODONE HCL 5 MG PO TABS: 5.0000 mg | ORAL_TABLET | Freq: Once | ORAL | Status: DC | PRN

## 2017-07-29 MED ORDER — BUPIVACAINE HCL (PF) 0.25 % IJ SOLN
INTRAMUSCULAR | Status: AC
  Filled 2017-07-29: qty 30

## 2017-07-29 MED ORDER — CEFAZOLIN SODIUM-DEXTROSE 2-4 GM/100ML-% IV SOLN
2.0000 g | INTRAVENOUS | Status: AC
  Administered 2017-07-29: 2 g via INTRAVENOUS
  Filled 2017-07-29: qty 100

## 2017-07-29 MED ORDER — SODIUM BICARBONATE 4 % IV SOLN
INTRAVENOUS | Status: AC
  Filled 2017-07-29: qty 5

## 2017-07-29 MED ORDER — ROCURONIUM BROMIDE 10 MG/ML (PF) SYRINGE
PREFILLED_SYRINGE | INTRAVENOUS | Status: DC | PRN
  Administered 2017-07-29 (×2): 10 mg via INTRAVENOUS
  Administered 2017-07-29: 40 mg via INTRAVENOUS

## 2017-07-29 MED ORDER — GABAPENTIN 300 MG PO CAPS
300.0000 mg | ORAL_CAPSULE | ORAL | Status: AC
  Administered 2017-07-29: 300 mg via ORAL
  Filled 2017-07-29: qty 1

## 2017-07-29 MED ORDER — ROCURONIUM BROMIDE 50 MG/5ML IV SOSY
PREFILLED_SYRINGE | INTRAVENOUS | Status: AC
  Filled 2017-07-29: qty 10

## 2017-07-29 MED ORDER — PROPOFOL 10 MG/ML IV BOLUS
INTRAVENOUS | Status: AC
  Filled 2017-07-29: qty 20

## 2017-07-29 MED ORDER — SUGAMMADEX SODIUM 200 MG/2ML IV SOLN
INTRAVENOUS | Status: DC | PRN
  Administered 2017-07-29: 175 mg via INTRAVENOUS

## 2017-07-29 MED ORDER — SODIUM CHLORIDE 0.9 % IJ SOLN
INTRAMUSCULAR | Status: DC | PRN
  Administered 2017-07-29: 10 mL

## 2017-07-29 MED ORDER — KETAMINE HCL 10 MG/ML IJ SOLN
INTRAMUSCULAR | Status: DC | PRN
  Administered 2017-07-29: 30 mg via INTRAVENOUS

## 2017-07-29 MED ORDER — LIDOCAINE 2% (20 MG/ML) 5 ML SYRINGE
INTRAMUSCULAR | Status: DC | PRN
  Administered 2017-07-29: .5 mg/kg/h via INTRAVENOUS

## 2017-07-29 MED ORDER — FENTANYL CITRATE (PF) 100 MCG/2ML IJ SOLN
INTRAMUSCULAR | Status: AC
  Filled 2017-07-29: qty 2

## 2017-07-29 MED ORDER — ONDANSETRON HCL 4 MG/2ML IJ SOLN: 4.0000 mg | Freq: Once | INTRAMUSCULAR | Status: DC | PRN

## 2017-07-29 MED ORDER — TRAMADOL HCL 50 MG PO TABS
50.0000 mg | ORAL_TABLET | Freq: Four times a day (QID) | ORAL | Status: DC | PRN
Start: 2017-07-29 — End: 2017-07-31

## 2017-07-29 MED ORDER — SUGAMMADEX SODIUM 200 MG/2ML IV SOLN
INTRAVENOUS | Status: AC
  Filled 2017-07-29: qty 2

## 2017-07-29 MED ORDER — SODIUM CHLORIDE 0.9 % IJ SOLN
INTRAMUSCULAR | Status: AC
  Filled 2017-07-29: qty 50

## 2017-07-29 MED ORDER — EPHEDRINE SULFATE-NACL 50-0.9 MG/10ML-% IV SOSY
PREFILLED_SYRINGE | INTRAVENOUS | Status: DC | PRN
  Administered 2017-07-29: 10 mg via INTRAVENOUS

## 2017-07-29 MED ORDER — BUPIVACAINE LIPOSOME 1.3 % IJ SUSP
INTRAMUSCULAR | Status: DC | PRN
  Administered 2017-07-29: 30 mL

## 2017-07-29 MED ORDER — KCL IN DEXTROSE-NACL 20-5-0.45 MEQ/L-%-% IV SOLN
INTRAVENOUS | Status: DC
  Administered 2017-07-29 – 2017-07-30 (×2): via INTRAVENOUS
  Filled 2017-07-29 (×3): qty 1000

## 2017-07-29 MED ORDER — FENTANYL CITRATE (PF) 100 MCG/2ML IJ SOLN
INTRAMUSCULAR | Status: AC
  Administered 2017-07-29: 50 ug via INTRAVENOUS
  Filled 2017-07-29: qty 2

## 2017-07-29 MED ORDER — LACTATED RINGERS IV SOLN
INTRAVENOUS | Status: DC
  Administered 2017-07-29: 12:00:00 via INTRAVENOUS
  Administered 2017-07-29: 1000 mL via INTRAVENOUS

## 2017-07-29 MED ORDER — ACETAMINOPHEN 500 MG PO TABS
1000.0000 mg | ORAL_TABLET | ORAL | Status: AC
  Administered 2017-07-29: 1000 mg via ORAL
  Filled 2017-07-29: qty 2

## 2017-07-29 MED ORDER — KETAMINE HCL-SODIUM CHLORIDE 100-0.9 MG/10ML-% IV SOSY
PREFILLED_SYRINGE | INTRAVENOUS | Status: AC
  Filled 2017-07-29: qty 10

## 2017-07-29 MED ORDER — ACETAMINOPHEN 325 MG PO TABS
650.0000 mg | ORAL_TABLET | Freq: Four times a day (QID) | ORAL | Status: DC
  Administered 2017-07-29 – 2017-07-31 (×7): 650 mg via ORAL
  Filled 2017-07-29 (×8): qty 2

## 2017-07-29 MED ORDER — INFLUENZA VAC SPLIT HIGH-DOSE 0.5 ML IM SUSY
0.5000 mL | PREFILLED_SYRINGE | INTRAMUSCULAR | Status: AC
  Administered 2017-07-30: 0.5 mL via INTRAMUSCULAR
  Filled 2017-07-29: qty 0.5

## 2017-07-29 MED ORDER — ONDANSETRON HCL 4 MG/2ML IJ SOLN
INTRAMUSCULAR | Status: AC
  Filled 2017-07-29: qty 2

## 2017-07-29 MED ORDER — PNEUMOCOCCAL VAC POLYVALENT 25 MCG/0.5ML IJ INJ
0.5000 mL | INJECTION | INTRAMUSCULAR | Status: DC
  Filled 2017-07-29 (×2): qty 0.5

## 2017-07-29 MED ORDER — PROPOFOL 10 MG/ML IV BOLUS
INTRAVENOUS | Status: DC | PRN
  Administered 2017-07-29: 140 mg via INTRAVENOUS

## 2017-07-29 MED ORDER — MIDAZOLAM HCL 2 MG/2ML IJ SOLN
INTRAMUSCULAR | Status: AC
  Administered 2017-07-29: 1 mg via INTRAVENOUS
  Filled 2017-07-29: qty 2

## 2017-07-29 MED ORDER — ONDANSETRON HCL 4 MG/2ML IJ SOLN
INTRAMUSCULAR | Status: DC | PRN
  Administered 2017-07-29: 4 mg via INTRAVENOUS

## 2017-07-29 MED ORDER — HYDROMORPHONE HCL 1 MG/ML IJ SOLN: 0.2500 mg | INTRAMUSCULAR | Status: DC | PRN

## 2017-07-29 MED ORDER — DEXAMETHASONE SODIUM PHOSPHATE 10 MG/ML IJ SOLN
INTRAMUSCULAR | Status: DC | PRN
  Administered 2017-07-29: 7 mg via INTRAVENOUS

## 2017-07-29 MED ORDER — FAMOTIDINE 20 MG PO TABS
10.0000 mg | ORAL_TABLET | Freq: Two times a day (BID) | ORAL | Status: DC
  Administered 2017-07-29 – 2017-07-31 (×4): 10 mg via ORAL
  Filled 2017-07-29 (×4): qty 1

## 2017-07-29 MED ORDER — MIDAZOLAM HCL 2 MG/2ML IJ SOLN
2.0000 mg | Freq: Once | INTRAMUSCULAR | Status: AC
  Administered 2017-07-29: 1 mg via INTRAVENOUS

## 2017-07-29 MED ORDER — OXYCODONE HCL 5 MG/5ML PO SOLN: 5.0000 mg | Freq: Once | ORAL | Status: DC | PRN

## 2017-07-29 MED ORDER — DEXAMETHASONE SODIUM PHOSPHATE 10 MG/ML IJ SOLN
INTRAMUSCULAR | Status: AC
  Filled 2017-07-29: qty 1

## 2017-07-29 MED ORDER — HEPARIN SODIUM (PORCINE) 5000 UNIT/ML IJ SOLN
5000.0000 [IU] | Freq: Three times a day (TID) | INTRAMUSCULAR | Status: DC
  Administered 2017-07-29 – 2017-07-31 (×5): 5000 [IU] via SUBCUTANEOUS
  Filled 2017-07-29 (×5): qty 1

## 2017-07-29 MED ORDER — LIDOCAINE 2% (20 MG/ML) 5 ML SYRINGE
INTRAMUSCULAR | Status: AC
  Filled 2017-07-29: qty 10

## 2017-07-29 MED ORDER — LIDOCAINE 2% (20 MG/ML) 5 ML SYRINGE
INTRAMUSCULAR | Status: DC | PRN
Start: 2017-07-29 — End: 2017-07-29
  Administered 2017-07-29: 40 mg via INTRAVENOUS

## 2017-07-29 MED ORDER — FENTANYL CITRATE (PF) 100 MCG/2ML IJ SOLN
INTRAMUSCULAR | Status: DC | PRN
  Administered 2017-07-29 (×3): 50 ug via INTRAVENOUS

## 2017-07-29 MED ORDER — FENTANYL CITRATE (PF) 100 MCG/2ML IJ SOLN
100.0000 ug | Freq: Once | INTRAMUSCULAR | Status: AC
  Administered 2017-07-29: 50 ug via INTRAVENOUS

## 2017-07-29 MED ORDER — ONDANSETRON HCL 4 MG/2ML IJ SOLN: 4.0000 mg | Freq: Four times a day (QID) | INTRAMUSCULAR | Status: DC | PRN

## 2017-07-29 MED ORDER — BUPIVACAINE LIPOSOME 1.3 % IJ SUSP
20.0000 mL | Freq: Once | INTRAMUSCULAR | Status: DC
  Filled 2017-07-29: qty 20

## 2017-07-29 SURGICAL SUPPLY — 67 items
ADH SKN CLS APL DERMABOND .7 (GAUZE/BANDAGES/DRESSINGS) ×2
APL SKNCLS STERI-STRIP NONHPOA (GAUZE/BANDAGES/DRESSINGS) ×2
BENZOIN TINCTURE PRP APPL 2/3 (GAUZE/BANDAGES/DRESSINGS) ×3 IMPLANT
BINDER ABDOMINAL 12 ML 46-62 (SOFTGOODS) ×3 IMPLANT
BLADE SURG SZ10 CARB STEEL (BLADE) ×3 IMPLANT
CHLORAPREP W/TINT 26ML (MISCELLANEOUS) ×3 IMPLANT
COVER SURGICAL LIGHT HANDLE (MISCELLANEOUS) ×3 IMPLANT
DECANTER SPIKE VIAL GLASS SM (MISCELLANEOUS) ×2 IMPLANT
DERMABOND ADVANCED (GAUZE/BANDAGES/DRESSINGS) ×1
DERMABOND ADVANCED .7 DNX12 (GAUZE/BANDAGES/DRESSINGS) IMPLANT
DEVICE SECURE STRAP 25 ABSORB (INSTRUMENTS) ×2 IMPLANT
DEVICE TROCAR PUNCTURE CLOSURE (ENDOMECHANICALS) ×3 IMPLANT
DISSECTOR ROUND CHERRY 3/8 STR (MISCELLANEOUS) ×1 IMPLANT
DRAIN PENROSE 18X1/2 LTX STRL (DRAIN) ×3 IMPLANT
DRAPE INCISE IOBAN 66X45 STRL (DRAPES) ×3 IMPLANT
DRAPE LAPAROTOMY TRNSV 102X78 (DRAPE) ×3 IMPLANT
ELECT PENCIL ROCKER SW 15FT (MISCELLANEOUS) ×3 IMPLANT
ELECT REM PT RETURN 15FT ADLT (MISCELLANEOUS) ×3 IMPLANT
GAUZE SPONGE 4X4 12PLY STRL (GAUZE/BANDAGES/DRESSINGS) ×2 IMPLANT
GLOVE BIOGEL PI IND STRL 7.0 (GLOVE) ×2 IMPLANT
GLOVE BIOGEL PI INDICATOR 7.0 (GLOVE)
GLOVE SURG SIGNA 7.5 PF LTX (GLOVE) ×8 IMPLANT
GOWN SPEC L4 XLG W/TWL (GOWN DISPOSABLE) ×2 IMPLANT
GOWN STRL REUS W/TWL LRG LVL3 (GOWN DISPOSABLE) ×5 IMPLANT
GOWN STRL REUS W/TWL XL LVL3 (GOWN DISPOSABLE) ×9 IMPLANT
KIT BASIN OR (CUSTOM PROCEDURE TRAY) ×3 IMPLANT
MARKER SKIN DUAL TIP RULER LAB (MISCELLANEOUS) ×3 IMPLANT
MESH PARIETEX 20X15 (Mesh General) ×1 IMPLANT
MESH ULTRAPRO 3X6 7.6X15CM (Mesh General) ×1 IMPLANT
NDL HYPO 25X1 1.5 SAFETY (NEEDLE) ×2 IMPLANT
NDL SPNL 22GX3.5 QUINCKE BK (NEEDLE) ×2 IMPLANT
NEEDLE HYPO 25X1 1.5 SAFETY (NEEDLE) IMPLANT
NEEDLE SPNL 22GX3.5 QUINCKE BK (NEEDLE) ×3 IMPLANT
NS IRRIG 1000ML POUR BTL (IV SOLUTION) ×3 IMPLANT
PACK BASIC VI WITH GOWN DISP (CUSTOM PROCEDURE TRAY) ×2 IMPLANT
PAD POSITIONING PINK XL (MISCELLANEOUS) ×1 IMPLANT
POSITIONER SURGICAL ARM (MISCELLANEOUS) IMPLANT
SCISSORS LAP 5X35 DISP (ENDOMECHANICALS) ×3 IMPLANT
SET IRRIG TUBING LAPAROSCOPIC (IRRIGATION / IRRIGATOR) IMPLANT
SHEARS HARMONIC ACE PLUS 36CM (ENDOMECHANICALS) IMPLANT
SLEEVE ADV FIXATION 5X100MM (TROCAR) IMPLANT
SPONGE LAP 18X18 X RAY DECT (DISPOSABLE) ×3 IMPLANT
SPONGE LAP 4X18 X RAY DECT (DISPOSABLE) IMPLANT
STAPLER VISISTAT 35W (STAPLE) IMPLANT
STRIP CLOSURE SKIN 1/2X4 (GAUZE/BANDAGES/DRESSINGS) IMPLANT
STRIP CLOSURE SKIN 1/4X4 (GAUZE/BANDAGES/DRESSINGS) IMPLANT
SUT CHROMIC 0 SH (SUTURE) IMPLANT
SUT MNCRL AB 4-0 PS2 18 (SUTURE) ×3 IMPLANT
SUT NOVA 0 T19/GS 22DT (SUTURE) ×10 IMPLANT
SUT NOVA NAB DX-16 0-1 5-0 T12 (SUTURE) IMPLANT
SUT NOVA NAB GS-21 0 18 T12 DT (SUTURE) ×2 IMPLANT
SUT VIC AB 2-0 SH 27 (SUTURE) ×6
SUT VIC AB 2-0 SH 27X BRD (SUTURE) ×2 IMPLANT
SUT VIC AB 3-0 SH 18 (SUTURE) ×4 IMPLANT
SUT VICRYL 0 TIES 12 18 (SUTURE) ×1 IMPLANT
SYR BULB IRRIGATION 50ML (SYRINGE) ×2 IMPLANT
SYR CONTROL 10ML LL (SYRINGE) ×2 IMPLANT
TACKER 5MM HERNIA 3.5CML NAB (ENDOMECHANICALS) IMPLANT
TAPE CLOTH 4X10 WHT NS (GAUZE/BANDAGES/DRESSINGS) IMPLANT
TOWEL OR 17X26 10 PK STRL BLUE (TOWEL DISPOSABLE) ×4 IMPLANT
TRAY FOLEY W/METER SILVER 16FR (SET/KITS/TRAYS/PACK) ×3 IMPLANT
TRAY LAPAROSCOPIC (CUSTOM PROCEDURE TRAY) ×3 IMPLANT
TROCAR ADV FIXATION 5X100MM (TROCAR) ×1 IMPLANT
TROCAR HASSON 5MM (TROCAR) IMPLANT
TROCAR XCEL NON-BLD 11X100MML (ENDOMECHANICALS) ×1 IMPLANT
TUBING INSUF HEATED (TUBING) ×1 IMPLANT
YANKAUER SUCT BULB TIP 10FT TU (MISCELLANEOUS) ×3 IMPLANT

## 2017-07-29 NOTE — Anesthesia Postprocedure Evaluation (Signed)
Anesthesia Post Note  Patient: Donald Villegas  Procedure(s) Performed: OPEN LEFT INGUINAL HERNIA REPAIR ERAS PATHWAY, WITH MESH (Left ) LAPAROSCOPIC VENTRAL HERNIA REPAIR WITH MESH (N/A )     Patient location during evaluation: PACU Anesthesia Type: General Level of consciousness: awake, awake and alert and oriented Pain management: pain level controlled Vital Signs Assessment: post-procedure vital signs reviewed and stable Respiratory status: spontaneous breathing, nonlabored ventilation and respiratory function stable Cardiovascular status: blood pressure returned to baseline Anesthetic complications: no    Last Vitals:  Vitals:   07/29/17 1322 07/29/17 1343  BP: 135/77 (!) 146/78  Pulse: 66 67  Resp: 12 12  Temp: (!) 36.4 C 36.4 C  SpO2: 96% 96%    Last Pain:  Vitals:   07/29/17 1352  TempSrc:   PainSc: 2                  Chermaine Schnyder COKER

## 2017-07-29 NOTE — Anesthesia Procedure Notes (Signed)
Procedure Name: Intubation Date/Time: 07/29/2017 9:17 AM Performed by: Anne Fu Pre-anesthesia Checklist: Patient identified, Emergency Drugs available, Suction available, Patient being monitored and Timeout performed Patient Re-evaluated:Patient Re-evaluated prior to induction Oxygen Delivery Method: Circle system utilized Preoxygenation: Pre-oxygenation with 100% oxygen Induction Type: IV induction Ventilation: Mask ventilation without difficulty Laryngoscope Size: Mac and 4 Grade View: Grade I Tube type: Oral Tube size: 7.5 mm Number of attempts: 1 Airway Equipment and Method: Stylet Placement Confirmation: ETT inserted through vocal cords under direct vision,  positive ETCO2 and breath sounds checked- equal and bilateral Secured at: 21 cm Tube secured with: Tape Dental Injury: Teeth and Oropharynx as per pre-operative assessment

## 2017-07-29 NOTE — Progress Notes (Signed)
Assisted Dr. Joslin with left, ultrasound guided, transabdominal plane block. Side rails up, monitors on throughout procedure. See vital signs in flow sheet. Tolerated Procedure well. 

## 2017-07-29 NOTE — Anesthesia Procedure Notes (Addendum)
Anesthesia Regional Block: TAP block   Pre-Anesthetic Checklist: ,, timeout performed, Correct Patient, Correct Site, Correct Laterality, Correct Procedure, Correct Position, site marked, Risks and benefits discussed,  Surgical consent,  Pre-op evaluation,  At surgeon's request and post-op pain management  Laterality: Left  Prep: chloraprep       Needles:   Needle Type: Echogenic Stimulator Needle     Needle Length: 9cm  Needle Gauge: 22     Additional Needles:   Procedures:,,,, ultrasound used (permanent image in chart),,,,  Narrative:  Start time: 07/29/2017 8:50 AM End time: 07/29/2017 8:55 AM Injection made incrementally with aspirations every 5 mL.  Performed by: Personally  Anesthesiologist: Corleen Otwell  Additional Notes: 30 cc 0.5% Bupivacaine with 1:200 Epi injected easily

## 2017-07-29 NOTE — Transfer of Care (Signed)
Immediate Anesthesia Transfer of Care Note  Patient: Donald Villegas  Procedure(s) Performed: Procedure(s): OPEN LEFT INGUINAL HERNIA REPAIR ERAS PATHWAY, WITH MESH (Left) LAPAROSCOPIC VENTRAL HERNIA REPAIR WITH MESH (N/A)  Patient Location: PACU  Anesthesia Type:General  Level of Consciousness:  sedated, patient cooperative and responds to stimulation  Airway & Oxygen Therapy:Patient Spontanous Breathing and Patient connected to face mask oxgen  Post-op Assessment:  Report given to PACU RN and Post -op Vital signs reviewed and stable  Post vital signs:  Reviewed and stable  Last Vitals:  Vitals:   07/29/17 0856 07/29/17 0901  BP: 125/73 127/70  Pulse: 60 (!) 56  Resp: 11 12  Temp:    SpO2: 09% 38%    Complications: No apparent anesthesia complications

## 2017-07-29 NOTE — Op Note (Signed)
OPERATIVE NOTE  07/29/2017  12:22 PM  PATIENT:  Donald Villegas, 71 y.o., male, MRN: [id number]  PREOP DIAGNOSIS:  left inguinal hernia, ventral hernias  POSTOP DIAGNOSIS:   Left indirect inguinal hernia, Ventral hernia x 3 (Swiss cheese - covered 6 x 8 cm area) - Incarcerated ventral hernia with omentum  PROCEDURE:   Procedure(s): OPEN LEFT INGUINAL HERNIA REPAIR WITH MESH, LAPAROSCOPIC VENTRAL HERNIA REPAIR WITH MESH [photos taken and part of the chart]  SURGEON:   Alphonsa Overall, M.D.  ASSISTANT:   None  ANESTHESIA:   general  Anesthesiologist: Roberts Gaudy, MD CRNA: Anne Fu, CRNA; Maxwell Caul, CRNA  General  EBL:  minimal  ml  BLOOD ADMINISTERED: none  DRAINS: none   LOCAL MEDICATIONS USED:   Left groin block by anesthesia, abdominal wall block with Exparel (20 cc) intraoop  SPECIMEN:   None  COUNTS CORRECT:  YES  INDICATIONS FOR PROCEDURE:  Donald Villegas is a 71 y.o. (DOB: 04-13-1946) white male whose primary care physician is Laurey Morale, MD and comes for repair of a left inguinal hernia and ventral hernia   The indications and risks of the surgery were explained to the patient.  The risks include, but are not limited to, infection, bleeding, and nerve injury.  OPERATIVE NOTE:  The patient was taken to OR room 5 at [facility].  He underwent a general anesthesia.  He was given 2 grams of Ancef at the beginning of the operation.   A time out was held and the surgical checklist run.   The abdomen was prepped with Cholroprep, the groin with Betadine, and sterilely draped.  I covered the abdomen with a Ioban drape.   I accessed the LUQ with a 5 mm trocar.  An additional 5 mm trocar was placed in the left mid abdomen and in the right mid abdomen.  I later upsized the LUQ trocar to an 11 mm trocar.   On abdominal exploration, he had at least 3 fascia defects in the upper abdomen, the largest about 3.0 cm.  It was hard to tell if these were related to his prior  laparoscopic prostate surgery.  There was some omentum incarcerated in 2 of the defects.  So this was more of a swiss cheese defect of the upper abdominal wall that involved about 6 x 8 cm..   Then I placed a 15 x 20 cm Parietex mesh in the abdomen.  It had 8 holding sutures of 0 Novafil.  These were tied down to secure the mesh to the anterior abdominal wall.  I then used 40 Securestrap tacks to tack the mesh to the anterior abdominal wall.  This covered the defects with at least a 5 cm overlap.   The abdomen was deflated.  The mesh was inspected and there were no defects around the edge.   I then started the left inguinal hernia.  A left inguinal incision was made through the subcutaneous fat to the external oblique fascia.   The external ring was opened and the cord structures encircled with a penrose drain.  The patient had an indirect hernia.  I identified the ileo inguinal nerve and preserved this during the dissection.  The patient had a hernia sac the came along the anterior medial portion of the cord structures.  The sac was dissected free of the cord structures.  The sac was opened and went to the peritoneal cavity.  There was no palpable mass within the peritoneal cavity.  The sac  was ligated with a 2-0 Vicryl suture.   The inguinal floor was repaired with a 3 x 6 inch piece of Ultrapro mesh.  The mesh was cut to fit the inguinal floor.  The mesh was sewn in place with interrupted 0 Novafil suture.  A key hole was made for the internal ring.  The mesh lay flat.  The inguinal floor was covered and the internal ring recreated.   The cord structures were returned to a normal location.  The external oblique was closed with a 3-0 vicryl.  The skin was closed with 4-0 monocryl and painted with LiquidBand.    I then relaparoscoped the patient to make sure there was no bleeding.  The trocars were then removed.  There was no bleeding at the trocar sites.  The incisions were closed with 4-0 Monocryl  and painted with DermaBond. The puncture sites were painted with DermaBond.   The sponge and needle count were correct.  The patient had an abdominal binder placed.  The patient was transferred to the recovery room in good condition.    Type of repair - Mesh  (choices - primary suture, mesh, or component)  Name of mesh - Parietex (for ventral hernia) and Ultrapro (for left inguinal hernia)  Size of mesh - Length 20 cm and Width 15 cm (for Parietex),   Length - 6 inches and width - 3 inches (for Ultrapro)  Mesh overlap - 5 cm  Placement of mesh - Parietex - beneath fascia and into peritoneal cavity   Ultrapro - above or external to fascia  (choices - beneath fascia and into peritoneal cavity, beneath fascia but external to peritoneal cavity, between the muscle and fascia, above or external to fascia)  Alphonsa Overall, MD, [facility] Surgery Pager: [phone] Office phone:  [phone]

## 2017-07-29 NOTE — Interval H&P Note (Signed)
History and Physical Interval Note:  07/29/2017 8:59 AM  Donald Villegas  has presented today for surgery, with the diagnosis of left inguinal hernia, ventral hernias  The various methods of treatment have been discussed with the patient and family.  His wife is with him.  After consideration of risks, benefits and other options for treatment, the patient has consented to  Procedure(s): OPEN LEFT INGUINAL [place] (Left) LAPAROSCOPIC VENTRAL HERNIA REPAIR (N/A) as a surgical intervention .  [name] history has been reviewed, patient examined, no change in status, stable for surgery.  I have reviewed the patient's chart and labs.  Questions were answered to the patient's satisfaction.     Monty Mccarrell H

## 2017-07-29 NOTE — Anesthesia Preprocedure Evaluation (Signed)
Anesthesia Evaluation  Patient identified by MRN, date of birth, ID band Patient awake    Reviewed: Allergy & Precautions, NPO status , Patient's Chart, lab work & pertinent test results  Airway Mallampati: II  TM Distance: >3 FB Neck ROM: Full    Dental  (+) Teeth Intact, Dental Advisory Given   Pulmonary former smoker,    breath sounds clear to auscultation       Cardiovascular  Rhythm:Regular Rate:Normal     Neuro/Psych    GI/Hepatic   Endo/Other    Renal/GU      Musculoskeletal   Abdominal   Peds  Hematology   Anesthesia Other Findings   Reproductive/Obstetrics                             Anesthesia Physical Anesthesia Plan  ASA: III  Anesthesia Plan: General   Post-op Pain Management:  Regional for Post-op pain   Induction:   PONV Risk Score and Plan: 1 and Ondansetron and Dexamethasone  Airway Management Planned: LMA  Additional Equipment:   Intra-op Plan:   Post-operative Plan:   Informed Consent: I have reviewed the patients History and Physical, chart, labs and discussed the procedure including the risks, benefits and alternatives for the proposed anesthesia with the patient or authorized representative who has indicated his/her understanding and acceptance.   Dental advisory given  Plan Discussed with: CRNA and Anesthesiologist  Anesthesia Plan Comments:         Anesthesia Quick Evaluation

## 2017-07-30 ENCOUNTER — Encounter (HOSPITAL_COMMUNITY): Payer: Self-pay | Admitting: Surgery

## 2017-07-30 DIAGNOSIS — K4090 Unilateral inguinal hernia, without obstruction or gangrene, not specified as recurrent: Secondary | ICD-10-CM | POA: Diagnosis not present

## 2017-07-30 NOTE — Progress Notes (Signed)
[place] [facility] Office:  [phone] General Surgery Progress Note   LOS: 1 day  POD -  1 Day Post-Op  Chief Complaint: Hernia  Assessment and Plan: 1.  OPEN LEFT INGUINAL HERNIA REPAIR WITH MESH, LAPAROSCOPIC VENTRAL HERNIA REPAIR WITH MESH  2.  ANTICOAGULATED ON COUMADIN (Z51.81)             On coumadin for recurrent PE        On hold for now 3.  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 Bilateral peroneal nerve neuropathy 5. History of PE 6. Prostate cancer Had robotic prostatectomy - 2010 - Dr. Ardath Sax in [facility] urology Increasing PSA - had salvage rad tx by dr. Valere Dross (April 2015) PSA up a little now again. His PSA on 07-09-15 was 0.27 but it has gone up to 0.58 as of 11-26-15. 7. History of a melanoma of the left arm - excised by Dr. Annamaria Boots Followed now by Dr. Ronnald Ramp 8.  DVT prophylaxis - SQ Heparin   Active Problems:   Incarcerated ventral hernia  Subjective:  Taking po's okay.  Diet advanced. Note:  He has no power at home.  Objective:   Vitals:   07/29/17 2214 07/30/17 0539  BP: 128/69 (!) 143/74  Pulse: 75 67  Resp: 18 18  Temp: 98.2 F (36.8 C) 98.2 F (36.8 C)  SpO2: 94% 95%     Intake/Output from previous day:  10/11 0701 - 10/12 0700 In: 3395 [P.O.:1320; I.V.:2075] Out: 2200 [Urine:2150; Blood:50]  Intake/Output this shift:  Total I/O In: 821.7 [I.V.:821.7] Out: -    Physical Exam:   General: WN older WM who is alert and oriented.    HEENT: Normal. Pupils equal. .   Lungs: Clear.  IS = 1,800 cc   Abdomen: Mild distention.  Has binder on.   Wound: Covered   Lab Results:   No results for input(s): WBC, HGB, HCT, PLT in the last 72 hours.  BMET  No results for input(s): NA, K, CL, CO2, GLUCOSE, BUN, CREATININE, CALCIUM in the last 72 hours.  PT/INR   Recent Labs  07/29/17 0726  LABPROT 16.7*  INR 1.37    ABG  No results for input(s): PHART, HCO3 in the last 72  hours.  Invalid input(s): PCO2, PO2   Studies/Results:  No results found.   Anti-infectives:   Anti-infectives    Start     Dose/Rate Route Frequency Ordered Stop   07/29/17 0725  ceFAZolin (ANCEF) IVPB 2g/100 mL premix     2 g 200 mL/hr over 30 Minutes Intravenous On call to O.R. 07/29/17 0725 07/29/17 0930      Alphonsa Overall, MD, FACS Pager: [place] [facility] Office: [phone] 07/30/2017

## 2017-07-31 DIAGNOSIS — K4090 Unilateral inguinal hernia, without obstruction or gangrene, not specified as recurrent: Secondary | ICD-10-CM | POA: Diagnosis not present

## 2017-07-31 MED ORDER — HYDROCODONE-ACETAMINOPHEN 5-325 MG PO TABS: 1.0000 | ORAL_TABLET | ORAL | 0 refills | Status: DC | PRN

## 2017-07-31 NOTE — Discharge Instructions (Signed)
[facility] [place] [facility] - DISCHARGE INSTRUCTIONS TO PATIENT  Activity:  Driving - May drive in 2 or 3 days, if doing well   Lifting - No lifting more than 15 pounds for 1 month, then no limit  Wound Care:   May shower Saturday  Diet:  As tolerated.  Drink plenty of fluid  Follow up appointment:  Call Dr. Pollie Friar office [facility]) at [phone] for an appointment in 2 to 3 weeks.  Medications and dosages:  Resume your home medications.  You have a prescription for:  Vicodin  Call Dr. Lucia Gaskins or his office  [phone]) if you have:  Temperature greater than 100.4,  Persistent nausea and vomiting,  Severe uncontrolled pain,  Redness, tenderness, or signs of infection (pain, swelling, redness, odor or green/yellow discharge around the site),  Difficulty breathing, headache or visual disturbances,  Any other questions or concerns you may have after discharge.  In an emergency, call 911 or go to an Emergency Department at a nearby hospital.

## 2017-07-31 NOTE — Progress Notes (Signed)
Discharge and medication instructions reviewed with patient. Questions answered. Patient denies further questions. One prescription given to patient. Family member coming to drive patient home. Donne Hazel, RN

## 2017-07-31 NOTE — Discharge Summary (Signed)
Physician Discharge Summary  Patient ID: Donald Villegas MRN: [id number] DOB/AGE: [birth year] 73 y.o.  Admit date: 07/29/2017 Discharge date: 07/31/2017  Admission Diagnoses: Incarcerated ventral hernia  Discharge Diagnoses:  Active Problems:   Incarcerated ventral hernia   Discharged Condition: good  Hospital Course: Pt admitted after surgery.  His diet was advanced as tolerated.  He was discharged to home when tolerating a diet and his pain was controlled with PO meds.  Consults: None  Significant Diagnostic Studies: labs: INR, CBC  Treatments: IV hydration, analgesia: Vicodin and surgery: VHR, inguinal hernia repair  Discharge Exam: Blood pressure 116/70, pulse (!) 59, temperature 98.5 F (36.9 C), temperature source Oral, resp. rate 17, height 5\' 10"  (1.778 m), weight 82.6 kg (182 lb), SpO2 95 %. General appearance: alert and cooperative GI: normal findings: soft, non-tender Incision/Wound: clean, dry, intact  Disposition: 01-Home or Self Care   Allergies as of 07/31/2017   No Known Allergies     Medication List    TAKE these medications   famotidine 20 MG tablet Commonly known as:  PEPCID Take 10 mg by mouth 2 (two) times daily.   HYDROcodone-acetaminophen 5-325 MG tablet Commonly known as:  NORCO/VICODIN Take 1-2 tablets by mouth every 4 (four) hours as needed for moderate pain.   predniSONE 20 MG tablet Commonly known as:  DELTASONE Take 5-10 mg by mouth daily as needed (for rash).   sildenafil 100 MG tablet Commonly known as:  VIAGRA Take 0.5-1 tablets (50-100 mg total) by mouth daily as needed for erectile dysfunction.   tadalafil 20 MG tablet Commonly known as:  CIALIS Take 0.5-1 tablets (10-20 mg total) by mouth every other day as needed for erectile dysfunction.   warfarin 5 MG tablet Commonly known as:  COUMADIN TAKE AS DIRECTED BY COUMADIN CLINIC What changed:  how much to take  how to take this  when to take this  additional  instructions      Follow-up Information    Alphonsa Overall, MD. Schedule an appointment as soon as possible for a visit in 2 week(s).   Specialty:  General Surgery Contact information: [address] [place] [address] [phone]           Signed: Rosario Adie 73/99/3716, 7:58 AM

## 2017-07-31 NOTE — Plan of Care (Signed)
Problem: Safety: Goal: Ability to remain free from injury will improve Outcome: Progressing Patient is aware of safety precautions  Problem: Pain Managment: Goal: General experience of comfort will improve Outcome: Progressing Medicated once for pain this shift, will continue to monitor  Problem: Physical Regulation: Goal: Will remain free from infection Outcome: Progressing No S/S of infection, VS WNL  Problem: Tissue Perfusion: Goal: Risk factors for ineffective tissue perfusion will decrease Outcome: Progressing SCDs on, on Heparin SQ  Problem: Activity: Goal: Risk for activity intolerance will decrease Outcome: Progressing Ambulates independently and tolerates well  Problem: Bowel/Gastric: Goal: Will not experience complications related to bowel motility Outcome: Progressing No gastric or bowel complications reported

## 2017-07-31 NOTE — Care Management Obs Status (Signed)
[facility] NOTIFICATION   Patient Details  Name: Donald Villegas MRN: [id number] Date of Birth: 06-Aug-1946   Medicare Observation Status Notification Given:  Yes    Erenest Rasher, RN 07/31/2017, 12:05 PM

## 2017-08-09 ENCOUNTER — Ambulatory Visit (INDEPENDENT_AMBULATORY_CARE_PROVIDER_SITE_OTHER): Payer: PPO | Admitting: General Practice

## 2017-08-09 DIAGNOSIS — Z7901 Long term (current) use of anticoagulants: Secondary | ICD-10-CM | POA: Diagnosis not present

## 2017-08-09 LAB — POCT INR: INR: 2.7

## 2017-08-09 NOTE — Patient Instructions (Signed)
Pre visit review using our clinic review tool, if applicable. No additional management support is needed unless otherwise documented below in the visit note. 

## 2017-08-19 DIAGNOSIS — Z85820 Personal history of malignant melanoma of skin: Secondary | ICD-10-CM | POA: Diagnosis not present

## 2017-08-19 DIAGNOSIS — Z85828 Personal history of other malignant neoplasm of skin: Secondary | ICD-10-CM | POA: Diagnosis not present

## 2017-08-19 DIAGNOSIS — C44629 Squamous cell carcinoma of skin of left upper limb, including shoulder: Secondary | ICD-10-CM | POA: Diagnosis not present

## 2017-10-01 DIAGNOSIS — H1031 Unspecified acute conjunctivitis, right eye: Secondary | ICD-10-CM | POA: Diagnosis not present

## 2017-10-04 ENCOUNTER — Ambulatory Visit: Payer: PPO | Admitting: General Practice

## 2017-10-04 DIAGNOSIS — Z7901 Long term (current) use of anticoagulants: Secondary | ICD-10-CM

## 2017-10-04 LAB — POCT INR: INR: 1.6

## 2017-10-04 NOTE — Patient Instructions (Addendum)
Pre visit review using our clinic review tool, if applicable. No additional management support is needed unless otherwise documented below in the visit note.  Take 1 1/2 tablets today and tomorrow (12/17 and 12/18) and then continue to take 1 pill (5mg ) daily EXCEPT for 1/2 pill on Sundays and Wednesdays.  Recheck in 4 weeks.

## 2017-11-01 ENCOUNTER — Ambulatory Visit (INDEPENDENT_AMBULATORY_CARE_PROVIDER_SITE_OTHER): Payer: PPO | Admitting: General Practice

## 2017-11-01 DIAGNOSIS — I2699 Other pulmonary embolism without acute cor pulmonale: Secondary | ICD-10-CM | POA: Diagnosis not present

## 2017-11-01 DIAGNOSIS — Z7901 Long term (current) use of anticoagulants: Secondary | ICD-10-CM

## 2017-11-01 LAB — POCT INR: INR: 2

## 2017-11-01 NOTE — Patient Instructions (Addendum)
Pre visit review using our clinic review tool, if applicable. No additional management support is needed unless otherwise documented below in the visit note.  Continue to take 1 pill (5mg) daily EXCEPT for 1/2 pill on Sundays and Wednesdays.  Recheck in 4 weeks. 

## 2017-11-29 ENCOUNTER — Ambulatory Visit (INDEPENDENT_AMBULATORY_CARE_PROVIDER_SITE_OTHER): Payer: PPO | Admitting: General Practice

## 2017-11-29 DIAGNOSIS — Z7901 Long term (current) use of anticoagulants: Secondary | ICD-10-CM

## 2017-11-29 LAB — POCT INR: INR: 2.4

## 2017-11-29 NOTE — Patient Instructions (Addendum)
Pre visit review using our clinic review tool, if applicable. No additional management support is needed unless otherwise documented below in the visit note.  Continue to take 1 pill (5mg) daily EXCEPT for 1/2 pill on Sundays and Wednesdays.  Recheck in 8 weeks at patient request.   

## 2017-12-23 DIAGNOSIS — L570 Actinic keratosis: Secondary | ICD-10-CM | POA: Diagnosis not present

## 2017-12-23 DIAGNOSIS — C44519 Basal cell carcinoma of skin of other part of trunk: Secondary | ICD-10-CM | POA: Diagnosis not present

## 2017-12-23 DIAGNOSIS — Z85820 Personal history of malignant melanoma of skin: Secondary | ICD-10-CM | POA: Diagnosis not present

## 2017-12-23 DIAGNOSIS — D485 Neoplasm of uncertain behavior of skin: Secondary | ICD-10-CM | POA: Diagnosis not present

## 2017-12-23 DIAGNOSIS — D2362 Other benign neoplasm of skin of left upper limb, including shoulder: Secondary | ICD-10-CM | POA: Diagnosis not present

## 2017-12-23 DIAGNOSIS — L821 Other seborrheic keratosis: Secondary | ICD-10-CM | POA: Diagnosis not present

## 2017-12-23 DIAGNOSIS — Z85828 Personal history of other malignant neoplasm of skin: Secondary | ICD-10-CM | POA: Diagnosis not present

## 2018-01-03 DIAGNOSIS — H04123 Dry eye syndrome of bilateral lacrimal glands: Secondary | ICD-10-CM | POA: Diagnosis not present

## 2018-01-24 ENCOUNTER — Ambulatory Visit (INDEPENDENT_AMBULATORY_CARE_PROVIDER_SITE_OTHER): Payer: PPO | Admitting: General Practice

## 2018-01-24 DIAGNOSIS — Z7901 Long term (current) use of anticoagulants: Secondary | ICD-10-CM | POA: Diagnosis not present

## 2018-01-24 LAB — POCT INR: INR: 2.6

## 2018-01-24 NOTE — Patient Instructions (Addendum)
Pre visit review using our clinic review tool, if applicable. No additional management support is needed unless otherwise documented below in the visit note.  Continue to take 1 pill (5mg) daily EXCEPT for 1/2 pill on Sundays and Wednesdays.  Recheck in 8 weeks at patient request.   

## 2018-02-07 DIAGNOSIS — H04123 Dry eye syndrome of bilateral lacrimal glands: Secondary | ICD-10-CM | POA: Diagnosis not present

## 2018-02-11 DIAGNOSIS — Z923 Personal history of irradiation: Secondary | ICD-10-CM | POA: Diagnosis not present

## 2018-02-11 DIAGNOSIS — Z79899 Other long term (current) drug therapy: Secondary | ICD-10-CM | POA: Diagnosis not present

## 2018-02-11 DIAGNOSIS — Z7952 Long term (current) use of systemic steroids: Secondary | ICD-10-CM | POA: Diagnosis not present

## 2018-02-11 DIAGNOSIS — Z7901 Long term (current) use of anticoagulants: Secondary | ICD-10-CM | POA: Diagnosis not present

## 2018-02-11 DIAGNOSIS — Z7189 Other specified counseling: Secondary | ICD-10-CM | POA: Diagnosis not present

## 2018-02-11 DIAGNOSIS — C61 Malignant neoplasm of prostate: Secondary | ICD-10-CM | POA: Diagnosis not present

## 2018-02-11 DIAGNOSIS — Z9079 Acquired absence of other genital organ(s): Secondary | ICD-10-CM | POA: Diagnosis not present

## 2018-02-11 DIAGNOSIS — N5231 Erectile dysfunction following radical prostatectomy: Secondary | ICD-10-CM | POA: Diagnosis not present

## 2018-02-11 DIAGNOSIS — N529 Male erectile dysfunction, unspecified: Secondary | ICD-10-CM | POA: Diagnosis not present

## 2018-02-23 ENCOUNTER — Other Ambulatory Visit: Payer: Self-pay | Admitting: Family Medicine

## 2018-02-23 NOTE — Telephone Encounter (Signed)
Last OV 12/02/2016   Last refilled 12/02/2016 disp 30 with 11 refills  Sent to Body to advise

## 2018-03-21 ENCOUNTER — Ambulatory Visit (INDEPENDENT_AMBULATORY_CARE_PROVIDER_SITE_OTHER): Payer: PPO | Admitting: General Practice

## 2018-03-21 DIAGNOSIS — Z7901 Long term (current) use of anticoagulants: Secondary | ICD-10-CM | POA: Diagnosis not present

## 2018-03-21 LAB — POCT INR: INR: 1.8 — AB (ref 2.0–3.0)

## 2018-03-21 NOTE — Patient Instructions (Addendum)
Pre visit review using our clinic review tool, if applicable. No additional management support is needed unless otherwise documented below in the visit note.  Continue to take 1 pill (5mg) daily EXCEPT for 1/2 pill on Sundays and Wednesdays.  Recheck in 8 weeks at patient request.   

## 2018-03-25 DIAGNOSIS — C61 Malignant neoplasm of prostate: Secondary | ICD-10-CM | POA: Diagnosis not present

## 2018-04-01 DIAGNOSIS — H401131 Primary open-angle glaucoma, bilateral, mild stage: Secondary | ICD-10-CM | POA: Diagnosis not present

## 2018-04-01 DIAGNOSIS — H5203 Hypermetropia, bilateral: Secondary | ICD-10-CM | POA: Diagnosis not present

## 2018-04-04 DIAGNOSIS — R9389 Abnormal findings on diagnostic imaging of other specified body structures: Secondary | ICD-10-CM | POA: Diagnosis not present

## 2018-04-04 DIAGNOSIS — C61 Malignant neoplasm of prostate: Secondary | ICD-10-CM | POA: Diagnosis not present

## 2018-04-15 DIAGNOSIS — C61 Malignant neoplasm of prostate: Secondary | ICD-10-CM | POA: Diagnosis not present

## 2018-05-16 ENCOUNTER — Ambulatory Visit (INDEPENDENT_AMBULATORY_CARE_PROVIDER_SITE_OTHER): Payer: PPO | Admitting: General Practice

## 2018-05-16 DIAGNOSIS — Z7901 Long term (current) use of anticoagulants: Secondary | ICD-10-CM | POA: Diagnosis not present

## 2018-05-16 LAB — POCT INR: INR: 2.2 (ref 2.0–3.0)

## 2018-05-16 NOTE — Patient Instructions (Addendum)
Pre visit review using our clinic review tool, if applicable. No additional management support is needed unless otherwise documented below in the visit note.  Continue to take 1 pill (5mg) daily EXCEPT for 1/2 pill on Sundays and Wednesdays.  Recheck in 8 weeks at patient request.   

## 2018-06-28 DIAGNOSIS — Z85828 Personal history of other malignant neoplasm of skin: Secondary | ICD-10-CM | POA: Diagnosis not present

## 2018-06-28 DIAGNOSIS — L570 Actinic keratosis: Secondary | ICD-10-CM | POA: Diagnosis not present

## 2018-06-28 DIAGNOSIS — D225 Melanocytic nevi of trunk: Secondary | ICD-10-CM | POA: Diagnosis not present

## 2018-06-28 DIAGNOSIS — L821 Other seborrheic keratosis: Secondary | ICD-10-CM | POA: Diagnosis not present

## 2018-06-28 DIAGNOSIS — Z85820 Personal history of malignant melanoma of skin: Secondary | ICD-10-CM | POA: Diagnosis not present

## 2018-07-11 ENCOUNTER — Ambulatory Visit: Payer: PPO | Admitting: General Practice

## 2018-07-11 DIAGNOSIS — Z7901 Long term (current) use of anticoagulants: Secondary | ICD-10-CM

## 2018-07-11 DIAGNOSIS — C61 Malignant neoplasm of prostate: Secondary | ICD-10-CM | POA: Diagnosis not present

## 2018-07-11 LAB — POCT INR: INR: 3.2 — AB (ref 2.0–3.0)

## 2018-07-11 NOTE — Patient Instructions (Signed)
Pre visit review using our clinic review tool, if applicable. No additional management support is needed unless otherwise documented below in the visit note.  Skip coumadin today and then continue to take 1 pill (5mg ) daily EXCEPT for 1/2 pill on Sundays and Wednesdays.  Recheck in 8 weeks at patient request.

## 2018-07-15 DIAGNOSIS — C61 Malignant neoplasm of prostate: Secondary | ICD-10-CM | POA: Diagnosis not present

## 2018-07-28 DIAGNOSIS — C61 Malignant neoplasm of prostate: Secondary | ICD-10-CM | POA: Diagnosis not present

## 2018-07-29 DIAGNOSIS — H0012 Chalazion right lower eyelid: Secondary | ICD-10-CM | POA: Diagnosis not present

## 2018-07-29 DIAGNOSIS — H1031 Unspecified acute conjunctivitis, right eye: Secondary | ICD-10-CM | POA: Diagnosis not present

## 2018-07-29 DIAGNOSIS — H01002 Unspecified blepharitis right lower eyelid: Secondary | ICD-10-CM | POA: Diagnosis not present

## 2018-07-29 DIAGNOSIS — H0019 Chalazion unspecified eye, unspecified eyelid: Secondary | ICD-10-CM | POA: Diagnosis not present

## 2018-07-29 DIAGNOSIS — H1030 Unspecified acute conjunctivitis, unspecified eye: Secondary | ICD-10-CM | POA: Diagnosis not present

## 2018-07-29 DIAGNOSIS — H01009 Unspecified blepharitis unspecified eye, unspecified eyelid: Secondary | ICD-10-CM | POA: Diagnosis not present

## 2018-07-29 DIAGNOSIS — H01001 Unspecified blepharitis right upper eyelid: Secondary | ICD-10-CM | POA: Diagnosis not present

## 2018-08-12 DIAGNOSIS — H40053 Ocular hypertension, bilateral: Secondary | ICD-10-CM | POA: Diagnosis not present

## 2018-08-12 DIAGNOSIS — H0012 Chalazion right lower eyelid: Secondary | ICD-10-CM | POA: Diagnosis not present

## 2018-08-12 DIAGNOSIS — H0100A Unspecified blepharitis right eye, upper and lower eyelids: Secondary | ICD-10-CM | POA: Diagnosis not present

## 2018-08-12 DIAGNOSIS — H1031 Unspecified acute conjunctivitis, right eye: Secondary | ICD-10-CM | POA: Diagnosis not present

## 2018-08-29 DIAGNOSIS — C61 Malignant neoplasm of prostate: Secondary | ICD-10-CM | POA: Diagnosis not present

## 2018-08-29 DIAGNOSIS — C775 Secondary and unspecified malignant neoplasm of intrapelvic lymph nodes: Secondary | ICD-10-CM | POA: Diagnosis not present

## 2018-08-29 DIAGNOSIS — Z87891 Personal history of nicotine dependence: Secondary | ICD-10-CM | POA: Diagnosis not present

## 2018-09-05 ENCOUNTER — Ambulatory Visit (INDEPENDENT_AMBULATORY_CARE_PROVIDER_SITE_OTHER): Payer: PPO | Admitting: General Practice

## 2018-09-05 DIAGNOSIS — Z7901 Long term (current) use of anticoagulants: Secondary | ICD-10-CM

## 2018-09-05 LAB — POCT INR: INR: 1.6 — AB (ref 2.0–3.0)

## 2018-09-05 NOTE — Patient Instructions (Addendum)
Pre visit review using our clinic review tool, if applicable. No additional management support is needed unless otherwise documented below in the visit note.  Take 1 1/2 tablets today and tomorrow (11/18 and 11/19) and then continue to take 1 pill (5mg ) daily EXCEPT for 1/2 pill on Sundays and Wednesdays.  Recheck in 8 weeks at patient request.

## 2018-09-26 ENCOUNTER — Other Ambulatory Visit: Payer: Self-pay | Admitting: Family Medicine

## 2018-10-03 DIAGNOSIS — H1033 Unspecified acute conjunctivitis, bilateral: Secondary | ICD-10-CM | POA: Diagnosis not present

## 2018-10-20 DIAGNOSIS — H1033 Unspecified acute conjunctivitis, bilateral: Secondary | ICD-10-CM | POA: Diagnosis not present

## 2018-10-24 DIAGNOSIS — C775 Secondary and unspecified malignant neoplasm of intrapelvic lymph nodes: Secondary | ICD-10-CM | POA: Diagnosis not present

## 2018-10-24 DIAGNOSIS — C61 Malignant neoplasm of prostate: Secondary | ICD-10-CM | POA: Diagnosis not present

## 2018-10-31 ENCOUNTER — Ambulatory Visit (INDEPENDENT_AMBULATORY_CARE_PROVIDER_SITE_OTHER): Payer: PPO | Admitting: General Practice

## 2018-10-31 DIAGNOSIS — Z7901 Long term (current) use of anticoagulants: Secondary | ICD-10-CM | POA: Diagnosis not present

## 2018-10-31 LAB — POCT INR: INR: 2.5 (ref 2.0–3.0)

## 2018-10-31 NOTE — Patient Instructions (Addendum)
Pre visit review using our clinic review tool, if applicable. No additional management support is needed unless otherwise documented below in the visit note.  Continue to take 1 pill (5mg) daily EXCEPT for 1/2 pill on Sundays and Wednesdays.  Recheck in 8 weeks at patient request.   

## 2018-11-04 DIAGNOSIS — H2011 Chronic iridocyclitis, right eye: Secondary | ICD-10-CM | POA: Diagnosis not present

## 2018-11-25 DIAGNOSIS — H0011 Chalazion right upper eyelid: Secondary | ICD-10-CM | POA: Diagnosis not present

## 2018-11-28 DIAGNOSIS — R918 Other nonspecific abnormal finding of lung field: Secondary | ICD-10-CM | POA: Diagnosis not present

## 2018-11-28 DIAGNOSIS — Z87891 Personal history of nicotine dependence: Secondary | ICD-10-CM | POA: Diagnosis not present

## 2018-11-28 DIAGNOSIS — C775 Secondary and unspecified malignant neoplasm of intrapelvic lymph nodes: Secondary | ICD-10-CM | POA: Diagnosis not present

## 2018-11-28 DIAGNOSIS — C61 Malignant neoplasm of prostate: Secondary | ICD-10-CM | POA: Diagnosis not present

## 2018-12-02 DIAGNOSIS — H10401 Unspecified chronic conjunctivitis, right eye: Secondary | ICD-10-CM | POA: Diagnosis not present

## 2018-12-09 DIAGNOSIS — H10401 Unspecified chronic conjunctivitis, right eye: Secondary | ICD-10-CM | POA: Diagnosis not present

## 2018-12-21 DIAGNOSIS — L821 Other seborrheic keratosis: Secondary | ICD-10-CM | POA: Diagnosis not present

## 2018-12-21 DIAGNOSIS — Z85820 Personal history of malignant melanoma of skin: Secondary | ICD-10-CM | POA: Diagnosis not present

## 2018-12-21 DIAGNOSIS — D225 Melanocytic nevi of trunk: Secondary | ICD-10-CM | POA: Diagnosis not present

## 2018-12-21 DIAGNOSIS — Z85828 Personal history of other malignant neoplasm of skin: Secondary | ICD-10-CM | POA: Diagnosis not present

## 2018-12-21 DIAGNOSIS — L820 Inflamed seborrheic keratosis: Secondary | ICD-10-CM | POA: Diagnosis not present

## 2018-12-26 ENCOUNTER — Ambulatory Visit (INDEPENDENT_AMBULATORY_CARE_PROVIDER_SITE_OTHER): Payer: PPO | Admitting: General Practice

## 2018-12-26 DIAGNOSIS — Z7901 Long term (current) use of anticoagulants: Secondary | ICD-10-CM | POA: Diagnosis not present

## 2018-12-26 LAB — POCT INR: INR: 2.2 (ref 2.0–3.0)

## 2018-12-26 NOTE — Patient Instructions (Addendum)
Pre visit review using our clinic review tool, if applicable. No additional management support is needed unless otherwise documented below in the visit note.  Continue to take 1 pill (5mg) daily EXCEPT for 1/2 pill on Sundays and Wednesdays.  Recheck in 8 weeks at patient request.   

## 2019-01-03 DIAGNOSIS — H10401 Unspecified chronic conjunctivitis, right eye: Secondary | ICD-10-CM | POA: Diagnosis not present

## 2019-01-25 ENCOUNTER — Encounter: Payer: Self-pay | Admitting: Podiatry

## 2019-01-25 ENCOUNTER — Other Ambulatory Visit: Payer: Self-pay | Admitting: Podiatry

## 2019-01-25 ENCOUNTER — Ambulatory Visit: Payer: PPO | Admitting: Podiatry

## 2019-01-25 ENCOUNTER — Other Ambulatory Visit: Payer: Self-pay

## 2019-01-25 ENCOUNTER — Ambulatory Visit (INDEPENDENT_AMBULATORY_CARE_PROVIDER_SITE_OTHER): Payer: PPO

## 2019-01-25 VITALS — BP 165/93 | HR 63 | Temp 97.7°F | Resp 16

## 2019-01-25 DIAGNOSIS — M79672 Pain in left foot: Secondary | ICD-10-CM

## 2019-01-25 DIAGNOSIS — M722 Plantar fascial fibromatosis: Secondary | ICD-10-CM | POA: Diagnosis not present

## 2019-01-25 DIAGNOSIS — M545 Low back pain: Secondary | ICD-10-CM | POA: Diagnosis not present

## 2019-01-25 DIAGNOSIS — G8929 Other chronic pain: Secondary | ICD-10-CM | POA: Diagnosis not present

## 2019-01-25 MED ORDER — TRIAMCINOLONE ACETONIDE 10 MG/ML IJ SUSP
10.0000 mg | Freq: Once | INTRAMUSCULAR | Status: AC
  Administered 2019-01-25: 16:00:00 10 mg

## 2019-01-25 NOTE — Progress Notes (Signed)
   Subjective:    Patient ID: Donald Villegas, male    DOB: 1946/02/21, 73 y.o.   MRN: [id number]  HPI    Review of Systems  All other systems reviewed and are negative.      Objective:   Physical Exam        Assessment & Plan:

## 2019-01-25 NOTE — Patient Instructions (Signed)

## 2019-01-26 NOTE — Progress Notes (Signed)
Subjective:   Patient ID: Donald Villegas, male   DOB: 73 y.o.   MRN: [id number]   HPI Patient presents stating he is developed intense discomfort in the bottom of his left heel and he was fishing and on his foot a lot and then it seemed to occur after that.  Patient states that he has been limping somewhat and patient does not smoke and likes to be active   Review of Systems  All other systems reviewed and are negative.       Objective:  Physical Exam Vitals signs and nursing note reviewed.  Constitutional:      Appearance: He is well-developed.  Pulmonary:     Effort: Pulmonary effort is normal.  Musculoskeletal: Normal range of motion.  Skin:    General: Skin is warm.  Neurological:     Mental Status: He is alert.     Neurovascular status intact muscle strength was found to be within normal limits with patient found to have quite a bit of discomfort in the plantar heel left at the insertional point of the tendon into the calcaneus with inflammation fluid around the medial band.  Patient is noted to have good digital perfusion is well oriented x3 with no depression of the arch noted currently.       Assessment:  Acute plantar fasciitis left with inflammation fluid of the medial band     Plan:  H&P condition reviewed and at this point I injected the plantar fascial left 3 mg Kenalog 5 mg Xylocaine reviewed reduced activity and if symptoms were to persist or get worse patient is to reappoint immediately.  Patient was encouraged to utilize shoes with high arch and heel lift and is encouraged to call me with any questions  X-ray indicates small plantar spur with no indications of stress fracture arthritis

## 2019-02-20 ENCOUNTER — Ambulatory Visit (INDEPENDENT_AMBULATORY_CARE_PROVIDER_SITE_OTHER): Payer: PPO | Admitting: General Practice

## 2019-02-20 ENCOUNTER — Other Ambulatory Visit: Payer: Self-pay

## 2019-02-20 DIAGNOSIS — Z7901 Long term (current) use of anticoagulants: Secondary | ICD-10-CM | POA: Diagnosis not present

## 2019-02-20 LAB — POCT INR: INR: 2.8 (ref 2.0–3.0)

## 2019-02-20 NOTE — Patient Instructions (Addendum)
Pre visit review using our clinic review tool, if applicable. No additional management support is needed unless otherwise documented below in the visit note.  Continue to take 1 pill (5mg ) daily EXCEPT for 1/2 pill on Sundays and Wednesdays.  Recheck in 8 weeks at patient request.

## 2019-03-01 DIAGNOSIS — M722 Plantar fascial fibromatosis: Secondary | ICD-10-CM | POA: Diagnosis not present

## 2019-03-01 DIAGNOSIS — R5383 Other fatigue: Secondary | ICD-10-CM | POA: Diagnosis not present

## 2019-03-01 DIAGNOSIS — Z79818 Long term (current) use of other agents affecting estrogen receptors and estrogen levels: Secondary | ICD-10-CM | POA: Diagnosis not present

## 2019-03-01 DIAGNOSIS — C775 Secondary and unspecified malignant neoplasm of intrapelvic lymph nodes: Secondary | ICD-10-CM | POA: Diagnosis not present

## 2019-03-01 DIAGNOSIS — Z87891 Personal history of nicotine dependence: Secondary | ICD-10-CM | POA: Diagnosis not present

## 2019-03-01 DIAGNOSIS — Z923 Personal history of irradiation: Secondary | ICD-10-CM | POA: Diagnosis not present

## 2019-03-01 DIAGNOSIS — M2560 Stiffness of unspecified joint, not elsewhere classified: Secondary | ICD-10-CM | POA: Diagnosis not present

## 2019-03-01 DIAGNOSIS — C61 Malignant neoplasm of prostate: Secondary | ICD-10-CM | POA: Diagnosis not present

## 2019-03-01 DIAGNOSIS — G629 Polyneuropathy, unspecified: Secondary | ICD-10-CM | POA: Diagnosis not present

## 2019-03-01 DIAGNOSIS — Z9079 Acquired absence of other genital organ(s): Secondary | ICD-10-CM | POA: Diagnosis not present

## 2019-03-06 ENCOUNTER — Other Ambulatory Visit: Payer: Self-pay

## 2019-03-06 ENCOUNTER — Encounter: Payer: Self-pay | Admitting: Podiatry

## 2019-03-06 ENCOUNTER — Ambulatory Visit (INDEPENDENT_AMBULATORY_CARE_PROVIDER_SITE_OTHER): Payer: PPO

## 2019-03-06 ENCOUNTER — Ambulatory Visit (INDEPENDENT_AMBULATORY_CARE_PROVIDER_SITE_OTHER): Payer: PPO | Admitting: Podiatry

## 2019-03-06 ENCOUNTER — Other Ambulatory Visit: Payer: Self-pay | Admitting: Podiatry

## 2019-03-06 VITALS — Temp 97.7°F

## 2019-03-06 DIAGNOSIS — M2041 Other hammer toe(s) (acquired), right foot: Secondary | ICD-10-CM

## 2019-03-06 DIAGNOSIS — M722 Plantar fascial fibromatosis: Secondary | ICD-10-CM

## 2019-03-06 MED ORDER — TRIAMCINOLONE ACETONIDE 10 MG/ML IJ SUSP
10.0000 mg | Freq: Once | INTRAMUSCULAR | Status: AC
  Administered 2019-03-06: 10 mg

## 2019-03-08 ENCOUNTER — Ambulatory Visit: Payer: PPO | Admitting: Podiatry

## 2019-03-08 NOTE — Progress Notes (Signed)
Subjective:   Patient ID: Donald Villegas, male   DOB: 73 y.o.   MRN: [id number]   HPI Patient presents stating he was doing fine and then it came back a few days ago and is been very sore in the bottom of the heel   ROS      Objective:  Physical Exam  Neurovascular status intact with discomfort plantar heel left that improved with pain still present upon deep palpation but there is a lot of inflammation within the heel itself and slightly in the body the calcaneus     Assessment:  Continued acute plantar fasciitis left which continues to get very sore with any form of ambulation     Plan:  H&P condition reviewed and recommended at this time due to the intensity of discomfort and the fact that it is swollen that we go ahead and immobilize with air fracture walker with slight chance for stress fracture.  I did inject the plantar fascial 1 more time 3 mg Kenalog 5 mm Sokun distal to its insertion calcaneus and advised on ice therapy and patient will be seen back 3 or 4 weeks or earlier if needed

## 2019-03-14 DIAGNOSIS — H40053 Ocular hypertension, bilateral: Secondary | ICD-10-CM | POA: Diagnosis not present

## 2019-03-14 DIAGNOSIS — H10401 Unspecified chronic conjunctivitis, right eye: Secondary | ICD-10-CM | POA: Diagnosis not present

## 2019-04-12 DIAGNOSIS — L821 Other seborrheic keratosis: Secondary | ICD-10-CM | POA: Diagnosis not present

## 2019-04-12 DIAGNOSIS — Z85828 Personal history of other malignant neoplasm of skin: Secondary | ICD-10-CM | POA: Diagnosis not present

## 2019-04-12 DIAGNOSIS — D485 Neoplasm of uncertain behavior of skin: Secondary | ICD-10-CM | POA: Diagnosis not present

## 2019-04-12 DIAGNOSIS — C44722 Squamous cell carcinoma of skin of right lower limb, including hip: Secondary | ICD-10-CM | POA: Diagnosis not present

## 2019-04-12 DIAGNOSIS — Z85820 Personal history of malignant melanoma of skin: Secondary | ICD-10-CM | POA: Diagnosis not present

## 2019-04-12 DIAGNOSIS — L309 Dermatitis, unspecified: Secondary | ICD-10-CM | POA: Diagnosis not present

## 2019-04-12 DIAGNOSIS — L814 Other melanin hyperpigmentation: Secondary | ICD-10-CM | POA: Diagnosis not present

## 2019-04-12 DIAGNOSIS — L820 Inflamed seborrheic keratosis: Secondary | ICD-10-CM | POA: Diagnosis not present

## 2019-04-12 DIAGNOSIS — L570 Actinic keratosis: Secondary | ICD-10-CM | POA: Diagnosis not present

## 2019-04-12 DIAGNOSIS — D225 Melanocytic nevi of trunk: Secondary | ICD-10-CM | POA: Diagnosis not present

## 2019-04-17 ENCOUNTER — Other Ambulatory Visit: Payer: Self-pay

## 2019-04-17 ENCOUNTER — Ambulatory Visit (INDEPENDENT_AMBULATORY_CARE_PROVIDER_SITE_OTHER): Payer: PPO | Admitting: General Practice

## 2019-04-17 DIAGNOSIS — Z7901 Long term (current) use of anticoagulants: Secondary | ICD-10-CM | POA: Diagnosis not present

## 2019-04-17 LAB — POCT INR: INR: 2.5 (ref 2.0–3.0)

## 2019-04-17 NOTE — Patient Instructions (Signed)
Pre visit review using our clinic review tool, if applicable. No additional management support is needed unless otherwise documented below in the visit note.  Continue to take 1 pill (5mg ) daily EXCEPT for 1/2 pill on Sundays and Wednesdays.  Recheck in 8 weeks at patient request.

## 2019-05-04 ENCOUNTER — Other Ambulatory Visit: Payer: Self-pay | Admitting: Family Medicine

## 2019-05-04 DIAGNOSIS — Z7901 Long term (current) use of anticoagulants: Secondary | ICD-10-CM

## 2019-06-12 ENCOUNTER — Other Ambulatory Visit: Payer: Self-pay

## 2019-06-12 ENCOUNTER — Ambulatory Visit (INDEPENDENT_AMBULATORY_CARE_PROVIDER_SITE_OTHER): Payer: PPO | Admitting: General Practice

## 2019-06-12 DIAGNOSIS — Z7901 Long term (current) use of anticoagulants: Secondary | ICD-10-CM

## 2019-06-12 LAB — POCT INR: INR: 3.5 — AB (ref 2.0–3.0)

## 2019-06-12 NOTE — Patient Instructions (Addendum)
Pre visit review using our clinic review tool, if applicable. No additional management support is needed unless otherwise documented below in the visit note.  Continue to take 1 pill (5mg ) daily EXCEPT for 1/2 pill on Sundays and Wednesdays.  Recheck in 4 weeks.

## 2019-06-15 DIAGNOSIS — H10401 Unspecified chronic conjunctivitis, right eye: Secondary | ICD-10-CM | POA: Diagnosis not present

## 2019-06-15 DIAGNOSIS — H40053 Ocular hypertension, bilateral: Secondary | ICD-10-CM | POA: Diagnosis not present

## 2019-06-20 ENCOUNTER — Encounter

## 2019-06-21 ENCOUNTER — Encounter: Payer: Self-pay | Admitting: Podiatry

## 2019-06-21 ENCOUNTER — Other Ambulatory Visit: Payer: Self-pay

## 2019-06-21 ENCOUNTER — Ambulatory Visit: Payer: PPO | Admitting: Podiatry

## 2019-06-21 DIAGNOSIS — M779 Enthesopathy, unspecified: Secondary | ICD-10-CM

## 2019-06-21 DIAGNOSIS — M722 Plantar fascial fibromatosis: Secondary | ICD-10-CM | POA: Diagnosis not present

## 2019-06-21 NOTE — Progress Notes (Signed)
Subjective:   Patient ID: Donald Villegas, male   DOB: 73 y.o.   MRN: [id number]   HPI Patient states he is getting a lot of pain in his forefeet bilateral and states that it is increasingly hard for him to be active and walk.  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  Discussed foot structure and I recommended orthotics to try to reduce forefoot pressure against the MPJs and reduce the stress he is experiencing and also discussed the possibility for gabapentin treatment for the tingling.  I would rather try to avoid medicines and use orthotics first and he is scheduled with ped orthotist for functional casting

## 2019-06-23 ENCOUNTER — Telehealth: Payer: Self-pay | Admitting: Podiatry

## 2019-06-23 NOTE — Telephone Encounter (Signed)
Left message for pt that per Dr Mellody Drown request we have received the authorization from [facility] for orthotics and to call to schedule an appt with Liliane Channel  to be measured for them

## 2019-07-04 ENCOUNTER — Other Ambulatory Visit: Payer: Self-pay

## 2019-07-04 ENCOUNTER — Ambulatory Visit (INDEPENDENT_AMBULATORY_CARE_PROVIDER_SITE_OTHER): Payer: PPO | Admitting: Orthotics

## 2019-07-04 DIAGNOSIS — M722 Plantar fascial fibromatosis: Secondary | ICD-10-CM | POA: Diagnosis not present

## 2019-07-04 DIAGNOSIS — M779 Enthesopathy, unspecified: Secondary | ICD-10-CM

## 2019-07-04 DIAGNOSIS — M2041 Other hammer toe(s) (acquired), right foot: Secondary | ICD-10-CM

## 2019-07-04 NOTE — Progress Notes (Signed)
Patient came into today to be cast for Custom Foot Orthotics. Upon recommendation of Dr. Paulla Dolly Patient presents with hx of pes cavus, capsulitis, plantar fas, and metatrarasgla Goals are Plan vendor

## 2019-07-10 ENCOUNTER — Other Ambulatory Visit: Payer: Self-pay

## 2019-07-10 ENCOUNTER — Ambulatory Visit (INDEPENDENT_AMBULATORY_CARE_PROVIDER_SITE_OTHER): Payer: PPO | Admitting: General Practice

## 2019-07-10 DIAGNOSIS — Z7901 Long term (current) use of anticoagulants: Secondary | ICD-10-CM

## 2019-07-10 DIAGNOSIS — Z23 Encounter for immunization: Secondary | ICD-10-CM | POA: Diagnosis not present

## 2019-07-10 LAB — POCT INR: INR: 2.7 (ref 2.0–3.0)

## 2019-07-10 NOTE — Patient Instructions (Signed)
Pre visit review using our clinic review tool, if applicable. No additional management support is needed unless otherwise documented below in the visit note.  Continue to take 1 pill (5mg) daily EXCEPT for 1/2 pill on Sundays and Wednesdays.  Recheck in 6 weeks.    

## 2019-07-25 ENCOUNTER — Other Ambulatory Visit: Payer: PPO | Admitting: Orthotics

## 2019-07-27 ENCOUNTER — Ambulatory Visit (INDEPENDENT_AMBULATORY_CARE_PROVIDER_SITE_OTHER): Payer: PPO | Admitting: Orthotics

## 2019-07-27 ENCOUNTER — Other Ambulatory Visit: Payer: Self-pay

## 2019-07-27 DIAGNOSIS — M2041 Other hammer toe(s) (acquired), right foot: Secondary | ICD-10-CM

## 2019-07-27 DIAGNOSIS — M722 Plantar fascial fibromatosis: Secondary | ICD-10-CM

## 2019-07-27 DIAGNOSIS — M779 Enthesopathy, unspecified: Secondary | ICD-10-CM

## 2019-07-27 NOTE — Progress Notes (Signed)
Patient came in today to pick up custom made foot orthotics.  The goals were accomplished and the patient reported no dissatisfaction with said orthotics.  Patient was advised of breakin period and how to report any issues. 

## 2019-08-21 ENCOUNTER — Ambulatory Visit (INDEPENDENT_AMBULATORY_CARE_PROVIDER_SITE_OTHER): Payer: PPO | Admitting: General Practice

## 2019-08-21 ENCOUNTER — Other Ambulatory Visit: Payer: Self-pay

## 2019-08-21 DIAGNOSIS — Z7901 Long term (current) use of anticoagulants: Secondary | ICD-10-CM | POA: Diagnosis not present

## 2019-08-21 LAB — POCT INR: INR: 2.4 (ref 2.0–3.0)

## 2019-08-21 NOTE — Patient Instructions (Addendum)
Pre visit review using our clinic review tool, if applicable. No additional management support is needed unless otherwise documented below in the visit note.  Continue to take 1 pill (5mg) daily EXCEPT for 1/2 pill on Sundays and Wednesdays.  Recheck in 6 weeks.    

## 2019-08-30 DIAGNOSIS — C775 Secondary and unspecified malignant neoplasm of intrapelvic lymph nodes: Secondary | ICD-10-CM | POA: Diagnosis not present

## 2019-08-30 DIAGNOSIS — Z79818 Long term (current) use of other agents affecting estrogen receptors and estrogen levels: Secondary | ICD-10-CM | POA: Diagnosis not present

## 2019-08-30 DIAGNOSIS — C61 Malignant neoplasm of prostate: Secondary | ICD-10-CM | POA: Diagnosis not present

## 2019-09-12 ENCOUNTER — Other Ambulatory Visit: Payer: Self-pay

## 2019-09-13 DIAGNOSIS — Z9189 Other specified personal risk factors, not elsewhere classified: Secondary | ICD-10-CM | POA: Diagnosis not present

## 2019-09-13 DIAGNOSIS — R6883 Chills (without fever): Secondary | ICD-10-CM | POA: Diagnosis not present

## 2019-09-13 DIAGNOSIS — Z20828 Contact with and (suspected) exposure to other viral communicable diseases: Secondary | ICD-10-CM | POA: Diagnosis not present

## 2019-10-02 ENCOUNTER — Other Ambulatory Visit: Payer: Self-pay

## 2019-10-02 ENCOUNTER — Ambulatory Visit (INDEPENDENT_AMBULATORY_CARE_PROVIDER_SITE_OTHER): Payer: PPO | Admitting: General Practice

## 2019-10-02 DIAGNOSIS — Z7901 Long term (current) use of anticoagulants: Secondary | ICD-10-CM | POA: Diagnosis not present

## 2019-10-02 LAB — POCT INR: INR: 1.6 — AB (ref 2.0–3.0)

## 2019-10-02 NOTE — Patient Instructions (Signed)
Pre visit review using our clinic review tool, if applicable. No additional management support is needed unless otherwise documented below in the visit note.  Continue to take 1 pill (5mg ) daily EXCEPT for 1/2 pill on Sundays and Wednesdays.  Recheck in 4 weeks.

## 2019-10-16 DIAGNOSIS — L570 Actinic keratosis: Secondary | ICD-10-CM | POA: Diagnosis not present

## 2019-10-16 DIAGNOSIS — Z85828 Personal history of other malignant neoplasm of skin: Secondary | ICD-10-CM | POA: Diagnosis not present

## 2019-10-16 DIAGNOSIS — L821 Other seborrheic keratosis: Secondary | ICD-10-CM | POA: Diagnosis not present

## 2019-10-16 DIAGNOSIS — D1801 Hemangioma of skin and subcutaneous tissue: Secondary | ICD-10-CM | POA: Diagnosis not present

## 2019-10-16 DIAGNOSIS — Z85820 Personal history of malignant melanoma of skin: Secondary | ICD-10-CM | POA: Diagnosis not present

## 2019-10-23 ENCOUNTER — Encounter: Payer: Self-pay | Admitting: Family Medicine

## 2019-10-30 ENCOUNTER — Other Ambulatory Visit: Payer: Self-pay

## 2019-10-30 ENCOUNTER — Ambulatory Visit (INDEPENDENT_AMBULATORY_CARE_PROVIDER_SITE_OTHER): Payer: PPO | Admitting: General Practice

## 2019-10-30 DIAGNOSIS — Z7901 Long term (current) use of anticoagulants: Secondary | ICD-10-CM | POA: Diagnosis not present

## 2019-10-30 LAB — POCT INR: INR: 2.4 (ref 2.0–3.0)

## 2019-10-30 NOTE — Patient Instructions (Incomplete)
Pre visit review using our clinic review tool, if applicable. No additional management support is needed unless otherwise documented below in the visit note.  Continue to take 1 pill (5mg) daily EXCEPT for 1/2 pill on Sundays and Wednesdays.  Recheck in 6 weeks.    

## 2019-11-03 ENCOUNTER — Ambulatory Visit: Payer: Medicare Other | Attending: Internal Medicine

## 2019-11-03 DIAGNOSIS — Z23 Encounter for immunization: Secondary | ICD-10-CM | POA: Insufficient documentation

## 2019-11-03 NOTE — Progress Notes (Signed)
   Covid-19 Vaccination Clinic  Name:  Donald Villegas    MRN: [id number] DOB: 04/01/1946  11/03/2019  Donald Villegas was observed post Covid-19 immunization for 15 minutes without incidence. He was provided with Vaccine Information Sheet and instruction to access the V-Safe system.   Donald Villegas was instructed to call 911 with any severe reactions post vaccine: Marland Kitchen Difficulty breathing  . Swelling of your face and throat  . A fast heartbeat  . A bad rash all over your body  . Dizziness and weakness    Immunizations Administered    Name Date Dose VIS Date Route   Pfizer COVID-19 Vaccine 11/03/2019 10:09 AM 0.3 mL 09/29/2019 Intramuscular   Manufacturer: [employer]   Lot: [id number]   [place]: [id number]

## 2019-11-06 DIAGNOSIS — H40053 Ocular hypertension, bilateral: Secondary | ICD-10-CM | POA: Diagnosis not present

## 2019-11-22 ENCOUNTER — Ambulatory Visit: Payer: PPO | Attending: Internal Medicine

## 2019-11-22 DIAGNOSIS — Z23 Encounter for immunization: Secondary | ICD-10-CM | POA: Insufficient documentation

## 2019-11-22 NOTE — Progress Notes (Signed)
   Covid-19 Vaccination Clinic  Name:  Donald Villegas    MRN: [id number] DOB: June 22, 1946  11/22/2019  Donald Villegas was observed post Covid-19 immunization for 15 minutes without incidence. He was provided with Vaccine Information Sheet and instruction to access the V-Safe system.   Donald Villegas was instructed to call 911 with any severe reactions post vaccine: Marland Kitchen Difficulty breathing  . Swelling of your face and throat  . A fast heartbeat  . A bad rash all over your body  . Dizziness and weakness    Immunizations Administered    Name Date Dose VIS Date Route   Pfizer COVID-19 Vaccine 11/22/2019 10:37 AM 0.3 mL 09/29/2019 Intramuscular   Manufacturer: [place]   Lot: [id number]   [place]: [id number]

## 2019-11-23 DIAGNOSIS — M25551 Pain in right hip: Secondary | ICD-10-CM | POA: Diagnosis not present

## 2019-12-01 ENCOUNTER — Other Ambulatory Visit: Payer: Self-pay | Admitting: General Practice

## 2019-12-01 ENCOUNTER — Other Ambulatory Visit: Payer: Self-pay | Admitting: Family Medicine

## 2019-12-01 DIAGNOSIS — Z7901 Long term (current) use of anticoagulants: Secondary | ICD-10-CM

## 2019-12-01 MED ORDER — WARFARIN SODIUM 5 MG PO TABS: ORAL_TABLET | ORAL | 1 refills | Status: DC

## 2019-12-01 NOTE — Telephone Encounter (Signed)
Please advise 

## 2019-12-11 ENCOUNTER — Ambulatory Visit (INDEPENDENT_AMBULATORY_CARE_PROVIDER_SITE_OTHER): Payer: PPO | Admitting: General Practice

## 2019-12-11 ENCOUNTER — Other Ambulatory Visit: Payer: Self-pay

## 2019-12-11 DIAGNOSIS — Z7901 Long term (current) use of anticoagulants: Secondary | ICD-10-CM

## 2019-12-11 LAB — POCT INR: INR: 2.4 (ref 2.0–3.0)

## 2019-12-11 NOTE — Patient Instructions (Signed)
Pre visit review using our clinic review tool, if applicable. No additional management support is needed unless otherwise documented below in the visit note.  Continue to take 1 pill (5mg) daily EXCEPT for 1/2 pill on Sundays and Wednesdays.  Recheck in 6 weeks.    

## 2019-12-15 ENCOUNTER — Encounter: Payer: PPO | Admitting: Family Medicine

## 2020-01-03 ENCOUNTER — Other Ambulatory Visit: Payer: Self-pay

## 2020-01-04 ENCOUNTER — Ambulatory Visit (INDEPENDENT_AMBULATORY_CARE_PROVIDER_SITE_OTHER): Payer: PPO | Admitting: Family Medicine

## 2020-01-04 ENCOUNTER — Encounter: Payer: Self-pay | Admitting: Family Medicine

## 2020-01-04 VITALS — BP 140/80 | HR 60 | Temp 97.3°F | Ht 70.0 in | Wt 191.4 lb

## 2020-01-04 DIAGNOSIS — Z0000 Encounter for general adult medical examination without abnormal findings: Secondary | ICD-10-CM

## 2020-01-04 NOTE — Progress Notes (Signed)
Subjective:    Patient ID: Donald Villegas, male    DOB: June 09, 1946, 74 y.o.   MRN: [id number]  HPI Here for a well exam. He has no complaints. He has some generalized fatigue and he has gained a little weight since starting on Eligard injections. He is seeing Dr. Lawanna Kobus at [facility] for metastatic prostate cancer. After his prostatectomy his PSA was at zero for about 6 months, as expected. 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 It was decided that testosterone suppression was the best treatment option, so he has been getting Eligard injections. He will be due for another colonoscopy this September.    Review of Systems  Constitutional: Positive for fatigue.  HENT: Negative.   Eyes: Negative.   Respiratory: Negative.   Cardiovascular: Negative.   Gastrointestinal: Negative.   Genitourinary: Negative.   Musculoskeletal: Negative.   Skin: Negative.   Neurological: Negative.   Psychiatric/Behavioral: Negative.        Objective:   Physical Exam Constitutional:      General: He is not in acute distress.    Appearance: He is well-developed. He is not diaphoretic.  HENT:     Head: Normocephalic and atraumatic.     Right Ear: External ear normal.     Left Ear: External ear normal.     Nose: Nose normal.     Mouth/Throat:     Pharynx: No oropharyngeal exudate.  Eyes:     General: No scleral icterus.       Right eye: No discharge.        Left eye: No discharge.     Conjunctiva/sclera: Conjunctivae normal.     Pupils: Pupils are equal, round, and reactive to light.  Neck:     Thyroid: No thyromegaly.     Vascular: No JVD.     Trachea: No tracheal deviation.  Cardiovascular:     Rate and Rhythm: Normal rate and regular rhythm.     Heart sounds: Normal heart sounds. No murmur. No friction rub. No gallop.   Pulmonary:     Effort: Pulmonary effort is normal. No respiratory distress.     Breath sounds:  Normal breath sounds. No wheezing or rales.  Chest:     Chest wall: No tenderness.  Abdominal:     General: Bowel sounds are normal. There is no distension.     Palpations: Abdomen is soft. There is no mass.     Tenderness: There is no abdominal tenderness. There is no guarding or rebound.  Genitourinary:    Penis: No tenderness.   Musculoskeletal:        General: No tenderness. Normal range of motion.     Cervical back: Neck supple.  Lymphadenopathy:     Cervical: No cervical adenopathy.  Skin:    General: Skin is warm and dry.     Coloration: Skin is not pale.     Findings: No erythema or rash.  Neurological:     Mental Status: He is alert and oriented to person, place, and time.     Cranial Nerves: No cranial nerve deficit.     Motor: No abnormal muscle tone.     Coordination: Coordination normal.     Deep Tendon Reflexes: Reflexes are normal and symmetric. Reflexes normal.  Psychiatric:        Behavior: Behavior normal.        Thought Content: Thought content normal.  Judgment: Judgment normal.           Assessment & Plan:  Well exam. We discussed diet and exercise. Get fasting lipids sometime soon . Alysia Penna, MD

## 2020-01-15 DIAGNOSIS — M79641 Pain in right hand: Secondary | ICD-10-CM | POA: Diagnosis not present

## 2020-01-15 DIAGNOSIS — M65311 Trigger thumb, right thumb: Secondary | ICD-10-CM | POA: Diagnosis not present

## 2020-01-15 DIAGNOSIS — M65341 Trigger finger, right ring finger: Secondary | ICD-10-CM | POA: Diagnosis not present

## 2020-01-15 DIAGNOSIS — M6530 Trigger finger, unspecified finger: Secondary | ICD-10-CM | POA: Diagnosis not present

## 2020-01-15 DIAGNOSIS — M79642 Pain in left hand: Secondary | ICD-10-CM | POA: Diagnosis not present

## 2020-01-23 ENCOUNTER — Other Ambulatory Visit: Payer: Self-pay

## 2020-01-24 ENCOUNTER — Ambulatory Visit (INDEPENDENT_AMBULATORY_CARE_PROVIDER_SITE_OTHER): Payer: PPO | Admitting: General Practice

## 2020-01-24 DIAGNOSIS — Z7901 Long term (current) use of anticoagulants: Secondary | ICD-10-CM | POA: Diagnosis not present

## 2020-01-24 LAB — POCT INR: INR: 2.4 (ref 2.0–3.0)

## 2020-01-24 NOTE — Patient Instructions (Addendum)
Pre visit review using our clinic review tool, if applicable. No additional management support is needed unless otherwise documented below in the visit note.  Continue to take 1 pill (5mg) daily EXCEPT for 1/2 pill on Sundays and Wednesdays.  Recheck in 6 weeks.    

## 2020-01-29 ENCOUNTER — Telehealth: Payer: Self-pay | Admitting: Family Medicine

## 2020-01-29 DIAGNOSIS — J45909 Unspecified asthma, uncomplicated: Secondary | ICD-10-CM

## 2020-01-29 DIAGNOSIS — K219 Gastro-esophageal reflux disease without esophagitis: Secondary | ICD-10-CM

## 2020-01-29 NOTE — Progress Notes (Signed)
  Chronic Care Management   Note  01/29/2020 Name: Donald Villegas MRN: [id number] DOB: 1946-06-21  Donald Villegas is a 75 y.o. year old male who is a primary care patient of Laurey Morale, MD. I reached out to [name] by phone today in response to a referral sent by Donald Villegas's PCP, Laurey Morale, MD.   Donald Villegas was given information about Chronic Care Management services today including:  1. CCM service includes personalized support from designated clinical staff supervised by his physician, including individualized plan of care and coordination with other care providers 2. 24/7 contact phone numbers for assistance for urgent and routine care needs. 3. Service will only be billed when office clinical staff spend 20 minutes or more in a month to coordinate care. 4. Only one practitioner may furnish and bill the service in a calendar month. 5. The patient may stop CCM services at any time (effective at the end of the month) by phone call to the office staff.   Patient agreed to services and verbal consent obtained.   Follow up plan:   Raynicia Dukes [employer] Scheduler

## 2020-02-13 DIAGNOSIS — M25562 Pain in left knee: Secondary | ICD-10-CM | POA: Diagnosis not present

## 2020-02-14 DIAGNOSIS — M65311 Trigger thumb, right thumb: Secondary | ICD-10-CM | POA: Diagnosis not present

## 2020-02-14 DIAGNOSIS — M65341 Trigger finger, right ring finger: Secondary | ICD-10-CM | POA: Diagnosis not present

## 2020-02-14 DIAGNOSIS — M6530 Trigger finger, unspecified finger: Secondary | ICD-10-CM | POA: Diagnosis not present

## 2020-02-16 NOTE — Addendum Note (Signed)
Addended by: Alysia Penna A on: 02/16/2020 07:31 AM   Modules accepted: Orders

## 2020-02-22 ENCOUNTER — Ambulatory Visit: Payer: PPO

## 2020-02-22 ENCOUNTER — Other Ambulatory Visit: Payer: Self-pay

## 2020-02-22 DIAGNOSIS — K219 Gastro-esophageal reflux disease without esophagitis: Secondary | ICD-10-CM

## 2020-02-22 DIAGNOSIS — Z7901 Long term (current) use of anticoagulants: Secondary | ICD-10-CM

## 2020-02-22 NOTE — Chronic Care Management (AMB) (Signed)
Chronic Care Management Pharmacy  Name: Donald Villegas  MRN: [id number] DOB: 1946/05/30  Initial Questions: 1. Have you seen any other providers since your last visit? NA 2. Any changes in your medicines or health? No   Chief Complaint/ HPI  Donald Villegas,  74 y.o. , male presents for their Initial CCM visit with the clinical pharmacist via telephone due to COVID-19 Pandemic.  PCP : Laurey Morale, MD  Their chronic conditions include: GERD, Long term current use of anticoagulant (history of PE)  Office Visits: 01/04/2020- Alysia Penna, MD- Patient presented for office visit for well exam. Patient reported fatigue and weight gain since starting Eligard injections (managed by oncology). Diet and exercise were discussed. Patient to obtain fasting lipids soon.   01/24/2020- Meriam Sprague, RN- patient presented for anti-coag visit. INR goal: 2.0-3.0. INF: 2.4. Patient to continue: 2.5mg  Sunday and Wednesday and 5mg  all other days. Next INR check: 03/11/2020  Consult Visit: 08/30/2019- Oncology- Isaias Cowman, NP- Patient presented for prostate cancer follow up. Diagnosed in 2010 and underwent RALP. PSA remained undetectable until in 2015 rose ro 0.4. PSA increased over 2 years after. In last 3 years, PSA doubled. CT revealed new presacral and perirectal nodes. Patient was started on lupron and PSA decreased. Now PSA remains undetectable. Patient given Eligard 45mg  sq (Lupron is on drug shortage). Patient to return in 6 months for labs, follow up and ADT.   Medications: Outpatient Encounter Medications as of 02/22/2020  Medication Sig  . famotidine (PEPCID) 20 MG tablet Take 10 mg by mouth 2 (two) times daily.   Marland Kitchen gatifloxacin (ZYMAXID) 0.5 % SOLN INSTILL 1 DROP INTO AFFECTED EYE(S) 4 TIMES A DAY FOR 10 DAYS  . predniSONE (DELTASONE) 20 MG tablet Take 5-10 mg by mouth daily as needed (for rash).  . valACYclovir (VALTREX) 1000 MG tablet Take 1,000 mg by mouth 3 (three) times daily.  Marland Kitchen warfarin  (COUMADIN) 5 MG tablet Take 1 tablet daily except take 1/2 on Sun and Wed or TAKE AS DIRECTED BY COUMADIN CLINIC  . fluorometholone (FML) 0.1 % ophthalmic suspension INT 1 GTT IN OD QOD   No facility-administered encounter medications on file as of 02/22/2020.    Current Diagnosis/Assessment:  Goals Addressed            This Visit's Progress   . Pharmacy Care Plan       CARE PLAN ENTRY  Current Barriers:  . 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 Pharmacist Clinical Goal(s) o Over the next 365 days, patient will work with PharmD and providers to modify therapy as needed to control symptoms.  . Current regimen:  o Famotidine (Pepcid) 20mg , 1 tablet twice daily  . Interventions: o We discussed:  non-pharmacological interventions for acid reflux. Take measures to prevent acid reflux, such as avoiding spicy foods, avoiding caffeine, avoid laying down a few hours after eating, and raising the head of the bed. . Patient self care activities - patient will: o Continue current therapy.   Long term current use of anticoagulants (history of PE) . Pharmacist Clinical Goal(s) o Over the next days, patient will work with providers and Coumadin clinic.  . Current regimen:  o Warfarin 5mg , 1 tablet daily, (except 0.5 tablet on Sunday and Wednesday or as directed by Coumadin Clinic . Interventions: o Discussed monitoring for signs and symptoms for  bleeding (coughing up blood, prolonged nose bleeds, black, tarry stools) . Patient self care activities o Patient will continue current therapy.   Medication management . Pharmacist Clinical Goal(s): o Over the next 360 days, patient will work with PharmD and providers to maintain optimal medication adherence . Current [facility] . Interventions o Comprehensive medication review  performed. o Continue current medication management strategy . Patient self care activities - Over the next 365 days, patient will: o Take medications as prescribed o Report any questions or concerns to PharmD and/or provider(s)  Initial goal documentation       SDOH Interventions     Most Recent Value  SDOH Interventions  SDOH Interventions for the Following Domains  Transportation, Financial Strain  Financial Strain Interventions  Other (Comment) [Not needed]  Transportation Interventions  Other (Comment) [Not needed]      GERD  Patient reports symptoms if he misses a dose and denies food triggers.  Patient has failed these meds in past: none  Patient is currently controlled on the following medications:   Famotidine (Pepcid) 20mg , 1 tablet twice daily   We discussed:  non-pharmacological interventions for acid reflux. Take measures to prevent acid reflux, such as avoiding spicy foods, avoiding caffeine, avoid laying down a few hours after eating, and raising the head of the bed.   Plan Continue current medications  Long term current use of anticoagulants (history of PE)  Patient reported being on warfarin after his 2nd PE about 11 to 12 years ago and obtains INR checks every 6 to 8 weeks.   Patient has failed these meds in past: none  Patient is currently controlled on the following medications:   Warfarin 5mg , 1 tablet daily, (except 0.5 tablet on Sunday and Wednesday or as directed by Coumadin Clinic  INR  Date Value Ref Range Status  01/24/2020 2.4 2.0 - 3.0 Final   We discussed: Discussed monitoring for signs and symptoms for bleeding (coughing up blood, prolonged nose bleeds, black, tarry stools).  Plan Continue current medications  OTC/ supplements/ MISC  Patient is currently on the following medications:   Gatifloxacin 0.5% solution, 1 drop in affected eye(s) four times a day for 10 days (patient reports taking ~2 years due to infection in right eye. He  states he discussed with eye doc and will be staying on it. F/u visit in 2-3 months)  Valacyclovir (Valtrex) 1000mg , 1 tablet three times daily. (also takes for his eye infection)    Prednisone 20mg , take 5 to 10mg  daily as needed for rash   Medication Management  Patient organizes medications: patient no issues taking medications since he only takes 2 medications regularly.   [facility] Adherence: no gaps in refill history (per medication dispense history from 08/26/2019 to 02/22/2020)    Follow up Follow up visit with PharmD in 1 year.  Will conduct general telephone calls for periodic check-ins before next visit.   Anson Crofts, PharmD Clinical Pharmacist [facility] [place] [phone]

## 2020-02-28 DIAGNOSIS — Z79818 Long term (current) use of other agents affecting estrogen receptors and estrogen levels: Secondary | ICD-10-CM | POA: Diagnosis not present

## 2020-02-28 DIAGNOSIS — C775 Secondary and unspecified malignant neoplasm of intrapelvic lymph nodes: Secondary | ICD-10-CM | POA: Diagnosis not present

## 2020-02-28 DIAGNOSIS — C61 Malignant neoplasm of prostate: Secondary | ICD-10-CM | POA: Diagnosis not present

## 2020-03-01 NOTE — Patient Instructions (Addendum)
Visit Information  Goals Addressed            This Visit's Progress   . Pharmacy Care Plan       CARE PLAN ENTRY  Current Barriers:  . 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 Pharmacist Clinical Goal(s) o Over the next 365 days, patient will work with PharmD and providers to modify therapy as needed to control symptoms.  . Current regimen:  o Famotidine (Pepcid) 20mg , 1 tablet twice daily  . Interventions: o We discussed:  non-pharmacological interventions for acid reflux. Take measures to prevent acid reflux, such as avoiding spicy foods, avoiding caffeine, avoid laying down a few hours after eating, and raising the head of the bed. . Patient self care activities - patient will: o Continue current therapy.   Long term current use of anticoagulants (history of PE) . Pharmacist Clinical Goal(s) o Over the next days, patient will work with providers and Coumadin clinic.  . Current regimen:  o Warfarin 5mg , 1 tablet daily, (except 0.5 tablet on Sunday and Wednesday or as directed by Coumadin Clinic . Interventions: o Discussed monitoring for signs and symptoms for bleeding (coughing up blood, prolonged nose bleeds, black, tarry stools) . Patient self care activities o Patient will continue current therapy.   Medication management . Pharmacist Clinical Goal(s): o Over the next 360 days, patient will work with PharmD and providers to maintain optimal medication adherence . [facility] . Interventions o Comprehensive medication review performed. o Continue current medication management strategy . Patient self care activities - Over the next 365 days, patient will: o Take medications as prescribed o Report any questions or concerns to PharmD and/or provider(s)  Initial goal documentation        Donald Villegas was given  information about Chronic Care Management services today including:  1. CCM service includes personalized support from designated clinical staff supervised by his physician, including individualized plan of care and coordination with other care providers 2. 24/7 contact phone numbers for assistance for urgent and routine care needs. 3. Standard insurance, coinsurance, copays and deductibles apply for chronic care management only during months in which we provide at least 20 minutes of these services. Most insurances cover these services at 100%, however patients may be responsible for any copay, coinsurance and/or deductible if applicable. This service may help you avoid the need for more expensive face-to-face services. 4. Only one practitioner may furnish and bill the service in a calendar month. 5. The patient may stop CCM services at any time (effective at the end of the month) by phone call to the office staff.  Patient agreed to services and verbal consent obtained.   The patient verbalized understanding of instructions provided today and agreed to receive a mailed copy of patient instruction and/or educational materials. Telephone follow up appointment with pharmacy team member scheduled for: 02/21/2021  Anson Crofts, PharmD Clinical Pharmacist [facility] [place] [phone]    Food Choices for Gastroesophageal Reflux Disease, Adult When you have gastroesophageal reflux disease (GERD), the foods you eat and your eating habits are very important. Choosing the right foods can help ease your discomfort. Think about working with a nutrition specialist (dietitian) to help you make good choices. What are tips for following this plan?  Meals  Choose healthy foods that are low in fat, such as fruits,  vegetables, whole grains, low-fat dairy products, and lean meat, fish, and poultry.  Eat small meals often instead of 3 large meals a day. Eat your meals slowly, and  in a place where you are relaxed. Avoid bending over or lying down until 2-3 hours after eating.  Avoid eating meals 2-3 hours before bed.  Avoid drinking a lot of liquid with meals.  Cook foods using methods other than frying. Bake, grill, or broil food instead.  Avoid or limit: ? Chocolate. ? Peppermint or spearmint. ? Alcohol. ? Pepper. ? Black and decaffeinated coffee. ? Black and decaffeinated tea. ? Bubbly (carbonated) soft drinks. ? Caffeinated energy drinks and soft drinks.  Limit high-fat foods such as: ? Fatty meat or fried foods. ? Whole milk, cream, butter, or ice cream. ? Nuts and nut butters. ? Pastries, donuts, and sweets made with butter or shortening.  Avoid foods that cause symptoms. These foods may be different for everyone. Common foods that cause symptoms include: ? Tomatoes. ? Oranges, lemons, and limes. ? Peppers. ? Spicy food. ? Onions and garlic. ? Vinegar. Lifestyle  Maintain a healthy weight. Ask your doctor what weight is healthy for you. If you need to lose weight, work with your doctor to do so safely.  Exercise for at least 30 minutes for 5 or more days each week, or as told by your doctor.  Wear loose-fitting clothes.  Do not smoke. If you need help quitting, ask your doctor.  Sleep with the head of your bed higher than your feet. Use a wedge under the mattress or blocks under the bed frame to raise the head of the bed. Summary  When you have gastroesophageal reflux disease (GERD), food and lifestyle choices are very important in easing your symptoms.  Eat small meals often instead of 3 large meals a day. Eat your meals slowly, and in a place where you are relaxed.  Limit high-fat foods such as fatty meat or fried foods.  Avoid bending over or lying down until 2-3 hours after eating.  Avoid peppermint and spearmint, caffeine, alcohol, and chocolate. This information is not intended to replace advice given to you by your health care  provider. Make sure you discuss any questions you have with your health care provider. Document Revised: 01/26/2019 Document Reviewed: 11/10/2016 Elsevier Patient Education  [place].

## 2020-03-11 ENCOUNTER — Other Ambulatory Visit: Payer: Self-pay

## 2020-03-11 ENCOUNTER — Ambulatory Visit (INDEPENDENT_AMBULATORY_CARE_PROVIDER_SITE_OTHER): Payer: PPO | Admitting: General Practice

## 2020-03-11 DIAGNOSIS — Z7901 Long term (current) use of anticoagulants: Secondary | ICD-10-CM | POA: Diagnosis not present

## 2020-03-11 LAB — POCT INR: INR: 2.1 (ref 2.0–3.0)

## 2020-03-11 NOTE — Patient Instructions (Signed)
Pre visit review using our clinic review tool, if applicable. No additional management support is needed unless otherwise documented below in the visit note.  Continue to take 1 pill (5mg) daily EXCEPT for 1/2 pill on Sundays and Wednesdays.  Recheck in 6 weeks.    

## 2020-04-15 DIAGNOSIS — C44311 Basal cell carcinoma of skin of nose: Secondary | ICD-10-CM | POA: Diagnosis not present

## 2020-04-15 DIAGNOSIS — D485 Neoplasm of uncertain behavior of skin: Secondary | ICD-10-CM | POA: Diagnosis not present

## 2020-04-15 DIAGNOSIS — L570 Actinic keratosis: Secondary | ICD-10-CM | POA: Diagnosis not present

## 2020-04-15 DIAGNOSIS — Z85820 Personal history of malignant melanoma of skin: Secondary | ICD-10-CM | POA: Diagnosis not present

## 2020-04-15 DIAGNOSIS — D225 Melanocytic nevi of trunk: Secondary | ICD-10-CM | POA: Diagnosis not present

## 2020-04-15 DIAGNOSIS — Z85828 Personal history of other malignant neoplasm of skin: Secondary | ICD-10-CM | POA: Diagnosis not present

## 2020-04-15 DIAGNOSIS — D1801 Hemangioma of skin and subcutaneous tissue: Secondary | ICD-10-CM | POA: Diagnosis not present

## 2020-04-15 DIAGNOSIS — L821 Other seborrheic keratosis: Secondary | ICD-10-CM | POA: Diagnosis not present

## 2020-04-25 DIAGNOSIS — H0014 Chalazion left upper eyelid: Secondary | ICD-10-CM | POA: Diagnosis not present

## 2020-05-06 ENCOUNTER — Ambulatory Visit (INDEPENDENT_AMBULATORY_CARE_PROVIDER_SITE_OTHER): Payer: PPO | Admitting: General Practice

## 2020-05-06 ENCOUNTER — Other Ambulatory Visit: Payer: Self-pay

## 2020-05-06 DIAGNOSIS — Z7901 Long term (current) use of anticoagulants: Secondary | ICD-10-CM

## 2020-05-06 LAB — POCT INR: INR: 1.7 — AB (ref 2.0–3.0)

## 2020-05-06 NOTE — Patient Instructions (Signed)
Pre visit review using our clinic review tool, if applicable. No additional management support is needed unless otherwise documented below in the visit note.  Continue to take 1 pill (5mg) daily EXCEPT for 1/2 pill on Sundays and Wednesdays.  Recheck in 6 weeks.    

## 2020-05-07 DIAGNOSIS — Z85820 Personal history of malignant melanoma of skin: Secondary | ICD-10-CM | POA: Diagnosis not present

## 2020-05-07 DIAGNOSIS — Z85828 Personal history of other malignant neoplasm of skin: Secondary | ICD-10-CM | POA: Diagnosis not present

## 2020-05-07 DIAGNOSIS — C44311 Basal cell carcinoma of skin of nose: Secondary | ICD-10-CM | POA: Diagnosis not present

## 2020-05-23 DIAGNOSIS — H0014 Chalazion left upper eyelid: Secondary | ICD-10-CM | POA: Diagnosis not present

## 2020-06-17 ENCOUNTER — Other Ambulatory Visit: Payer: Self-pay

## 2020-06-17 ENCOUNTER — Ambulatory Visit (INDEPENDENT_AMBULATORY_CARE_PROVIDER_SITE_OTHER): Payer: PPO | Admitting: General Practice

## 2020-06-17 DIAGNOSIS — Z7901 Long term (current) use of anticoagulants: Secondary | ICD-10-CM | POA: Diagnosis not present

## 2020-06-17 LAB — POCT INR: INR: 4 — AB (ref 2.0–3.0)

## 2020-06-17 NOTE — Patient Instructions (Addendum)
Pre visit review using our clinic review tool, if applicable. No additional management support is needed unless otherwise documented below in the visit note.  Continue to take 1 pill (5mg ) daily EXCEPT for 1/2 pill on Sundays and Wednesdays.  Recheck in 3 to 4 weeks.

## 2020-06-25 ENCOUNTER — Ambulatory Visit: Payer: Self-pay

## 2020-06-27 DIAGNOSIS — M25562 Pain in left knee: Secondary | ICD-10-CM | POA: Diagnosis not present

## 2020-07-04 DIAGNOSIS — M1712 Unilateral primary osteoarthritis, left knee: Secondary | ICD-10-CM | POA: Diagnosis not present

## 2020-07-08 ENCOUNTER — Telehealth: Payer: Self-pay | Admitting: Family Medicine

## 2020-07-08 NOTE — Telephone Encounter (Signed)
error 

## 2020-07-15 ENCOUNTER — Other Ambulatory Visit (INDEPENDENT_AMBULATORY_CARE_PROVIDER_SITE_OTHER): Payer: PPO

## 2020-07-15 ENCOUNTER — Telehealth: Payer: Self-pay | Admitting: Family Medicine

## 2020-07-15 ENCOUNTER — Ambulatory Visit (INDEPENDENT_AMBULATORY_CARE_PROVIDER_SITE_OTHER): Payer: PPO | Admitting: General Practice

## 2020-07-15 ENCOUNTER — Other Ambulatory Visit: Payer: Self-pay

## 2020-07-15 DIAGNOSIS — C61 Malignant neoplasm of prostate: Secondary | ICD-10-CM

## 2020-07-15 DIAGNOSIS — Z7901 Long term (current) use of anticoagulants: Secondary | ICD-10-CM | POA: Diagnosis not present

## 2020-07-15 LAB — POCT INR: INR: 2.9 (ref 2.0–3.0)

## 2020-07-15 NOTE — Telephone Encounter (Signed)
Pt stopped by the front desk stating that he has had [facility] to send some orders over for him to have his PSA done and he is aware that we only do [facility] labs.  Pt would like to see if he can have his labs done here in the office may I have a order for the labs?

## 2020-07-15 NOTE — Addendum Note (Signed)
Addended by: Marrion Coy on: 07/15/2020 03:54 PM   Modules accepted: Orders

## 2020-07-15 NOTE — Telephone Encounter (Signed)
I ordered the PSA. He needs to make a lab appt

## 2020-07-15 NOTE — Patient Instructions (Signed)
Pre visit review using our clinic review tool, if applicable. No additional management support is needed unless otherwise documented below in the visit note.  Continue to take 1 pill (5mg ) daily EXCEPT for 1/2 pill on Sundays and Wednesdays.  Recheck in 3 to 4 weeks.

## 2020-07-15 NOTE — Telephone Encounter (Signed)
Okay to order?

## 2020-07-15 NOTE — Telephone Encounter (Signed)
Spoke with patient and lab appointment scheuled.

## 2020-07-16 LAB — PSA: PSA: <0.04 ng/mL (ref <=4.0)

## 2020-08-06 DIAGNOSIS — M1712 Unilateral primary osteoarthritis, left knee: Secondary | ICD-10-CM | POA: Diagnosis not present

## 2020-08-26 ENCOUNTER — Ambulatory Visit (INDEPENDENT_AMBULATORY_CARE_PROVIDER_SITE_OTHER): Payer: PPO | Admitting: General Practice

## 2020-08-26 ENCOUNTER — Other Ambulatory Visit: Payer: Self-pay

## 2020-08-26 DIAGNOSIS — Z7901 Long term (current) use of anticoagulants: Secondary | ICD-10-CM

## 2020-08-26 LAB — POCT INR: INR: 2.3 (ref 2.0–3.0)

## 2020-08-26 NOTE — Patient Instructions (Signed)
Pre visit review using our clinic review tool, if applicable. No additional management support is needed unless otherwise documented below in the visit note.  Continue to take 1 pill (5mg ) daily EXCEPT for 1/2 pill on Sundays and Wednesdays.  Recheck in 6 weeks.

## 2020-08-27 ENCOUNTER — Telehealth: Payer: Self-pay | Admitting: Family Medicine

## 2020-08-27 NOTE — Telephone Encounter (Signed)
Left message for patient to call back and schedule Medicare Annual Wellness Visit (AWV) either virtually or in office.  Last AWV 10/25/14; please schedule at anytime with [facility] Nurse Health Advisor 1.  This should be a 45 minute visit.

## 2020-08-28 DIAGNOSIS — C61 Malignant neoplasm of prostate: Secondary | ICD-10-CM | POA: Diagnosis not present

## 2020-08-28 DIAGNOSIS — C775 Secondary and unspecified malignant neoplasm of intrapelvic lymph nodes: Secondary | ICD-10-CM | POA: Diagnosis not present

## 2020-10-07 ENCOUNTER — Ambulatory Visit (INDEPENDENT_AMBULATORY_CARE_PROVIDER_SITE_OTHER): Payer: PPO | Admitting: General Practice

## 2020-10-07 ENCOUNTER — Other Ambulatory Visit: Payer: Self-pay

## 2020-10-07 DIAGNOSIS — Z23 Encounter for immunization: Secondary | ICD-10-CM

## 2020-10-07 DIAGNOSIS — Z7901 Long term (current) use of anticoagulants: Secondary | ICD-10-CM | POA: Diagnosis not present

## 2020-10-07 LAB — POCT INR: INR: 2.1 (ref 2.0–3.0)

## 2020-10-07 NOTE — Patient Instructions (Addendum)
Pre visit review using our clinic review tool, if applicable. No additional management support is needed unless otherwise documented below in the visit note.  Continue to take 1 pill (5mg) daily EXCEPT for 1/2 pill on Sundays and Wednesdays.  Recheck in 6 to 8 weeks.  

## 2020-10-21 DIAGNOSIS — L821 Other seborrheic keratosis: Secondary | ICD-10-CM | POA: Diagnosis not present

## 2020-10-21 DIAGNOSIS — C4359 Malignant melanoma of other part of trunk: Secondary | ICD-10-CM | POA: Diagnosis not present

## 2020-10-21 DIAGNOSIS — D485 Neoplasm of uncertain behavior of skin: Secondary | ICD-10-CM | POA: Diagnosis not present

## 2020-10-21 DIAGNOSIS — Z85820 Personal history of malignant melanoma of skin: Secondary | ICD-10-CM | POA: Diagnosis not present

## 2020-10-21 DIAGNOSIS — Z85828 Personal history of other malignant neoplasm of skin: Secondary | ICD-10-CM | POA: Diagnosis not present

## 2020-10-21 DIAGNOSIS — D225 Melanocytic nevi of trunk: Secondary | ICD-10-CM | POA: Diagnosis not present

## 2020-10-21 DIAGNOSIS — L570 Actinic keratosis: Secondary | ICD-10-CM | POA: Diagnosis not present

## 2020-11-07 DIAGNOSIS — Z85820 Personal history of malignant melanoma of skin: Secondary | ICD-10-CM | POA: Diagnosis not present

## 2020-11-07 DIAGNOSIS — D0359 Melanoma in situ of other part of trunk: Secondary | ICD-10-CM | POA: Diagnosis not present

## 2020-11-07 DIAGNOSIS — C4359 Malignant melanoma of other part of trunk: Secondary | ICD-10-CM | POA: Diagnosis not present

## 2020-11-07 DIAGNOSIS — Z85828 Personal history of other malignant neoplasm of skin: Secondary | ICD-10-CM | POA: Diagnosis not present

## 2020-11-07 DIAGNOSIS — D225 Melanocytic nevi of trunk: Secondary | ICD-10-CM | POA: Diagnosis not present

## 2020-11-22 DIAGNOSIS — H2513 Age-related nuclear cataract, bilateral: Secondary | ICD-10-CM | POA: Diagnosis not present

## 2020-11-22 DIAGNOSIS — H40053 Ocular hypertension, bilateral: Secondary | ICD-10-CM | POA: Diagnosis not present

## 2020-11-22 DIAGNOSIS — H524 Presbyopia: Secondary | ICD-10-CM | POA: Diagnosis not present

## 2020-12-02 ENCOUNTER — Other Ambulatory Visit: Payer: Self-pay

## 2020-12-02 ENCOUNTER — Telehealth: Payer: Self-pay | Admitting: Family Medicine

## 2020-12-02 ENCOUNTER — Ambulatory Visit (INDEPENDENT_AMBULATORY_CARE_PROVIDER_SITE_OTHER): Payer: PPO | Admitting: General Practice

## 2020-12-02 DIAGNOSIS — Z7901 Long term (current) use of anticoagulants: Secondary | ICD-10-CM

## 2020-12-02 LAB — POCT INR: INR: 2.2 (ref 2.0–3.0)

## 2020-12-02 NOTE — Telephone Encounter (Signed)
Left message for patient to call back and schedule Medicare Annual Wellness Visit (AWV) either virtually or in office. No detailed information left   Last AWV no information  please schedule at anytime with [facility] Nurse Health Advisor 1 or 2   This should be a 45 minute visit.

## 2020-12-02 NOTE — Patient Instructions (Addendum)
Pre visit review using our clinic review tool, if applicable. No additional management support is needed unless otherwise documented below in the visit note.  Continue to take 1 pill (5mg ) daily EXCEPT for 1/2 pill on Sundays and Wednesdays.  Recheck in 6 to 8 weeks.

## 2020-12-13 DIAGNOSIS — H04123 Dry eye syndrome of bilateral lacrimal glands: Secondary | ICD-10-CM | POA: Diagnosis not present

## 2020-12-27 DIAGNOSIS — H04122 Dry eye syndrome of left lacrimal gland: Secondary | ICD-10-CM | POA: Diagnosis not present

## 2020-12-27 DIAGNOSIS — H04121 Dry eye syndrome of right lacrimal gland: Secondary | ICD-10-CM | POA: Diagnosis not present

## 2021-01-02 DIAGNOSIS — H04122 Dry eye syndrome of left lacrimal gland: Secondary | ICD-10-CM | POA: Diagnosis not present

## 2021-01-07 DIAGNOSIS — M1611 Unilateral primary osteoarthritis, right hip: Secondary | ICD-10-CM | POA: Diagnosis not present

## 2021-01-13 DIAGNOSIS — H04122 Dry eye syndrome of left lacrimal gland: Secondary | ICD-10-CM | POA: Diagnosis not present

## 2021-01-17 ENCOUNTER — Telehealth: Payer: Self-pay | Admitting: Pharmacist

## 2021-01-17 NOTE — Progress Notes (Signed)
    Chronic Care Management Pharmacy Assistant   Name: KORI COLIN  MRN: [id number] DOB: 1945/11/21   Reason for Encounter: General Adherence Call     Recent office visits:  10/07/20-Boyd, Barbette Or, RN (Anti-coag visit). 08/26/20-Boyd, Barbette Or, RN (Anti-coag visit).  Recent consult visits:  11/22/20-Bowen, Leory Plowman, MD ([place]). 11/07/20-Goodrich, Herbie Baltimore, MD ([place]). 10/21/20-Jones, Quillian Quince, MD ([facility]). 08/28/2020-George, Quinn Plowman, MD ([facility]). 08/06/20-Wainer, Herbie Baltimore, MD ([facility]).   Hospital visits:  None in previous 6 months  Medications: Outpatient Encounter Medications as of 01/17/2021  Medication Sig  . famotidine (PEPCID) 20 MG tablet Take 10 mg by mouth 2 (two) times daily.   . fluorometholone (FML) 0.1 % ophthalmic suspension INT 1 GTT IN OD QOD  . gatifloxacin (ZYMAXID) 0.5 % SOLN INSTILL 1 DROP INTO AFFECTED EYE(S) 4 TIMES A DAY FOR 10 DAYS  . predniSONE (DELTASONE) 20 MG tablet Take 5-10 mg by mouth daily as needed (for rash).  . valACYclovir (VALTREX) 1000 MG tablet Take 1,000 mg by mouth 3 (three) times daily.  Marland Kitchen warfarin (COUMADIN) 5 MG tablet Take 1 tablet daily except take 1/2 on Sun and Wed or TAKE AS DIRECTED BY COUMADIN CLINIC   No facility-administered encounter medications on file as of 01/17/2021.      Star Rating Drugs: None.  Jeni Salles, CPP Notified.  Raynelle Highland, [place] Pharmacist Assistant [phone] CCM Total Time: 42 minutes

## 2021-01-27 ENCOUNTER — Ambulatory Visit (INDEPENDENT_AMBULATORY_CARE_PROVIDER_SITE_OTHER): Payer: PPO | Admitting: General Practice

## 2021-01-27 ENCOUNTER — Other Ambulatory Visit: Payer: Self-pay

## 2021-01-27 DIAGNOSIS — Z7901 Long term (current) use of anticoagulants: Secondary | ICD-10-CM

## 2021-01-27 LAB — POCT INR: INR: 2.3 (ref 2.0–3.0)

## 2021-01-27 NOTE — Patient Instructions (Signed)
Pre visit review using our clinic review tool, if applicable. No additional management support is needed unless otherwise documented below in the visit note.  Continue to take 1 pill (5mg ) daily EXCEPT for 1/2 pill on Sundays and Wednesdays.  Recheck in 6 to 8 weeks.

## 2021-02-04 ENCOUNTER — Other Ambulatory Visit: Payer: Self-pay | Admitting: Family Medicine

## 2021-02-04 DIAGNOSIS — Z7901 Long term (current) use of anticoagulants: Secondary | ICD-10-CM

## 2021-02-04 NOTE — Telephone Encounter (Signed)
Pt LOV was 01/04/2020  Last refill was done on 12/01/2019 for 90 with 1 refill Last PT/INR was done on 01/27/2021  Please advise if ok to refill

## 2021-02-11 DIAGNOSIS — H04122 Dry eye syndrome of left lacrimal gland: Secondary | ICD-10-CM | POA: Diagnosis not present

## 2021-02-20 ENCOUNTER — Telehealth: Payer: Self-pay | Admitting: Pharmacist

## 2021-02-20 NOTE — Chronic Care Management (AMB) (Addendum)
    Chronic Care Management Pharmacy Assistant   Name: Donald Villegas  MRN: [id number] DOB: 09/26/1946  Left voicemail reminding patient of appointment on 02/21/21 at 11am by telephone with Jeni Salles the clinical pharmacist. Advised patient to have all medications and any blood pressure or blood glucose readings close by and available.   Patient called back to cancel appointment on 02/21/21 and reschedule to 03/26/21 at 8:30am by phone. Informed patient we will cancel and reschedule. Patient verbalized understanding.  [place] [phone]

## 2021-02-21 ENCOUNTER — Telehealth: Payer: PPO

## 2021-02-26 ENCOUNTER — Other Ambulatory Visit: Payer: Self-pay

## 2021-02-26 ENCOUNTER — Ambulatory Visit: Payer: PPO

## 2021-02-26 DIAGNOSIS — C775 Secondary and unspecified malignant neoplasm of intrapelvic lymph nodes: Secondary | ICD-10-CM | POA: Diagnosis not present

## 2021-02-26 DIAGNOSIS — Z0000 Encounter for general adult medical examination without abnormal findings: Secondary | ICD-10-CM

## 2021-02-26 DIAGNOSIS — Z79818 Long term (current) use of other agents affecting estrogen receptors and estrogen levels: Secondary | ICD-10-CM | POA: Diagnosis not present

## 2021-02-26 DIAGNOSIS — C61 Malignant neoplasm of prostate: Secondary | ICD-10-CM | POA: Diagnosis not present

## 2021-02-27 DIAGNOSIS — Z85820 Personal history of malignant melanoma of skin: Secondary | ICD-10-CM | POA: Diagnosis not present

## 2021-02-27 DIAGNOSIS — D485 Neoplasm of uncertain behavior of skin: Secondary | ICD-10-CM | POA: Diagnosis not present

## 2021-02-27 DIAGNOSIS — L308 Other specified dermatitis: Secondary | ICD-10-CM | POA: Diagnosis not present

## 2021-02-27 DIAGNOSIS — L821 Other seborrheic keratosis: Secondary | ICD-10-CM | POA: Diagnosis not present

## 2021-02-27 DIAGNOSIS — Z85828 Personal history of other malignant neoplasm of skin: Secondary | ICD-10-CM | POA: Diagnosis not present

## 2021-02-27 DIAGNOSIS — L820 Inflamed seborrheic keratosis: Secondary | ICD-10-CM | POA: Diagnosis not present

## 2021-03-10 ENCOUNTER — Ambulatory Visit: Payer: PPO

## 2021-03-24 ENCOUNTER — Other Ambulatory Visit: Payer: Self-pay

## 2021-03-24 ENCOUNTER — Ambulatory Visit (INDEPENDENT_AMBULATORY_CARE_PROVIDER_SITE_OTHER): Payer: PPO | Admitting: General Practice

## 2021-03-24 DIAGNOSIS — Z7901 Long term (current) use of anticoagulants: Secondary | ICD-10-CM | POA: Diagnosis not present

## 2021-03-24 LAB — POCT INR: INR: 2.5 (ref 2.0–3.0)

## 2021-03-24 NOTE — Patient Instructions (Signed)
Pre visit review using our clinic review tool, if applicable. No additional management support is needed unless otherwise documented below in the visit note.  Continue to take 1 pill (5mg ) daily EXCEPT for 1/2 pill on Sundays and Wednesdays.  Recheck in 6 to 8 weeks.

## 2021-03-25 ENCOUNTER — Telehealth: Payer: Self-pay | Admitting: Pharmacist

## 2021-03-25 NOTE — Progress Notes (Deleted)
Chronic Care Management Pharmacy Note  03/25/2021 Name:  HEZAKIAH CHAMPEAU MRN:  [id number] DOB:  Jan 31, 1946  Summary: ***  Recommendations/Changes made from today's visit: ***  Plan: ***   Subjective: TOSHIO SLUSHER is an 75 y.o. year old male who is a primary patient of Laurey Morale, MD.  The CCM team was consulted for assistance with disease management and care coordination needs.    Engaged with patient by telephone for follow up visit in response to provider referral for pharmacy case management and/or care coordination services.   Consent to Services:  The patient was given information about Chronic Care Management services, agreed to services, and gave verbal consent prior to initiation of services.  Please see initial visit note for detailed documentation.   Patient Care Team: Laurey Morale, MD as PCP - General (Family Medicine) Earnie Larsson, [facility] as Pharmacist (Pharmacist)  Recent office visits: 03/24/21 Meriam Sprague, RN: Patient presented for anticoag visit. INR 2.5, goal 2-3. Continued 2.5 mg (5 mg x 0.5) every Sun, Wed; 5 mg (5 mg x 1) all other days.   01/04/2020- Alysia Penna, MD- Patient presented for office visit for well exam. Patient reported fatigue and weight gain since starting Eligard injections (managed by oncology).  Recent consult visits: 02/26/21 Curly Shores, NP (oncology): Patient presented for follow up of prostate cancer off ADT since May 2021, last seen 6 months ago.  01/13/21-Bowen, Bradley, MD (ophthalmology): Unable to access notes.  01/07/21 Doreatha Martin, MD (phys medicine & rehab): Unable to access notes.  11/07/20-Goodrich, Robert, MD (dermatology): Unable to access notes.  10/21/20-Jones, Daniel, MD (dermatology): Unable to access notes.  Hospital visits: None in previous 6 months   Objective:  Lab Results  Component Value Date   CREATININE 0.93 11/25/2016   BUN 24 (H) 11/25/2016   GFR 84.69 11/25/2016   GFRNONAA 83 (L)  01/20/2015   GFRAA >90 01/20/2015   NA 138 11/25/2016   K 4.0 11/25/2016   CALCIUM 9.1 11/25/2016   CO2 29 11/25/2016   GLUCOSE 100 (H) 11/25/2016    Lab Results  Component Value Date/Time   GFR 84.69 11/25/2016 08:43 AM   GFR 76.34 11/28/2015 10:09 AM    Last diabetic Eye exam: No results found for: HMDIABEYEEXA  Last diabetic Foot exam: No results found for: HMDIABFOOTEX   Lab Results  Component Value Date   CHOL 170 11/25/2016   HDL 37.20 (L) 11/25/2016   LDLCALC 102 (H) 11/25/2016   LDLDIRECT 118.3 07/28/2011   TRIG 152.0 (H) 11/25/2016   CHOLHDL 5 11/25/2016    Hepatic Function Latest Ref Rng & Units 11/25/2016 11/28/2015 10/25/2014  Total Protein 6.0 - 8.3 g/dL 6.6 6.8 6.9  Albumin 3.5 - 5.2 g/dL 4.1 4.1 4.0  AST 0 - 37 U/L _0 ALT 0 - 53 U/L _1 Alk Phosphatase 39 - 117 U/L 50 48 56  Total Bilirubin 0.2 - 1.2 mg/dL 0.5 0.8 0.6  Bilirubin, Direct 0.0 - 0.3 mg/dL 0.1 0.1 0.2    Lab Results  Component Value Date/Time   TSH 4.06 11/25/2016 08:43 AM   TSH 3.83 11/28/2015 10:09 AM    CBC Latest Ref Rng & Units 07/22/2017 11/25/2016 11/28/2015  WBC 4.0 - 10.5 K/uL 4.1 4.6 6.3  Hemoglobin 13.0 - 17.0 g/dL 16.3 15.9 16.7  Hematocrit 39.0 - 52.0 % 45.9 47.0 50.7  Platelets 150 - 400 K/uL 172 207.0 206.0    No results found for: VD25OH  Clinical ASCVD: {YES/NO:21197} The ASCVD Risk score Mikey Bussing DC Jr., et al., 2013) failed to calculate for the following reasons:   Cannot find a previous HDL lab   Cannot find a previous total cholesterol lab    Depression screen [facility] 2/9 01/04/2020 12/02/2016 12/02/2016  Decreased Interest 0 0 0  Down, Depressed, Hopeless 0 0 0  PHQ - 2 Score 0 0 0     ***Other: (CHADS2VASc if Afib, MMRC or CAT for COPD, ACT, DEXA)  Social History   Tobacco Use  Smoking Status Former Smoker  . Types: Cigars  . Quit date: 11/27/1968  . Years since quitting: 52.3  Smokeless Tobacco Never Used  Tobacco Comment   every 3  weeks socially     BP Readings from Last 3 Encounters:  01/04/20 140/80  01/25/19 (!) 165/93  07/31/17 116/70   Pulse Readings from Last 3 Encounters:  01/04/20 60  01/25/19 63  07/31/17 (!) 59   Wt Readings from Last 3 Encounters:  01/04/20 191 lb 6.4 oz (86.8 kg)  07/29/17 182 lb (82.6 kg)  07/22/17 182 lb 6.4 oz (82.7 kg)   BMI Readings from Last 3 Encounters:  01/04/20 27.46 kg/m  07/29/17 26.11 kg/m  07/22/17 26.17 kg/m    Assessment/Interventions: Review of patient past medical history, allergies, medications, health status, including review of consultants reports, laboratory and other test data, was performed as part of comprehensive evaluation and provision of chronic care management services.   SDOH:  (Social Determinants of Health) assessments and interventions performed: {yes/no:20286}  SDOH Screenings   Alcohol Screen: Not on file  Depression (AJO8-7): Not on file  Financial Resource Strain: Not on file  Food Insecurity: Not on file  Housing: Not on file  Physical Activity: Not on file  Social Connections: Not on file  Stress: Not on file  Tobacco Use: Not on file  Transportation Needs: Not on file    [place]  No Known Allergies  Medications Reviewed Today    Reviewed by Earnie Larsson, [facility] (Pharmacist) on 02/22/20 at 1120  Med List Status: <None>  Medication Order Taking? Sig Documenting Provider Last Dose Status Informant  famotidine (PEPCID) 20 MG tablet [id number] Yes Take 10 mg by mouth 2 (two) times daily.  [provider] Taking Active Self  fluorometholone (FML) 0.1 % ophthalmic suspension [id number] No INT 1 GTT IN OD QOD [provider] Not Taking Consider Medication Status and Discontinue   gatifloxacin (ZYMAXID) 0.5 % SOLN [id number] Yes INSTILL 1 DROP INTO AFFECTED EYE(S) 4 TIMES A DAY FOR 10 DAYS [provider] Taking Active   predniSONE (DELTASONE) 20 MG tablet [id number] Yes Take 5-10 mg by mouth daily as needed (for  rash). [provider] Taking Active Self  valACYclovir (VALTREX) 1000 MG tablet [id number] Yes Take 1,000 mg by mouth 3 (three) times daily. [provider] Taking Active   warfarin (COUMADIN) 5 MG tablet [id number] Yes Take 1 tablet daily except take 1/2 on Sun and Wed or TAKE AS DIRECTED BY COUMADIN CLINIC Laurey Morale, MD Taking Active           Patient Active Problem List   Diagnosis Date Noted  . Incarcerated ventral hernia 07/29/2017  . Long term (current) use of anticoagulants 07/26/2017  . Pulmonary embolism without acute cor pulmonale ([place]) 12/14/2016  . Cough 12/29/2014  . 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 Encounter for therapeutic drug monitoring 12/26/2013  . Prostate cancer ([place])   . Viral URI 09/09/2011  .  History of coagulopathy 08/20/2011  . Pulmonary embolism ([place]) 12/08/2010  . CHEST PAIN 07/01/2010  . Other dyspnea and respiratory abnormality 05/01/2010  . GERD 04/25/2010  . PULMONARY EMBOLISM, HX OF 04/25/2010  . PNEUMONIA, HX OF 04/25/2010    Immunization History  Administered Date(s) Administered  . Fluad Quad(high Dose 65+) 07/10/2019, 10/07/2020  . Influenza Split 07/28/2011  . Influenza Whole 07/21/2010  . Influenza, High Dose Seasonal PF 08/15/2013, 07/30/2017, 08/24/2018  . Influenza-Unspecified 09/28/2014, 09/20/2015, 08/19/2016, 08/28/2016  . PFIZER(Purple Top)SARS-COV-2 Vaccination 11/03/2019, 11/22/2019, 07/25/2020  . Pneumococcal Conjugate-13 10/25/2014  . Pneumococcal Polysaccharide-23 07/28/2011  . Tdap 08/20/2011    Conditions to be addressed/monitored:  GERD and History of PE  There are no care plans that you recently modified to display for this patient.   Current Barriers:  . {pharmacybarriers:24917}  Pharmacist Clinical Goal(s):  Marland Kitchen Patient will {PHARMACYGOALCHOICES:24921} through collaboration with PharmD and provider.   Interventions: . 1:1 collaboration with Laurey Morale, MD regarding development and update  of comprehensive plan of care as evidenced by provider attestation and co-signature . Inter-disciplinary care team collaboration (see longitudinal plan of care) . Comprehensive medication review performed; medication list updated in electronic medical record  History of PE (Goal: prevent blood clots and major bleeding) -{US controlled/uncontrolled:25276} -Current treatment: . Anticoagulation: warfarin as directed by the coumadin clinic -Medications previously tried: *** -Counseled on {CCMAFIBCOUNSELING:25120} -{CCMPHARMDINTERVENTION:25122}  GERD (Goal: ***) -{US controlled/uncontrolled:25276} -Current treatment   Famotidine (Pepcid) 50m, 1 tablet twice daily -Medications previously tried: ***  -{CCMPHARMDINTERVENTION:25122}  Non-pharmacologic management of symptoms such as elevating the head of your bed, avoiding eating 2-3 hours before bed, avoiding triggering foods such as acidic, spicy, or fatty foods, eating smaller meals, and wearing clothes that are loose around the waist  Vaccines   Reviewed and discussed patient's vaccination history.    Immunization History  Administered Date(s) Administered  . Fluad Quad(high Dose 65+) 07/10/2019, 10/07/2020  . Influenza Split 07/28/2011  . Influenza Whole 07/21/2010  . Influenza, High Dose Seasonal PF 08/15/2013, 07/30/2017, 08/24/2018  . Influenza-Unspecified 09/28/2014, 09/20/2015, 08/19/2016, 08/28/2016  . PFIZER(Purple Top)SARS-COV-2 Vaccination 11/03/2019, 11/22/2019, 07/25/2020  . Pneumococcal Conjugate-13 10/25/2014  . Pneumococcal Polysaccharide-23 07/28/2011  . Tdap 08/20/2011    Plan  Recommended patient receive *** vaccine in *** office.    Health Maintenance -Vaccine gaps: shingrix, COVID booster -Current therapy:   Gatifloxacin 0.5% solution, 1 drop in affected eye(s) four times a day for 10 days (patient reports taking ~2 years due to infection in right eye. He states he discussed with eye doc and will be  staying on it. F/u visit in 2-3 months)  Valacyclovir (Valtrex) 10019m 1 tablet three times daily. (also takes for his eye infection)    Prednisone 2069mtake 5 to 60m31mily as needed for rash  -Educated on {ccm supplement counseling:25128} -{CCM Patient satisfied:25129} -{CCMPHARMDINTERVENTION:25122}   Patient Goals/Self-Care Activities . Patient will:  - {pharmacypatientgoals:24919}  Follow Up Plan: {CM FOLLOW UP PLANBHAL:93790}Medication Assistance: {MEDASSISTANCEINFO:25044}  Compliance/Adherence/Medication fill history: Care Gaps: ***  Star-Rating Drugs: ***  Patient's preferred pharmacy is:  [id number] [place] [address] [place] [id number]: [phone] pill box? {Yes or If no, why not?:20788} Pt endorses ***% compliance  We discussed: {Pharmacy options:24294} Patient decided to: {[facility] and Follow Up Patient Decision:  {FOLLOWUP:24991}  Plan: {CM FOLLOW UP PLAN:HERD:40814}  Jeni Salles, PharmD, [place] Pharmacist [place] at [place]

## 2021-03-25 NOTE — Chronic Care Management (AMB) (Signed)
Date- Patient called to remind of appointment with Watt Climes on 03/26/2021 at 8:30 am  Patient aware of appointment date, time, and type of appointment (telephone). Patient aware to have/bring all medications, supplements, blood pressure and/or blood sugar logs to visit.  Questions: Have you had any recent office visit or specialist visit outside of [place]? Yes, Oncology in [place]  Are there any concerns you would like to discuss during your office visit? No  Are you having any problems obtaining your medications? (Whether it pharmacy issues or cost) No  Star Rating Drug: None  Any gaps in medications fill history? No  Maia Breslow, [place] Pharmacist Assistant [phone]

## 2021-03-26 ENCOUNTER — Telehealth: Payer: PPO

## 2021-03-26 ENCOUNTER — Telehealth: Payer: Self-pay | Admitting: Pharmacist

## 2021-03-26 NOTE — Telephone Encounter (Signed)
  Chronic Care Management   Outreach Note  03/26/2021 Name: TINA TEMME MRN: [id number] DOB: [birth year]  Referred by: Laurey Morale, MD  Patient had a phone appointment scheduled with clinical pharmacist today.  Patient was unavailable. Rescheduled appointment to tomorrow 03/27/21 @ 8:30 am. Patient's wife wrote it down.   Jeni Salles, PharmD, [place] Pharmacist [place] at [place]

## 2021-03-27 ENCOUNTER — Ambulatory Visit (INDEPENDENT_AMBULATORY_CARE_PROVIDER_SITE_OTHER): Payer: PPO | Admitting: Pharmacist

## 2021-03-27 DIAGNOSIS — J45909 Unspecified asthma, uncomplicated: Secondary | ICD-10-CM | POA: Diagnosis not present

## 2021-03-27 DIAGNOSIS — I2699 Other pulmonary embolism without acute cor pulmonale: Secondary | ICD-10-CM

## 2021-03-27 DIAGNOSIS — Z7901 Long term (current) use of anticoagulants: Secondary | ICD-10-CM

## 2021-03-27 DIAGNOSIS — K219 Gastro-esophageal reflux disease without esophagitis: Secondary | ICD-10-CM

## 2021-03-27 NOTE — Patient Instructions (Signed)
Hi Al,  It was great to get to meet you over the telephone! Below is a summary of some of the topics we discussed.   Please reach out to me if you have any questions or need anything before our follow up!  Best, Maddie  Jeni Salles, PharmD, [place] at [place] [phone]   Visit Information   Goals Addressed   None    Patient Care Plan: CCM Pharmacy Care Plan     Problem Identified: Problem: History of PE and GERD      Long-Range Goal: Patient-Specific Goal   Start Date: 03/27/2021  Expected End Date: 02/24/2022  This Visit's Progress: On track  Priority: High  Note:   Current Barriers:  Unable to independently monitor therapeutic efficacy  Pharmacist Clinical Goal(s):  Patient will achieve adherence to monitoring guidelines and medication adherence to achieve therapeutic efficacy through collaboration with PharmD and provider.   Interventions: 1:1 collaboration with Laurey Morale, MD regarding development and update of comprehensive plan of care as evidenced by provider attestation and co-signature Inter-disciplinary care team collaboration (see longitudinal plan of care) Comprehensive medication review performed; medication list updated in electronic medical record  History of PE (Goal: prevent blood clots and major bleeding) -Controlled -Current treatment: Anticoagulation: warfarin as directed by the coumadin clinic -Medications previously tried: none -Counseled on avoidance of NSAIDs due to increased bleeding risk with anticoagulants; importance of regular laboratory monitoring; -Recommended to continue current medication  GERD (Goal: minimize symptoms) -Controlled -Current treatment  Famotidine (Pepcid) 20mg , 1 tablet twice daily -Medications previously tried: none  -Counseled on non-pharmacologic management of symptoms such as elevating the head of your bed, avoiding eating 2-3 hours before bed, avoiding  triggering foods such as acidic, spicy, or fatty foods, eating smaller meals, and wearing clothes that are loose around the waist  Health Maintenance -Vaccine gaps: shingrix, COVID booster -Current therapy:  Valacyclovir 500 mg as needed -Educated on Cost vs benefit of each product must be carefully weighed by individual consumer -Patient is satisfied with current therapy and denies issues -Recommended to continue current medication  Patient Goals/Self-Care Activities Patient will:  - take medications as prescribed  Follow Up Plan: Telephone follow up appointment with care management team member scheduled for: 1 year       Patient verbalizes understanding of instructions provided today and agrees to view in [place].  Telephone follow up appointment with pharmacy team member scheduled for: 1 year  Viona Gilmore, [facility]

## 2021-03-27 NOTE — Progress Notes (Signed)
Chronic Care Management Pharmacy Note  03/27/2021 Name:  Donald Villegas MRN:  [id number] DOB:  Jan 01, 1946  Summary: GERD symptoms controlled No signs of blood clots or bleeding with anticoagulation  Recommendations/Changes made from today's visit: -Recommended for patient to get necessary vaccinations to be up to date  Plan: Follow up in 1 year   Subjective: Donald Villegas is an 75 y.o. year old male who is a primary patient of Laurey Morale, MD.  The CCM team was consulted for assistance with disease management and care coordination needs.    Engaged with patient by telephone for follow up visit in response to provider referral for pharmacy case management and/or care coordination services.   Consent to Services:  The patient was given information about Chronic Care Management services, agreed to services, and gave verbal consent prior to initiation of services.  Please see initial visit note for detailed documentation.   Patient Care Team: Laurey Morale, MD as PCP - General (Family Medicine) Earnie Larsson, [facility] as Pharmacist (Pharmacist)  Recent office visits: 03/24/21 Meriam Sprague, RN: Patient presented for anticoag visit. INR 2.5, goal 2-3. Continued 2.5 mg (5 mg x 0.5) every Sun, Wed; 5 mg (5 mg x 1) all other days.   01/04/2020- Alysia Penna, MD- Patient presented for office visit for well exam. Patient reported fatigue and weight gain since starting Eligard injections (managed by oncology).  Recent consult visits: 02/26/21 Curly Shores, NP (oncology): Patient presented for follow up of prostate cancer off ADT since May 2021, last seen 6 months ago.  01/13/21-Bowen, Bradley, MD (ophthalmology): Unable to access notes.  01/07/21 Doreatha Martin, MD (phys medicine & rehab): Unable to access notes.  11/07/20-Goodrich, Robert, MD (dermatology): Unable to access notes.  10/21/20-Jones, Daniel, MD (dermatology): Unable to access notes.  Hospital visits: None in  previous 6 months   Objective:  Lab Results  Component Value Date   CREATININE 0.93 11/25/2016   BUN 24 (H) 11/25/2016   GFR 84.69 11/25/2016   GFRNONAA 83 (L) 01/20/2015   GFRAA >90 01/20/2015   NA 138 11/25/2016   K 4.0 11/25/2016   CALCIUM 9.1 11/25/2016   CO2 29 11/25/2016   GLUCOSE 100 (H) 11/25/2016    Lab Results  Component Value Date/Time   GFR 84.69 11/25/2016 08:43 AM   GFR 76.34 11/28/2015 10:09 AM    Last diabetic Eye exam: No results found for: HMDIABEYEEXA  Last diabetic Foot exam: No results found for: HMDIABFOOTEX   Lab Results  Component Value Date   CHOL 170 11/25/2016   HDL 37.20 (L) 11/25/2016   LDLCALC 102 (H) 11/25/2016   LDLDIRECT 118.3 07/28/2011   TRIG 152.0 (H) 11/25/2016   CHOLHDL 5 11/25/2016    Hepatic Function Latest Ref Rng & Units 11/25/2016 11/28/2015 10/25/2014  Total Protein 6.0 - 8.3 g/dL 6.6 6.8 6.9  Albumin 3.5 - 5.2 g/dL 4.1 4.1 4.0  AST 0 - 37 U/L 17 20 22   ALT 0 - 53 U/L 17 18 20   Alk Phosphatase 39 - 117 U/L 50 48 56  Total Bilirubin 0.2 - 1.2 mg/dL 0.5 0.8 0.6  Bilirubin, Direct 0.0 - 0.3 mg/dL 0.1 0.1 0.2    Lab Results  Component Value Date/Time   TSH 4.06 11/25/2016 08:43 AM   TSH 3.83 11/28/2015 10:09 AM    CBC Latest Ref Rng & Units 07/22/2017 11/25/2016 11/28/2015  WBC 4.0 - 10.5 K/uL 4.1 4.6 6.3  Hemoglobin 13.0 - 17.0 g/dL 16.3 15.9 16.7  Hematocrit 39.0 - 52.0 % 45.9 47.0 50.7  Platelets 150 - 400 K/uL 172 207.0 206.0    No results found for: VD25OH  Clinical ASCVD: No  The ASCVD Risk score Mikey Bussing DC Jr., et al., 2013) failed to calculate for the following reasons:   Cannot find a previous HDL lab   Cannot find a previous total cholesterol lab    Depression screen [facility] 2/9 01/04/2020 12/02/2016 12/02/2016  Decreased Interest 0 0 0  Down, Depressed, Hopeless 0 0 0  PHQ - 2 Score 0 0 0     Social History   Tobacco Use  Smoking Status Former   Pack years: 0.00   Types: Cigars   Quit date: 11/27/1968   Years  since quitting: 52.3  Smokeless Tobacco Never  Tobacco Comments   every 3  weeks socially    BP Readings from Last 3 Encounters:  01/04/20 140/80  01/25/19 (!) 165/93  07/31/17 116/70   Pulse Readings from Last 3 Encounters:  01/04/20 60  01/25/19 63  07/31/17 (!) 59   Wt Readings from Last 3 Encounters:  01/04/20 191 lb 6.4 oz (86.8 kg)  07/29/17 182 lb (82.6 kg)  07/22/17 182 lb 6.4 oz (82.7 kg)   BMI Readings from Last 3 Encounters:  01/04/20 27.46 kg/m  07/29/17 26.11 kg/m  07/22/17 26.17 kg/m    Assessment/Interventions: Review of patient past medical history, allergies, medications, health status, including review of consultants reports, laboratory and other test data, was performed as part of comprehensive evaluation and provision of chronic care management services.   SDOH:  (Social Determinants of Health) assessments and interventions performed: No  SDOH Screenings   Alcohol Screen: Not on file  Depression (MAU6-3): Not on file  Financial Resource Strain: Not on file  Food Insecurity: Not on file  Housing: Not on file  Physical Activity: Not on file  Social Connections: Not on file  Stress: Not on file  Tobacco Use: Not on file  Transportation Needs: Not on file    [place]  No Known Allergies  Medications Reviewed Today     Reviewed by Viona Gilmore, [facility] (Pharmacist) on 03/27/21 at [phone]  Med List Status: <None>   Medication Order Taking? Sig Documenting Provider Last Dose Status Informant  famotidine (PEPCID) 20 MG tablet [id number] Yes Take 10 mg by mouth as needed. [provider] Taking Active Self  valACYclovir (VALTREX) 1000 MG tablet [id number] No Take 1,000 mg by mouth 3 (three) times daily.  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   Encounter for therapeutic drug monitoring 12/26/2013   Prostate cancer ([place])    Viral URI 09/09/2011   History of coagulopathy 08/20/2011   Pulmonary embolism ([place]) 12/08/2010   CHEST PAIN 07/01/2010   Other dyspnea and respiratory abnormality 05/01/2010   GERD 04/25/2010   PULMONARY EMBOLISM, HX OF 04/25/2010   PNEUMONIA, HX OF 04/25/2010    Immunization History  Administered Date(s) Administered   Fluad Quad(high Dose 65+) 07/10/2019, 10/07/2020   Influenza Split 07/28/2011   Influenza Whole 07/21/2010   Influenza, High Dose Seasonal PF 08/15/2013, 07/30/2017, 08/24/2018   Influenza-Unspecified 09/28/2014, 09/20/2015, 08/19/2016, 08/28/2016   PFIZER(Purple Top)SARS-COV-2 Vaccination 11/03/2019, 11/22/2019, 07/25/2020   Pneumococcal Conjugate-13 10/25/2014   Pneumococcal Polysaccharide-23 07/28/2011   Tdap 08/20/2011    Conditions to be addressed/monitored:  GERD and History of PE  Care Plan : [place]  Updates made by Viona Gilmore, [place] since 03/27/2021 12:00 AM     Problem: Problem: History of PE and GERD      Long-Range Goal: Patient-Specific Goal   Start Date: 03/27/2021  Expected End Date: 02/24/2022  This Visit's Progress: On track  Priority: High  Note:   Current Barriers:  Unable to independently monitor therapeutic efficacy  Pharmacist Clinical Goal(s):  Patient will achieve adherence to monitoring guidelines and medication adherence to achieve therapeutic efficacy through collaboration with PharmD and provider.   Interventions: 1:1 collaboration with Laurey Morale, MD regarding development and update of comprehensive plan of care as  evidenced by provider attestation and co-signature Inter-disciplinary care team collaboration (see longitudinal plan of care) Comprehensive medication review performed; medication list updated in electronic medical record  History of PE (Goal: prevent blood clots and major bleeding) -Controlled -Current treatment: Anticoagulation: warfarin as directed by the coumadin clinic -Medications previously tried: none -Counseled on avoidance of NSAIDs due to increased bleeding risk with anticoagulants; importance of regular laboratory monitoring; -Recommended to continue current medication  GERD (Goal: minimize symptoms) -Controlled -Current treatment  Famotidine (Pepcid) 81m, 1 tablet twice daily -Medications previously tried: none  -Counseled on non-pharmacologic management of symptoms such as elevating the head of your bed, avoiding eating 2-3 hours before bed, avoiding triggering foods such as acidic, spicy, or fatty foods, eating smaller meals, and wearing clothes that are loose around the waist  Health Maintenance -Vaccine gaps: shingrix, COVID booster -Current therapy:  Valacyclovir 500 mg as needed -Educated on Cost vs benefit of each product must be carefully weighed by individual consumer -Patient is satisfied with current therapy and denies issues -Recommended to continue current medication  Patient Goals/Self-Care Activities Patient will:  - take medications as prescribed  Follow Up Plan: Telephone follow up appointment with care management team member scheduled for: 1 year         Medication Assistance: None required.  Patient affirms current coverage meets needs.  Compliance/Adherence/Medication fill history: Care Gaps: Shingrix, COVID booster  Star-Rating Drugs: None  Patient's preferred pharmacy is:  [id number] [place], [place] - [id number] [id number]:  [phone]: [phone] pill box? No - has own system Pt endorses 100% compliance  We discussed: Current pharmacy is preferred with insurance plan and patient is satisfied with pharmacy services Patient decided to: Continue current medication management strategy  Care Plan and Follow Up Patient Decision:  Patient agrees to Care Plan and Follow-up.  Plan: Telephone follow up appointment with care management team member scheduled for:  1 year  MaJeni SallesPharmD, BCSedilloharmacist [employer]Occ[employer] Petroleumt [phone]

## 2021-04-08 DIAGNOSIS — H04122 Dry eye syndrome of left lacrimal gland: Secondary | ICD-10-CM | POA: Diagnosis not present

## 2021-04-10 ENCOUNTER — Other Ambulatory Visit: Payer: Self-pay

## 2021-04-10 ENCOUNTER — Ambulatory Visit: Payer: PPO | Admitting: Podiatry

## 2021-04-10 ENCOUNTER — Ambulatory Visit (INDEPENDENT_AMBULATORY_CARE_PROVIDER_SITE_OTHER): Payer: PPO

## 2021-04-10 DIAGNOSIS — G629 Polyneuropathy, unspecified: Secondary | ICD-10-CM | POA: Diagnosis not present

## 2021-04-10 DIAGNOSIS — M778 Other enthesopathies, not elsewhere classified: Secondary | ICD-10-CM | POA: Diagnosis not present

## 2021-04-10 DIAGNOSIS — M779 Enthesopathy, unspecified: Secondary | ICD-10-CM | POA: Diagnosis not present

## 2021-04-10 MED ORDER — TRIAMCINOLONE ACETONIDE 10 MG/ML IJ SUSP
20.0000 mg | Freq: Once | INTRAMUSCULAR | Status: AC
  Administered 2021-04-10: 20 mg

## 2021-04-10 MED ORDER — GABAPENTIN 100 MG PO CAPS: 100.0000 mg | ORAL_CAPSULE | Freq: Three times a day (TID) | ORAL | 3 refills | Status: DC

## 2021-04-11 NOTE — Progress Notes (Signed)
Subjective:   Patient ID: Donald Villegas, male   DOB: 75 y.o.   MRN: [id number]   HPI Patient presents stating he has been developing increased tingling in his feet at nighttime especially and also has inflammation pain of the outside of both feet with structural changes noted.  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  I then discussed neuropathy educated him on this and treatment options and we have decided on gabapentin that and I wrote him prescription for just 100 mg today with 1 to be taken at night with toleration of 1 in the morning midday.  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

## 2021-04-25 ENCOUNTER — Encounter: Payer: Self-pay | Admitting: Family Medicine

## 2021-04-25 ENCOUNTER — Other Ambulatory Visit: Payer: Self-pay

## 2021-04-25 ENCOUNTER — Ambulatory Visit (INDEPENDENT_AMBULATORY_CARE_PROVIDER_SITE_OTHER): Payer: PPO

## 2021-04-25 ENCOUNTER — Ambulatory Visit (INDEPENDENT_AMBULATORY_CARE_PROVIDER_SITE_OTHER): Payer: PPO | Admitting: Family Medicine

## 2021-04-25 VITALS — BP 124/68 | HR 68 | Temp 97.7°F | Ht 70.0 in | Wt 182.0 lb

## 2021-04-25 VITALS — BP 124/68 | HR 68 | Temp 94.0°F | Ht 70.0 in | Wt 182.0 lb

## 2021-04-25 DIAGNOSIS — Z0000 Encounter for general adult medical examination without abnormal findings: Secondary | ICD-10-CM | POA: Diagnosis not present

## 2021-04-25 LAB — BASIC METABOLIC PANEL
BUN: 25 mg/dL — ABNORMAL HIGH (ref 6–23)
CO2: 27 mEq/L (ref 19–32)
Calcium: 9.5 mg/dL (ref 8.4–10.5)
Chloride: 103 mEq/L (ref 96–112)
Creatinine, Ser: 0.92 mg/dL (ref 0.40–1.50)
GFR: 80.4 mL/min (ref >60.00)
Glucose, Bld: 86 mg/dL (ref 70–99)
Potassium: 4.1 mEq/L (ref 3.5–5.1)
Sodium: 139 mEq/L (ref 135–145)

## 2021-04-25 LAB — VITAMIN B12: Vitamin B-12: 258 pg/mL (ref 211–911)

## 2021-04-25 LAB — T4, FREE: Free T4: 0.73 ng/dL (ref 0.60–1.60)

## 2021-04-25 LAB — HEPATIC FUNCTION PANEL
ALT: 14 U/L (ref 0–53)
AST: 17 U/L (ref 0–37)
Albumin: 4.2 g/dL (ref 3.5–5.2)
Alkaline Phosphatase: 55 U/L (ref 39–117)
Bilirubin, Direct: 0.2 mg/dL (ref 0.0–0.3)
Total Bilirubin: 1 mg/dL (ref 0.2–1.2)
Total Protein: 6.9 g/dL (ref 6.0–8.3)

## 2021-04-25 LAB — CBC WITH DIFFERENTIAL/PLATELET
Basophils Absolute: 0.1 10*3/uL (ref 0.0–0.1)
Basophils Relative: 1.3 % (ref 0.0–3.0)
Eosinophils Absolute: 0.2 10*3/uL (ref 0.0–0.7)
Eosinophils Relative: 2.6 % (ref 0.0–5.0)
HCT: 47.3 % (ref 39.0–52.0)
Hemoglobin: 15.9 g/dL (ref 13.0–17.0)
Lymphocytes Relative: 19.5 % (ref 12.0–46.0)
Lymphs Abs: 1.2 10*3/uL (ref 0.7–4.0)
MCHC: 33.5 g/dL (ref 30.0–36.0)
MCV: 103 fl — ABNORMAL HIGH (ref 78.0–100.0)
Monocytes Absolute: 0.6 10*3/uL (ref 0.1–1.0)
Monocytes Relative: 10.3 % (ref 3.0–12.0)
Neutro Abs: 3.9 10*3/uL (ref 1.4–7.7)
Neutrophils Relative %: 66.3 % (ref 43.0–77.0)
Platelets: 171 10*3/uL (ref 150.0–400.0)
RBC: 4.59 Mil/uL (ref 4.22–5.81)
RDW: 15 % (ref 11.5–15.5)
WBC: 6 10*3/uL (ref 4.0–10.5)

## 2021-04-25 LAB — LIPID PANEL
Cholesterol: 171 mg/dL (ref 0–200)
HDL: 39.4 mg/dL (ref >39.00)
LDL Cholesterol: 106 mg/dL — ABNORMAL HIGH (ref 0–99)
NonHDL: 131.39
Total CHOL/HDL Ratio: 4
Triglycerides: 126 mg/dL (ref 0.0–149.0)
VLDL: 25.2 mg/dL (ref 0.0–40.0)

## 2021-04-25 LAB — HEMOGLOBIN A1C: Hgb A1c MFr Bld: 6 % (ref 4.6–6.5)

## 2021-04-25 LAB — TSH: TSH: 3.68 u[IU]/mL (ref 0.35–5.50)

## 2021-04-25 LAB — T3, FREE: T3, Free: 3.4 pg/mL (ref 2.3–4.2)

## 2021-04-25 MED ORDER — LUPRON DEPOT (6-MONTH) 45 MG IM KIT: 45.0000 mg | PACK | INTRAMUSCULAR | 1 refills | Status: AC

## 2021-04-25 NOTE — Patient Instructions (Signed)
[name] , Thank you for taking time to come for your Medicare Wellness Visit. I appreciate your ongoing commitment to your health goals. Please review the following plan we discussed and let me know if I can assist you in the future.   Screening recommendations/referrals: Colonoscopy: no longer required  Recommended yearly ophthalmology/optometry visit for glaucoma screening and checkup Recommended yearly dental visit for hygiene and checkup  Vaccinations: Influenza vaccine: due in fall 2022 Pneumococcal vaccine: completed series  Tdap vaccine: currently due  Shingles vaccine: will obtain local pharmacy   Advanced directives: will provide copies   Conditions/risks identified: none   Next appointment: none   Preventive Care 5 Years and Older, Male Preventive care refers to lifestyle choices and visits with your health care provider that can promote health and wellness. What does preventive care include? A yearly physical exam. This is also called an annual well check. Dental exams once or twice a year. Routine eye exams. Ask your health care provider how often you should have your eyes checked. Personal lifestyle choices, including: Daily care of your teeth and gums. Regular physical activity. Eating a healthy diet. Avoiding tobacco and drug use. Limiting alcohol use. Practicing safe sex. Taking low doses of aspirin every day. Taking vitamin and mineral supplements as recommended by your health care provider. What happens during an annual well check? The services and screenings done by your health care provider during your annual well check will depend on your age, overall health, lifestyle risk factors, and family history of disease. Counseling  Your health care provider may ask you questions about your: Alcohol use. Tobacco use. Drug use. Emotional well-being. Home and relationship well-being. Sexual activity. Eating habits. History of falls. Memory and ability to  understand (cognition). Work and work Statistician. Screening  You may have the following tests or measurements: Height, weight, and BMI. Blood pressure. Lipid and cholesterol levels. These may be checked every 5 years, or more frequently if you are over 66 years old. Skin check. Lung cancer screening. You may have this screening every year starting at age 55 if you have a 30-pack-year history of smoking and currently smoke or have quit within the past 15 years. Fecal occult blood test (FOBT) of the stool. You may have this test every year starting at age 96. Flexible sigmoidoscopy or colonoscopy. You may have a sigmoidoscopy every 5 years or a colonoscopy every 10 years starting at age 53. Prostate cancer screening. Recommendations will vary depending on your family history and other risks. Hepatitis C blood test. Hepatitis B blood test. Sexually transmitted disease (STD) testing. Diabetes screening. This is done by checking your blood sugar (glucose) after you have not eaten for a while (fasting). You may have this done every 1-3 years. Abdominal aortic aneurysm (AAA) screening. You may need this if you are a current or former smoker. Osteoporosis. You may be screened starting at age 32 if you are at high risk. Talk with your health care provider about your test results, treatment options, and if necessary, the need for more tests. Vaccines  Your health care provider may recommend certain vaccines, such as: Influenza vaccine. This is recommended every year. Tetanus, diphtheria, and acellular pertussis (Tdap, Td) vaccine. You may need a Td booster every 10 years. Zoster vaccine. You may need this after age 92. Pneumococcal 13-valent conjugate (PCV13) vaccine. One dose is recommended after age 42. Pneumococcal polysaccharide (PPSV23) vaccine. One dose is recommended after age 68. Talk to your health care provider about  which screenings and vaccines you need and how often you need them. This  information is not intended to replace advice given to you by your health care provider. Make sure you discuss any questions you have with your health care provider. Document Released: 11/01/2015 Document Revised: 06/24/2016 Document Reviewed: 08/06/2015 Elsevier Interactive Patient Education  2017 [place] Prevention in the Home Falls can cause injuries. They can happen to people of all ages. There are many things you can do to make your home safe and to help prevent falls. What can I do on the outside of my home? Regularly fix the edges of walkways and driveways and fix any cracks. Remove anything that might make you trip as you walk through a door, such as a raised step or threshold. Trim any bushes or trees on the path to your home. Use bright outdoor lighting. Clear any walking paths of anything that might make someone trip, such as rocks or tools. Regularly check to see if handrails are loose or broken. Make sure that both sides of any steps have handrails. Any raised decks and porches should have guardrails on the edges. Have any leaves, snow, or ice cleared regularly. Use sand or salt on walking paths during winter. Clean up any spills in your garage right away. This includes oil or grease spills. What can I do in the bathroom? Use night lights. Install grab bars by the toilet and in the tub and shower. Do not use towel bars as grab bars. Use non-skid mats or decals in the tub or shower. If you need to sit down in the shower, use a plastic, non-slip stool. Keep the floor dry. Clean up any water that spills on the floor as soon as it happens. Remove soap buildup in the tub or shower regularly. Attach bath mats securely with double-sided non-slip rug tape. Do not have throw rugs and other things on the floor that can make you trip. What can I do in the bedroom? Use night lights. Make sure that you have a light by your bed that is easy to reach. Do not use any sheets or  blankets that are too big for your bed. They should not hang down onto the floor. Have a firm chair that has side arms. You can use this for support while you get dressed. Do not have throw rugs and other things on the floor that can make you trip. What can I do in the kitchen? Clean up any spills right away. Avoid walking on wet floors. Keep items that you use a lot in easy-to-reach places. If you need to reach something above you, use a strong step stool that has a grab bar. Keep electrical cords out of the way. Do not use floor polish or wax that makes floors slippery. If you must use wax, use non-skid floor wax. Do not have throw rugs and other things on the floor that can make you trip. What can I do with my stairs? Do not leave any items on the stairs. Make sure that there are handrails on both sides of the stairs and use them. Fix handrails that are broken or loose. Make sure that handrails are as long as the stairways. Check any carpeting to make sure that it is firmly attached to the stairs. Fix any carpet that is loose or worn. Avoid having throw rugs at the top or bottom of the stairs. If you do have throw rugs, attach them to the floor with  carpet tape. Make sure that you have a light switch at the top of the stairs and the bottom of the stairs. If you do not have them, ask someone to add them for you. What else can I do to help prevent falls? Wear shoes that: Do not have high heels. Have rubber bottoms. Are comfortable and fit you well. Are closed at the toe. Do not wear sandals. If you use a stepladder: Make sure that it is fully opened. Do not climb a closed stepladder. Make sure that both sides of the stepladder are locked into place. Ask someone to hold it for you, if possible. Clearly mark and make sure that you can see: Any grab bars or handrails. First and last steps. Where the edge of each step is. Use tools that help you move around (mobility aids) if they are  needed. These include: Canes. Walkers. Scooters. Crutches. Turn on the lights when you go into a dark area. Replace any light bulbs as soon as they burn out. Set up your furniture so you have a clear path. Avoid moving your furniture around. If any of your floors are uneven, fix them. If there are any pets around you, be aware of where they are. Review your medicines with your doctor. Some medicines can make you feel dizzy. This can increase your chance of falling. Ask your doctor what other things that you can do to help prevent falls. This information is not intended to replace advice given to you by your health care provider. Make sure you discuss any questions you have with your health care provider. Document Released: 08/01/2009 Document Revised: 03/12/2016 Document Reviewed: 11/09/2014 Elsevier Interactive Patient Education  2017 [employer].

## 2021-04-25 NOTE — Progress Notes (Signed)
Subjective:    Patient ID: Donald Villegas, male    DOB: [birth year], 74 y.o.   MRN: [id number]  HPI Here for a well exam. He feels well except for some generalized fatigue which is likely a side effect of Lupron. He sees Dance movement psychotherapist and they check his PSA every 6 months. When it starts to go up he gets a shot of Lupron. Otherwise no complaints.    Review of Systems  Constitutional:  Positive for fatigue.  HENT: Negative.    Eyes: Negative.   Respiratory: Negative.    Cardiovascular: Negative.   Gastrointestinal: Negative.   Genitourinary: Negative.   Musculoskeletal: Negative.   Skin: Negative.   Neurological: Negative.   Psychiatric/Behavioral: Negative.        Objective:   Physical Exam Constitutional:      General: He is not in acute distress.    Appearance: Normal appearance. He is well-developed. He is not diaphoretic.  HENT:     Head: Normocephalic and atraumatic.     Right Ear: External ear normal.     Left Ear: External ear normal.     Nose: Nose normal.     Mouth/Throat:     Pharynx: No oropharyngeal exudate.  Eyes:     General: No scleral icterus.       Right eye: No discharge.        Left eye: No discharge.     Conjunctiva/sclera: Conjunctivae normal.     Pupils: Pupils are equal, round, and reactive to light.  Neck:     Thyroid: No thyromegaly.     Vascular: No JVD.     Trachea: No tracheal deviation.  Cardiovascular:     Rate and Rhythm: Normal rate and regular rhythm.     Heart sounds: Normal heart sounds. No murmur heard.   No friction rub. No gallop.  Pulmonary:     Effort: Pulmonary effort is normal. No respiratory distress.     Breath sounds: Normal breath sounds. No wheezing or rales.  Chest:     Chest wall: No tenderness.  Abdominal:     General: Bowel sounds are normal. There is no distension.     Palpations: Abdomen is soft. There is no mass.     Tenderness: There is no abdominal tenderness. There is no guarding or rebound.   Genitourinary:    Penis: No tenderness.   Musculoskeletal:        General: No tenderness. Normal range of motion.     Cervical back: Neck supple.  Lymphadenopathy:     Cervical: No cervical adenopathy.  Skin:    General: Skin is warm and dry.     Coloration: Skin is not pale.     Findings: No erythema or rash.  Neurological:     Mental Status: He is alert and oriented to person, place, and time.     Cranial Nerves: No cranial nerve deficit.     Motor: No abnormal muscle tone.     Coordination: Coordination normal.     Deep Tendon Reflexes: Reflexes are normal and symmetric. Reflexes normal.  Psychiatric:        Behavior: Behavior normal.        Thought Content: Thought content normal.        Judgment: Judgment normal.          Assessment & Plan:  Well exam. We discussed diet and exercise. Get fasting labs. He is past due for a colonoscopy so he will contact the GI  office.  Alysia Penna, MD

## 2021-04-25 NOTE — Progress Notes (Signed)
Subjective:   CHARLY HOLCOMB is a 75 y.o. male who presents for an Initial Medicare Annual Wellness Visit.  Review of Systems    N/a       Objective:    There were no vitals filed for this visit. There is no height or weight on file to calculate BMI.  Advanced Directives 07/29/2017 07/29/2017 07/22/2017 12/22/2016 12/02/2016 07/10/2015 01/20/2015  Does Patient Have a Medical Advance Directive? No No No No Yes Yes No  Type of Advance Directive - - - - - Press photographer -  Would patient like information on creating a medical advance directive? No - Patient declined No - Patient declined No - Patient declined - - - -    Current Medications (verified) Outpatient Encounter Medications as of 04/25/2021  Medication Sig   famotidine (PEPCID) 20 MG tablet Take 10 mg by mouth as needed.   gabapentin (NEURONTIN) 100 MG capsule Take 1 capsule (100 mg total) by mouth 3 (three) times daily.   loteprednol (LOTEMAX) 0.5 % ophthalmic suspension 1 drop 3 (three) times daily.   sildenafil (VIAGRA) 100 MG tablet Take by mouth.   valACYclovir (VALTREX) 1000 MG tablet Take 1,000 mg by mouth 3 (three) times daily. (Patient not taking: Reported on 03/27/2021)   valACYclovir (VALTREX) 1000 MG tablet Take by mouth.   warfarin (COUMADIN) 5 MG tablet TAKE 1 TABLET DAILY EXCEPT TAKE 1/2 ON SUN AND WED OR TAKE AS DIRECTED BY COUMADIN CLINIC   No facility-administered encounter medications on file as of 04/25/2021.    Allergies (verified) Patient has no known allergies.   History: Past Medical History:  Diagnosis Date   Asthma    patient denies    Cancer [facility])    Clotting disorder ([place])    GERD (gastroesophageal reflux disease)    Glaucoma    sees Dr. Fredda Hammed   History of radiation therapy 12/14/13- 01/30/14   prostate bed 6600 cGy in 33 sessions   Pneumonia 2012   Prostate cancer ([place]) 2011   Gleason 6, 5/12 cores, sees Dr. Ardath Sax at [facility]    Pulmonary embolism [facility]) 2001,  2011   [facility] spotted fever 2006   "they think i had it "    Skin cancer    melanoma in situ, left arm, s/p local wide excision   Past Surgical History:  Procedure Laterality Date   COLONOSCOPY  07-10-15   per Dr. Henrene Pastor, benign polyp, repeat in 5 yrs    INGUINAL HERNIA REPAIR Right 2012   INGUINAL HERNIA REPAIR Left 07/29/2017   Procedure: OPEN LEFT INGUINAL [place], WITH MESH;  Surgeon: Alphonsa Overall, MD;  Location: [facility];  Service: General;  Laterality: Left;   KNEE ARTHROSCOPY Left 2010   MELANOMA EXCISION Left 2000   PROSTATE BIOPSY  01/28/2009   gleason 3+3=6   PROSTATECTOMY  11/08/09   gleason 6   VENTRAL HERNIA REPAIR N/A 07/29/2017   Procedure: LAPAROSCOPIC VENTRAL HERNIA REPAIR WITH MESH;  Surgeon: Alphonsa Overall, MD;  Location: [facility];  Service: General;  Laterality: N/A;   Family History  Problem Relation Age of Onset   Colon cancer Paternal Grandmother 41   Prostate cancer Paternal Grandfather 89   Esophageal cancer Neg Hx    Rectal cancer Neg Hx    Stomach cancer Neg Hx    Social History   Socioeconomic History   Marital status: Married    Spouse name: Not on file   Number  of children: Not on file   Years of education: Not on file   Highest education level: Not on file  Occupational History   Not on file  Tobacco Use   Smoking status: Former    Pack years: 0.00    Types: Cigars    Quit date: 11/27/1968    Years since quitting: 52.4   Smokeless tobacco: Never   Tobacco comments:    every 3  weeks socially   Substance and Sexual Activity   Alcohol use: Yes    Alcohol/week: 7.0 standard drinks    Types: 7 Shots of liquor per week    Comment: scotch usually every day   Drug use: No   Sexual activity: Not on file  Other Topics Concern   Not on file  Social History Narrative   Not on file   Social Determinants of Health   Financial Resource Strain: Not on file  Food Insecurity: Not on file  Transportation Needs: Not on file   Physical Activity: Not on file  Stress: Not on file  Social Connections: Not on file    Tobacco Counseling Counseling given: Not Answered Tobacco comments: every 3  weeks socially    Clinical Intake:                 Diabetic?no         Activities of Daily Living No flowsheet data found.  Patient Care Team: Laurey Morale, MD as PCP - General (Family Medicine) Viona Gilmore, [facility] as Pharmacist (Pharmacist)  Indicate any recent Medical Services you may have received from other than [facility] providers in the past year (date may be approximate).     Assessment:   This is a routine wellness examination for Yang.  Hearing/Vision screen No results found.  Dietary issues and exercise activities discussed:     Goals Addressed   None    Depression Screen PHQ 2/9 Scores 01/04/2020 12/02/2016 12/02/2016 10/25/2014  PHQ - 2 Score 0 0 0 0    Fall Risk Fall Risk  09/12/2019 12/02/2016 12/02/2016 06/16/2016 10/25/2014  Falls in the past year? 0 No No No No  Comment Emmi Telephone Survey: data to providers prior to load - - Emmi Telephone Survey: data to providers prior to load -    FALL RISK PREVENTION PERTAINING TO THE HOME:  Any stairs in or around the home? Yes  If so, are there any without handrails? Yes  Home free of loose throw rugs in walkways, pet beds, electrical cords, etc? Yes  Adequate lighting in your home to reduce risk of falls? Yes   ASSISTIVE DEVICES UTILIZED TO PREVENT FALLS:  Life alert? No  Use of a cane, walker or w/c? No  Grab bars in the bathroom? No  Shower chair or bench in shower? No  Elevated toilet seat or a handicapped toilet? Yes    Cognitive Function: Normal cognitive status assessed by direct observation by this Nurse Health Advisor. No abnormalities found.   MMSE - Mini Mental State Exam 12/02/2016  Not completed: (No Data)        Immunizations Immunization History  Administered Date(s) Administered   Fluad Quad(high  Dose 65+) 07/10/2019, 10/07/2020   Influenza Split 07/28/2011   Influenza Whole 07/21/2010   Influenza, High Dose Seasonal PF 08/15/2013, 07/30/2017, 08/24/2018   Influenza-Unspecified 09/28/2014, 09/20/2015, 08/19/2016, 08/28/2016   PFIZER(Purple Top)SARS-COV-2 Vaccination 11/03/2019, 11/22/2019, 07/25/2020   Pneumococcal Conjugate-13 10/25/2014   Pneumococcal Polysaccharide-23 07/28/2011   Tdap 08/20/2011  TDAP status: Due, Education has been provided regarding the importance of this vaccine. Advised may receive this vaccine at local pharmacy or Health Dept. Aware to provide a copy of the vaccination record if obtained from local pharmacy or Health Dept. Verbalized acceptance and understanding.  Flu Vaccine status: Up to date  Pneumococcal vaccine status: Up to date  Covid-19 vaccine status: Completed vaccines  Qualifies for Shingles Vaccine? Yes   Zostavax completed No   Shingrix Completed?: No.    Education has been provided regarding the importance of this vaccine. Patient has been advised to call insurance company to determine out of pocket expense if they have not yet received this vaccine. Advised may also receive vaccine at local pharmacy or Health Dept. Verbalized acceptance and understanding.  Screening Tests Health Maintenance  Topic Date Due   Hepatitis C Screening  Never done   Zoster Vaccines- Shingrix (1 of 2) Never done   COLONOSCOPY (Pts 45-56yrs Insurance coverage will need to be confirmed)  07/09/2020   COVID-19 Vaccine (4 - Booster for Pfizer series) 10/25/2020   INFLUENZA VACCINE  05/19/2021   TETANUS/TDAP  08/19/2021   PNA vac Low Risk Adult  Completed   HPV VACCINES  Aged Out    Health Maintenance  Health Maintenance Due  Topic Date Due   Hepatitis C Screening  Never done   Zoster Vaccines- Shingrix (1 of 2) Never done   COLONOSCOPY (Pts 45-11yrs Insurance coverage will need to be confirmed)  07/09/2020   COVID-19 Vaccine (4 - Booster for [place]  series) 10/25/2020    Colorectal cancer screening: No longer required.   Lung Cancer Screening: (Low Dose CT Chest recommended if Age 39-80 years, 30 pack-year currently smoking OR have quit w/in 15years.) does not qualify.   Lung Cancer Screening Referral: n/a  Additional Screening:  Hepatitis C Screening: does qualify;  Vision Screening: Recommended annual ophthalmology exams for early detection of glaucoma and other disorders of the eye. Is the patient up to date with their annual eye exam?  Yes  Who is the provider or what is the name of the office in which the patient attends annual eye exams? Dr.Bowman If pt is not established with a provider, would they like to be referred to a provider to establish care? No .   Dental Screening: Recommended annual dental exams for proper oral hygiene  Community Resource Referral / Chronic Care Management: CRR required this visit?  No   CCM required this visit?  No      Plan:     I have personally reviewed and noted the following in the patient's chart:   Medical and social history Use of alcohol, tobacco or illicit drugs  Current medications and supplements including opioid prescriptions. Patient is not currently taking opioid prescriptions. Functional ability and status Nutritional status Physical activity Advanced directives List of other physicians Hospitalizations, surgeries, and ER visits in previous 12 months Vitals Screenings to include cognitive, depression, and falls Referrals and appointments  In addition, I have reviewed and discussed with patient certain preventive protocols, quality metrics, and best practice recommendations. A written personalized care plan for preventive services as well as general preventive health recommendations were provided to patient.     Randel Pigg, LPN   10/19/6577   Nurse Notes: none

## 2021-04-28 DIAGNOSIS — Z85820 Personal history of malignant melanoma of skin: Secondary | ICD-10-CM | POA: Diagnosis not present

## 2021-04-28 DIAGNOSIS — L821 Other seborrheic keratosis: Secondary | ICD-10-CM | POA: Diagnosis not present

## 2021-04-28 DIAGNOSIS — D225 Melanocytic nevi of trunk: Secondary | ICD-10-CM | POA: Diagnosis not present

## 2021-04-28 DIAGNOSIS — Z85828 Personal history of other malignant neoplasm of skin: Secondary | ICD-10-CM | POA: Diagnosis not present

## 2021-05-19 ENCOUNTER — Other Ambulatory Visit: Payer: Self-pay

## 2021-05-19 ENCOUNTER — Ambulatory Visit (INDEPENDENT_AMBULATORY_CARE_PROVIDER_SITE_OTHER): Payer: PPO | Admitting: General Practice

## 2021-05-19 DIAGNOSIS — Z7901 Long term (current) use of anticoagulants: Secondary | ICD-10-CM | POA: Diagnosis not present

## 2021-05-19 LAB — POCT INR: INR: 2.9 (ref 2.0–3.0)

## 2021-05-19 NOTE — Patient Instructions (Signed)
Pre visit review using our clinic review tool, if applicable. No additional management support is needed unless otherwise documented below in the visit note.  Continue to take 1 pill ('5mg'$ ) daily EXCEPT for 1/2 pill on Sundays and Wednesdays.  Recheck in 6 to 8 weeks.

## 2021-06-04 ENCOUNTER — Telehealth: Payer: Self-pay | Admitting: Pharmacist

## 2021-06-04 NOTE — Chronic Care Management (AMB) (Addendum)
    Chronic Care Management Pharmacy Assistant   Name: Donald Villegas  MRN: [id number] DOB: 1945-12-14   Reason for Encounter: General Adherence Call   Recent office visits:  04-25-2021 Laurey Morale, MD - Patient presented for Preventative health care. Prescribed  Leuprolide Acetate, 6 Month,45 MG injection  04-25-2021 Randel Pigg, LPN - Patient presented for Medicare annual wellness exam. No medication changes   Recent consult visits:  05-19-2021 Warden Fillers, RN - Patient presented for Anti coag visit (2.9). Warfarin adjusted.  04-10-2021 Wallene Huh, DPM - Patient presented for Capsulitis and other concerns. Prescribed Gabapentin 100 mg three times a day.  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   Hospital visits:  None in previous 6 months  Medications: Outpatient Encounter Medications as of 06/04/2021  Medication Sig   famotidine (PEPCID) 20 MG tablet Take 10 mg by mouth as needed.   gabapentin (NEURONTIN) 100 MG capsule Take 1 capsule (100 mg total) by mouth 3 (three) times daily.   Leuprolide Acetate, 6 Month, (LUPRON DEPOT, 67-MONTH,) 45 MG injection Inject 45 mg into the muscle every 6 (six) months.   loteprednol (LOTEMAX) 0.5 % ophthalmic suspension 1 drop 3 (three) times daily.   sildenafil (VIAGRA) 100 MG tablet Take by mouth.   valACYclovir (VALTREX) 1000 MG tablet Take 1,000 mg by mouth 3 (three) times daily.   valACYclovir (VALTREX) 1000 MG tablet Take by mouth.   warfarin (COUMADIN) 5 MG tablet TAKE 1 TABLET DAILY EXCEPT TAKE 1/2 ON SUN AND WED OR TAKE AS DIRECTED BY COUMADIN CLINIC   No facility-administered encounter medications on file as of 06/04/2021.  Notes:  Call to patient spoke to his wife she adised he was at work and would have him call back Call to pt left vm on cell 2nd attempt Patient returned my call, he reports he is doing well, he noted that he is  taking all of his medications as prescribed with the change of the gabapentin to now 2 times daily and not 3 times.He reports no side effects or issues with any medications.  He reports that his medications are on auto fill with the pharmacy and he is not in need of any refills. 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 Scheduled for 03-2022 per [place] Gaps: AWV - Done 04-25-21 CCM F/U Call - 09-03-21 Hepatitis C Screening - Overdue Zoster Vaccine - Overdue Colonoscopy - Overdue COVID Booster #4 (Overdue) Flu Vaccine - Overdue  Star Rating Drugs: None   Ned Clines [place] Clinical Pharmacist Assistant [phone]

## 2021-06-24 DIAGNOSIS — H04122 Dry eye syndrome of left lacrimal gland: Secondary | ICD-10-CM | POA: Diagnosis not present

## 2021-07-02 DIAGNOSIS — H40023 Open angle with borderline findings, high risk, bilateral: Secondary | ICD-10-CM | POA: Diagnosis not present

## 2021-07-02 DIAGNOSIS — H04123 Dry eye syndrome of bilateral lacrimal glands: Secondary | ICD-10-CM | POA: Diagnosis not present

## 2021-07-02 DIAGNOSIS — C775 Secondary and unspecified malignant neoplasm of intrapelvic lymph nodes: Secondary | ICD-10-CM | POA: Diagnosis not present

## 2021-07-02 DIAGNOSIS — L718 Other rosacea: Secondary | ICD-10-CM | POA: Diagnosis not present

## 2021-07-02 DIAGNOSIS — C61 Malignant neoplasm of prostate: Secondary | ICD-10-CM | POA: Diagnosis not present

## 2021-07-09 DIAGNOSIS — C775 Secondary and unspecified malignant neoplasm of intrapelvic lymph nodes: Secondary | ICD-10-CM | POA: Diagnosis not present

## 2021-07-09 DIAGNOSIS — C61 Malignant neoplasm of prostate: Secondary | ICD-10-CM | POA: Diagnosis not present

## 2021-07-10 ENCOUNTER — Telehealth: Payer: Self-pay

## 2021-07-10 NOTE — Telephone Encounter (Addendum)
Coumadin nurse will not be in clinic on 9/26 and pt has apt scheduled for 9/26. Tried to contact pt to RS but had to LVM.   If pt comes to apt because they have not received msg lab may be able to do a POCT INR if asked. If that is possible then a msg can be sent with the result to the coumadin nurse to f/u on Tues.

## 2021-07-14 ENCOUNTER — Ambulatory Visit: Payer: PPO

## 2021-07-21 ENCOUNTER — Other Ambulatory Visit: Payer: Self-pay

## 2021-07-21 ENCOUNTER — Ambulatory Visit (INDEPENDENT_AMBULATORY_CARE_PROVIDER_SITE_OTHER): Payer: PPO

## 2021-07-21 DIAGNOSIS — Z7901 Long term (current) use of anticoagulants: Secondary | ICD-10-CM

## 2021-07-21 LAB — POCT INR: INR: 3 (ref 2.0–3.0)

## 2021-07-21 NOTE — Patient Instructions (Addendum)
Pre visit review using our clinic review tool, if applicable. No additional management support is needed unless otherwise documented below in the visit note.  Continue to take 1 pill (5mg ) daily EXCEPT for 1/2 pill on Sundays and Wednesdays.  Recheck in 6

## 2021-08-12 DIAGNOSIS — H04123 Dry eye syndrome of bilateral lacrimal glands: Secondary | ICD-10-CM | POA: Diagnosis not present

## 2021-08-12 DIAGNOSIS — H40023 Open angle with borderline findings, high risk, bilateral: Secondary | ICD-10-CM | POA: Diagnosis not present

## 2021-08-12 DIAGNOSIS — L718 Other rosacea: Secondary | ICD-10-CM | POA: Diagnosis not present

## 2021-09-01 ENCOUNTER — Ambulatory Visit (INDEPENDENT_AMBULATORY_CARE_PROVIDER_SITE_OTHER): Payer: PPO

## 2021-09-01 DIAGNOSIS — Z7901 Long term (current) use of anticoagulants: Secondary | ICD-10-CM | POA: Diagnosis not present

## 2021-09-01 LAB — POCT INR: INR: 2.7 (ref 2.0–3.0)

## 2021-09-01 NOTE — Progress Notes (Signed)
Continue to take 1 pill (5mg ) daily EXCEPT for 1/2 pill on Sundays and Wednesdays.  Recheck in 6 to 8 weeks.

## 2021-09-01 NOTE — Patient Instructions (Addendum)
Pre visit review using our clinic review tool, if applicable. No additional management support is needed unless otherwise documented below in the visit note.  Continue to take 1 pill (5mg ) daily EXCEPT for 1/2 pill on Sundays and Wednesdays.  Recheck in 6 to 8 weeks.

## 2021-09-03 ENCOUNTER — Telehealth: Payer: PPO

## 2021-09-15 ENCOUNTER — Other Ambulatory Visit: Payer: Self-pay | Admitting: Family Medicine

## 2021-09-15 DIAGNOSIS — Z7901 Long term (current) use of anticoagulants: Secondary | ICD-10-CM

## 2021-09-16 NOTE — Telephone Encounter (Signed)
Pt is compliant with warfarin management and seeing PCP. Sent in refill.

## 2021-09-23 ENCOUNTER — Telehealth: Payer: Self-pay | Admitting: Pharmacist

## 2021-09-23 NOTE — Progress Notes (Signed)
A user error has taken place: encounter opened in error, closed for administrative reasons.

## 2021-10-22 DIAGNOSIS — C61 Malignant neoplasm of prostate: Secondary | ICD-10-CM | POA: Diagnosis not present

## 2021-10-22 DIAGNOSIS — C775 Secondary and unspecified malignant neoplasm of intrapelvic lymph nodes: Secondary | ICD-10-CM | POA: Diagnosis not present

## 2021-10-22 DIAGNOSIS — Z79818 Long term (current) use of other agents affecting estrogen receptors and estrogen levels: Secondary | ICD-10-CM | POA: Diagnosis not present

## 2021-10-27 ENCOUNTER — Ambulatory Visit (INDEPENDENT_AMBULATORY_CARE_PROVIDER_SITE_OTHER): Payer: PPO

## 2021-10-27 DIAGNOSIS — Z7901 Long term (current) use of anticoagulants: Secondary | ICD-10-CM | POA: Diagnosis not present

## 2021-10-27 LAB — POCT INR: INR: 2.2 (ref 2.0–3.0)

## 2021-10-27 NOTE — Patient Instructions (Addendum)
Pre visit review using our clinic review tool, if applicable. No additional management support is needed unless otherwise documented below in the visit note.  Continue to take 1 pill (5mg ) daily EXCEPT for 1/2 pill on Sundays and Wednesdays.  Recheck in 6 to 8 weeks.

## 2021-10-27 NOTE — Progress Notes (Signed)
Continue to take 1 pill (5mg ) daily EXCEPT for 1/2 pill on Sundays and Wednesdays.  Recheck in 6 to 8 weeks.

## 2021-10-29 DIAGNOSIS — L820 Inflamed seborrheic keratosis: Secondary | ICD-10-CM | POA: Diagnosis not present

## 2021-10-29 DIAGNOSIS — L570 Actinic keratosis: Secondary | ICD-10-CM | POA: Diagnosis not present

## 2021-10-29 DIAGNOSIS — Z85828 Personal history of other malignant neoplasm of skin: Secondary | ICD-10-CM | POA: Diagnosis not present

## 2021-10-29 DIAGNOSIS — Z85820 Personal history of malignant melanoma of skin: Secondary | ICD-10-CM | POA: Diagnosis not present

## 2021-10-29 DIAGNOSIS — L817 Pigmented purpuric dermatosis: Secondary | ICD-10-CM | POA: Diagnosis not present

## 2021-10-29 DIAGNOSIS — L821 Other seborrheic keratosis: Secondary | ICD-10-CM | POA: Diagnosis not present

## 2021-11-02 ENCOUNTER — Other Ambulatory Visit: Payer: Self-pay | Admitting: Podiatry

## 2021-11-04 DIAGNOSIS — L718 Other rosacea: Secondary | ICD-10-CM | POA: Diagnosis not present

## 2021-11-04 DIAGNOSIS — H04123 Dry eye syndrome of bilateral lacrimal glands: Secondary | ICD-10-CM | POA: Diagnosis not present

## 2021-11-04 DIAGNOSIS — H40023 Open angle with borderline findings, high risk, bilateral: Secondary | ICD-10-CM | POA: Diagnosis not present

## 2021-12-08 ENCOUNTER — Ambulatory Visit: Payer: PPO | Admitting: Internal Medicine

## 2021-12-22 ENCOUNTER — Ambulatory Visit (INDEPENDENT_AMBULATORY_CARE_PROVIDER_SITE_OTHER): Payer: PPO

## 2021-12-22 DIAGNOSIS — Z7901 Long term (current) use of anticoagulants: Secondary | ICD-10-CM

## 2021-12-22 LAB — POCT INR: INR: 3.3 — AB (ref 2.0–3.0)

## 2021-12-22 NOTE — Patient Instructions (Addendum)
Pre visit review using our clinic review tool, if applicable. No additional management support is needed unless otherwise documented below in the visit note. ? ?Reduce dose tomorrow to take 1/2 (2.5 mg) tablets continue to take 1 tablet ('5mg'$ ) daily EXCEPT for 1/2 (2.5 mg) tablet on Sundays and Wednesdays.  Recheck in 4 weeks. ?

## 2021-12-22 NOTE — Progress Notes (Signed)
Pt denies any changes that would cause elevated INR. He has had some stress with wife's health. Pt already took warfarin today.  ?Reduce dose tomorrow to take 1/2 (2.5 mg) tablet continue to take 1 tablet ('5mg'$ ) daily EXCEPT for 1/2 (2.5 mg) tablet on Sundays and Wednesdays.  Recheck in 4 weeks. ?

## 2022-01-19 ENCOUNTER — Ambulatory Visit: Payer: PPO

## 2022-01-21 ENCOUNTER — Ambulatory Visit (INDEPENDENT_AMBULATORY_CARE_PROVIDER_SITE_OTHER): Payer: PPO

## 2022-01-21 DIAGNOSIS — Z7901 Long term (current) use of anticoagulants: Secondary | ICD-10-CM | POA: Diagnosis not present

## 2022-01-21 LAB — POCT INR: INR: 2 (ref 2.0–3.0)

## 2022-01-21 NOTE — Progress Notes (Signed)
Continue to take 1 tablet ('5mg'$ ) daily EXCEPT for 1/2 (2.5 mg) tablet on Sundays and Wednesdays.  Recheck in 6 weeks. ?

## 2022-01-21 NOTE — Patient Instructions (Addendum)
Pre visit review using our clinic review tool, if applicable. No additional management support is needed unless otherwise documented below in the visit note. ? ?Continue to take 1 tablet ('5mg'$ ) daily EXCEPT for 1/2 (2.5 mg) tablet on Sundays and Wednesdays.  Recheck in 6 weeks. ?

## 2022-02-25 DIAGNOSIS — C61 Malignant neoplasm of prostate: Secondary | ICD-10-CM | POA: Diagnosis not present

## 2022-02-25 DIAGNOSIS — C775 Secondary and unspecified malignant neoplasm of intrapelvic lymph nodes: Secondary | ICD-10-CM | POA: Diagnosis not present

## 2022-03-02 ENCOUNTER — Ambulatory Visit (INDEPENDENT_AMBULATORY_CARE_PROVIDER_SITE_OTHER): Payer: PPO

## 2022-03-02 DIAGNOSIS — Z7901 Long term (current) use of anticoagulants: Secondary | ICD-10-CM | POA: Diagnosis not present

## 2022-03-02 LAB — POCT INR: INR: 1.9 — AB (ref 2.0–3.0)

## 2022-03-02 NOTE — Progress Notes (Signed)
Increase dose today to take 1 1/2 tabletss (7.5 mg) and then continue to take 1 tablet ('5mg'$ ) daily EXCEPT for 1/2 (2.5 mg) tablet on Sundays and Wednesdays.  Recheck in 4 weeks. ?

## 2022-03-02 NOTE — Patient Instructions (Addendum)
Pre visit review using our clinic review tool, if applicable. No additional management support is needed unless otherwise documented below in the visit note. ? ?Increase dose today to take 1 1/2 tabletss (7.5 mg) and then continue to take 1 tablet ('5mg'$ ) daily EXCEPT for 1/2 (2.5 mg) tablet on Sundays and Wednesdays.  Recheck in 4 weeks. ?

## 2022-03-04 DIAGNOSIS — H04123 Dry eye syndrome of bilateral lacrimal glands: Secondary | ICD-10-CM | POA: Diagnosis not present

## 2022-03-04 DIAGNOSIS — H40023 Open angle with borderline findings, high risk, bilateral: Secondary | ICD-10-CM | POA: Diagnosis not present

## 2022-03-04 DIAGNOSIS — L718 Other rosacea: Secondary | ICD-10-CM | POA: Diagnosis not present

## 2022-03-20 ENCOUNTER — Other Ambulatory Visit: Payer: Self-pay | Admitting: Family Medicine

## 2022-03-20 DIAGNOSIS — Z7901 Long term (current) use of anticoagulants: Secondary | ICD-10-CM

## 2022-03-20 NOTE — Telephone Encounter (Signed)
Pt is compliant with warfarin management and PCP apts. ?Sent in refill.  ?

## 2022-03-30 ENCOUNTER — Ambulatory Visit: Payer: PPO

## 2022-03-31 ENCOUNTER — Ambulatory Visit (INDEPENDENT_AMBULATORY_CARE_PROVIDER_SITE_OTHER): Payer: PPO | Admitting: *Deleted

## 2022-03-31 ENCOUNTER — Ambulatory Visit (INDEPENDENT_AMBULATORY_CARE_PROVIDER_SITE_OTHER): Payer: PPO

## 2022-03-31 ENCOUNTER — Other Ambulatory Visit: Payer: PPO

## 2022-03-31 DIAGNOSIS — Z7901 Long term (current) use of anticoagulants: Secondary | ICD-10-CM

## 2022-03-31 LAB — POCT INR: INR: 2.5 (ref 2.0–3.0)

## 2022-03-31 NOTE — Patient Instructions (Addendum)
Pre visit review using our clinic review tool, if applicable. No additional management support is needed unless otherwise documented below in the visit note.  Continue to take 1 tablet ('5mg'$ ) daily EXCEPT for 1/2 (2.5 mg) tablet on Sundays and Wednesdays.  Recheck in 5 weeks.

## 2022-03-31 NOTE — Progress Notes (Signed)
Continue to take 1 tablet ('5mg'$ ) daily EXCEPT for 1/2 (2.5 mg) tablet on Sundays and Wednesdays.  Recheck in 5 weeks. Contacted pt by phone and advised of dosing and recheck INR in 5 weeks. Pt verbalized understanding.

## 2022-04-06 ENCOUNTER — Telehealth: Payer: PPO

## 2022-04-08 ENCOUNTER — Ambulatory Visit: Payer: PPO

## 2022-04-27 ENCOUNTER — Telehealth: Payer: Self-pay | Admitting: Family Medicine

## 2022-04-27 NOTE — Telephone Encounter (Signed)
Left message for patient to call back and schedule Medicare Annual Wellness Visit (AWV) either virtually or in office. Left  my Donald Villegas number [phone]   Last AWV ;04/25/21  please schedule at anytime with [facility] Nurse Health Advisor 1 or 2

## 2022-04-28 ENCOUNTER — Ambulatory Visit (INDEPENDENT_AMBULATORY_CARE_PROVIDER_SITE_OTHER): Payer: PPO

## 2022-04-28 VITALS — Ht 71.0 in | Wt 175.0 lb

## 2022-04-28 DIAGNOSIS — D225 Melanocytic nevi of trunk: Secondary | ICD-10-CM | POA: Diagnosis not present

## 2022-04-28 DIAGNOSIS — Z85828 Personal history of other malignant neoplasm of skin: Secondary | ICD-10-CM | POA: Diagnosis not present

## 2022-04-28 DIAGNOSIS — L820 Inflamed seborrheic keratosis: Secondary | ICD-10-CM | POA: Diagnosis not present

## 2022-04-28 DIAGNOSIS — D485 Neoplasm of uncertain behavior of skin: Secondary | ICD-10-CM | POA: Diagnosis not present

## 2022-04-28 DIAGNOSIS — L570 Actinic keratosis: Secondary | ICD-10-CM | POA: Diagnosis not present

## 2022-04-28 DIAGNOSIS — Z0000 Encounter for general adult medical examination without abnormal findings: Secondary | ICD-10-CM

## 2022-04-28 DIAGNOSIS — D2261 Melanocytic nevi of right upper limb, including shoulder: Secondary | ICD-10-CM | POA: Diagnosis not present

## 2022-04-28 DIAGNOSIS — L821 Other seborrheic keratosis: Secondary | ICD-10-CM | POA: Diagnosis not present

## 2022-04-28 DIAGNOSIS — Z85820 Personal history of malignant melanoma of skin: Secondary | ICD-10-CM | POA: Diagnosis not present

## 2022-04-28 DIAGNOSIS — C44319 Basal cell carcinoma of skin of other parts of face: Secondary | ICD-10-CM | POA: Diagnosis not present

## 2022-04-28 NOTE — Patient Instructions (Addendum)
Donald Villegas , Thank you for taking time to come for your Medicare Wellness Visit. I appreciate your ongoing commitment to your health goals. Please review the following plan we discussed and let me know if I can assist you in the future.   These are the goals we discussed:  Goals       Exercise 150 minutes per week (moderate activity)      [facility]  The benefits: guarantee staying consistently active The result if you don't is staying sedentary   Need to develop plan for winter that may be helpful   Biking may be an option/ [place]; off road       No current goals (pt-stated)        This is a list of the screening recommended for you and due dates:  Health Maintenance  Topic Date Due   COVID-19 Vaccine (6 - Booster for Pfizer series) 05/14/2022*   Zoster (Shingles) Vaccine (1 of 2) 07/29/2022*   Colon Cancer Screening  04/29/2023*   Tetanus Vaccine  04/29/2023*   Hepatitis C Screening: USPSTF Recommendation to screen - Ages 18-79 yo.  04/29/2023*   Flu Shot  05/19/2022   Pneumonia Vaccine  Completed   HPV Vaccine  Aged Out  *Topic was postponed. The date shown is not the original due date.    Advanced directives: Yes  Conditions/risks identified: None  Next appointment: Follow up in one year for your annual wellness visit.    Preventive Care 41 Years and Older, Male Preventive care refers to lifestyle choices and visits with your health care provider that can promote health and wellness. What does preventive care include? A yearly physical exam. This is also called an annual well check. Dental exams once or twice a year. Routine eye exams. Ask your health care provider how often you should have your eyes checked. Personal lifestyle choices, including: Daily care of your teeth and gums. Regular physical activity. Eating a healthy diet. Avoiding tobacco and drug use. Limiting alcohol use. Practicing safe sex. Taking low doses  of aspirin every day. Taking vitamin and mineral supplements as recommended by your health care provider. What happens during an annual well check? The services and screenings done by your health care provider during your annual well check will depend on your age, overall health, lifestyle risk factors, and family history of disease. Counseling  Your health care provider may ask you questions about your: Alcohol use. Tobacco use. Drug use. Emotional well-being. Home and relationship well-being. Sexual activity. Eating habits. History of falls. Memory and ability to understand (cognition). Work and work Statistician. Screening  You may have the following tests or measurements: Height, weight, and BMI. Blood pressure. Lipid and cholesterol levels. These may be checked every 5 years, or more frequently if you are over 15 years old. Skin check. Lung cancer screening. You may have this screening every year starting at age 32 if you have a 30-pack-year history of smoking and currently smoke or have quit within the past 15 years. Fecal occult blood test (FOBT) of the stool. You may have this test every year starting at age 75. Flexible sigmoidoscopy or colonoscopy. You may have a sigmoidoscopy every 5 years or a colonoscopy every 10 years starting at age 81. Prostate cancer screening. Recommendations will vary depending on your family history and other risks. Hepatitis C blood test. Hepatitis B blood test. Sexually transmitted disease (STD) testing. Diabetes screening. This is done by  checking your blood sugar (glucose) after you have not eaten for a while (fasting). You may have this done every 1-3 years. Abdominal aortic aneurysm (AAA) screening. You may need this if you are a current or former smoker. Osteoporosis. You may be screened starting at age 4 if you are at high risk. Talk with your health care provider about your test results, treatment options, and if necessary, the need for  more tests. Vaccines  Your health care provider may recommend certain vaccines, such as: Influenza vaccine. This is recommended every year. Tetanus, diphtheria, and acellular pertussis (Tdap, Td) vaccine. You may need a Td booster every 10 years. Zoster vaccine. You may need this after age 28. Pneumococcal 13-valent conjugate (PCV13) vaccine. One dose is recommended after age 75. Pneumococcal polysaccharide (PPSV23) vaccine. One dose is recommended after age 35. Talk to your health care provider about which screenings and vaccines you need and how often you need them. This information is not intended to replace advice given to you by your health care provider. Make sure you discuss any questions you have with your health care provider. Document Released: 11/01/2015 Document Revised: 06/24/2016 Document Reviewed: 08/06/2015 Elsevier Interactive Patient Education  2017 [place] Prevention in the Home Falls can cause injuries. They can happen to people of all ages. There are many things you can do to make your home safe and to help prevent falls. What can I do on the outside of my home? Regularly fix the edges of walkways and driveways and fix any cracks. Remove anything that might make you trip as you walk through a door, such as a raised step or threshold. Trim any bushes or trees on the path to your home. Use bright outdoor lighting. Clear any walking paths of anything that might make someone trip, such as rocks or tools. Regularly check to see if handrails are loose or broken. Make sure that both sides of any steps have handrails. Any raised decks and porches should have guardrails on the edges. Have any leaves, snow, or ice cleared regularly. Use sand or salt on walking paths during winter. Clean up any spills in your garage right away. This includes oil or grease spills. What can I do in the bathroom? Use night lights. Install grab bars by the toilet and in the tub and  shower. Do not use towel bars as grab bars. Use non-skid mats or decals in the tub or shower. If you need to sit down in the shower, use a plastic, non-slip stool. Keep the floor dry. Clean up any water that spills on the floor as soon as it happens. Remove soap buildup in the tub or shower regularly. Attach bath mats securely with double-sided non-slip rug tape. Do not have throw rugs and other things on the floor that can make you trip. What can I do in the bedroom? Use night lights. Make sure that you have a light by your bed that is easy to reach. Do not use any sheets or blankets that are too big for your bed. They should not hang down onto the floor. Have a firm chair that has side arms. You can use this for support while you get dressed. Do not have throw rugs and other things on the floor that can make you trip. What can I do in the kitchen? Clean up any spills right away. Avoid walking on wet floors. Keep items that you use a lot in easy-to-reach places. If you need to reach  something above you, use a strong step stool that has a grab bar. Keep electrical cords out of the way. Do not use floor polish or wax that makes floors slippery. If you must use wax, use non-skid floor wax. Do not have throw rugs and other things on the floor that can make you trip. What can I do with my stairs? Do not leave any items on the stairs. Make sure that there are handrails on both sides of the stairs and use them. Fix handrails that are broken or loose. Make sure that handrails are as long as the stairways. Check any carpeting to make sure that it is firmly attached to the stairs. Fix any carpet that is loose or worn. Avoid having throw rugs at the top or bottom of the stairs. If you do have throw rugs, attach them to the floor with carpet tape. Make sure that you have a light switch at the top of the stairs and the bottom of the stairs. If you do not have them, ask someone to add them for  you. What else can I do to help prevent falls? Wear shoes that: Do not have high heels. Have rubber bottoms. Are comfortable and fit you well. Are closed at the toe. Do not wear sandals. If you use a stepladder: Make sure that it is fully opened. Do not climb a closed stepladder. Make sure that both sides of the stepladder are locked into place. Ask someone to hold it for you, if possible. Clearly mark and make sure that you can see: Any grab bars or handrails. First and last steps. Where the edge of each step is. Use tools that help you move around (mobility aids) if they are needed. These include: Canes. Walkers. Scooters. Crutches. Turn on the lights when you go into a dark area. Replace any light bulbs as soon as they burn out. Set up your furniture so you have a clear path. Avoid moving your furniture around. If any of your floors are uneven, fix them. If there are any pets around you, be aware of where they are. Review your medicines with your doctor. Some medicines can make you feel dizzy. This can increase your chance of falling. Ask your doctor what other things that you can do to help prevent falls. This information is not intended to replace advice given to you by your health care provider. Make sure you discuss any questions you have with your health care provider. Document Released: 08/01/2009 Document Revised: 03/12/2016 Document Reviewed: 11/09/2014 Elsevier Interactive Patient Education  2017 [employer].

## 2022-04-28 NOTE — Progress Notes (Signed)
Subjective:   Donald Villegas is a 76 y.o. male who presents for Medicare Annual/Subsequent preventive examination.  Review of Systems    Virtual Visit via Telephone Note  I connected with  [name] on 04/28/22 at  9:45 AM EDT by telephone and verified that I am speaking with the correct person using two identifiers.  Location: Patient: Home Provider: Office Persons participating in the virtual visit: patient/Nurse Health Advisor   I discussed the limitations, risks, security and privacy concerns of performing an evaluation and management service by telephone and the availability of in person appointments. The patient expressed understanding and agreed to proceed.  Interactive audio and video telecommunications were attempted between this nurse and patient, however failed, due to patient having technical difficulties OR patient did not have access to video capability.  We continued and completed visit with audio only.  Some vital signs may be absent or patient reported.   Criselda Peaches, LPN  Cardiac Risk Factors include: advanced age (>31mn, >>43women);male gender     Objective:    Today's Vitals   04/28/22 1000  Weight: 175 lb (79.4 kg)  Height: '5\' 11"'$  (1.803 m)   Body mass index is 24.41 kg/m.     04/28/2022   10:07 AM 04/25/2021    8:33 AM 07/29/2017    1:00 PM 07/29/2017    7:49 AM 07/22/2017    8:29 AM 12/22/2016    7:20 AM 12/02/2016   10:39 AM  Advanced Directives  Does Patient Have a Medical Advance Directive? Yes Yes No No No No Yes  Type of AParamedicof [place]Living will HHens[place]Living will       Does patient want to make changes to medical advance directive? No - Patient declined        Copy of HCohassetin Chart? No - copy requested No - copy requested       Would patient like information on creating a medical advance directive?   No - Patient declined No - Patient declined No - Patient  declined      Current Medications (verified) Outpatient Encounter Medications as of 04/28/2022  Medication Sig   famotidine (PEPCID) 20 MG tablet Take 10 mg by mouth as needed.   gabapentin (NEURONTIN) 100 MG capsule TAKE 1 CAPSULE (100 MG TOTAL) BY MOUTH THREE TIMES DAILY.   Leuprolide Acetate, 6 Month, (LUPRON DEPOT, 52-MONTH,) 45 MG injection Inject 45 mg into the muscle every 6 (six) months.   loteprednol (LOTEMAX) 0.5 % ophthalmic suspension 1 drop 3 (three) times daily.   sildenafil (VIAGRA) 100 MG tablet Take by mouth.   valACYclovir (VALTREX) 1000 MG tablet Take 1,000 mg by mouth 3 (three) times daily.   valACYclovir (VALTREX) 1000 MG tablet Take by mouth.   warfarin (COUMADIN) 5 MG tablet TAKE 1 TABLET BY MOUTH DAILY EXCEPT TAKE 1/2 TABLET ON WEDNESDAYS AND SUNDAYS OR AS DIRECTED BY COUMADIN CLINIC   No facility-administered encounter medications on file as of 04/28/2022.    Allergies (verified) Patient has no known allergies.   History: Past Medical History:  Diagnosis Date   Asthma    patient denies    Cancer ([facility]    [place]    GERD (gastroesophageal reflux disease)    Glaucoma    sees Dr. SFredda Hammed  History of radiation therapy 12/14/13- 01/30/14   prostate bed 6600 cGy in 33 sessions   Pneumonia 2012  Prostate cancer 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 Ardath Sax at [facility]    Pulmonary embolism [facility]) 2001, 2011   [facility] spotted fever 2006   "they think i had it "    Skin cancer    melanoma in situ, left arm, s/p local wide excision   Past Surgical History:  Procedure Laterality Date   COLONOSCOPY  07-10-15   per Dr. Henrene Pastor, benign polyp, repeat in 5 yrs    INGUINAL HERNIA REPAIR Right 2012   INGUINAL HERNIA REPAIR Left 07/29/2017   Procedure: OPEN LEFT INGUINAL [place], WITH MESH;  Surgeon: Alphonsa Overall, MD;  Location: [facility];  Service: General;  Laterality: Left;   KNEE ARTHROSCOPY Left 2010    MELANOMA EXCISION Left 2000   PROSTATE BIOPSY  01/28/2009   gleason 3+3=6   PROSTATECTOMY  11/08/09   gleason 6   VENTRAL HERNIA REPAIR N/A 07/29/2017   Procedure: LAPAROSCOPIC VENTRAL HERNIA REPAIR WITH MESH;  Surgeon: Alphonsa Overall, MD;  Location: [facility];  Service: General;  Laterality: N/A;   Family History  Problem Relation Age of Onset   Colon cancer Paternal Grandmother 73   Prostate cancer Paternal Grandfather 89   Esophageal cancer Neg Hx    Rectal cancer Neg Hx    Stomach cancer Neg Hx    Social History   Socioeconomic History   Marital status: Married    Spouse name: Not on file   Number of children: Not on file   Years of education: Not on file   Highest education level: Not on file  Occupational History   Not on file  Tobacco Use   Smoking status: Former    Types: Cigars    Quit date: 11/27/1968    Years since quitting: 53.4   Smokeless tobacco: Never   Tobacco comments:    every 3  weeks socially   Substance and Sexual Activity   Alcohol use: Yes    Alcohol/week: 7.0 standard drinks of alcohol    Types: 7 Shots of liquor per week    Comment: scotch usually every day   Drug use: No   Sexual activity: Not on file  Other Topics Concern   Not on file  Social History Narrative   Not on file   Social Determinants of Health   Financial Resource Strain: Low Risk  (04/28/2022)   Overall Financial Resource Strain (CARDIA)    Difficulty of Paying Living Expenses: Not hard at all  Food Insecurity: No Food Insecurity (04/28/2022)   Hunger Vital Sign    Worried About Running Out of Food in the Last Year: Never true    Ran Out of Food in the Last Year: Never true  Transportation Needs: No Transportation Needs (04/28/2022)   PRAPARE - Hydrologist (Medical): No    Lack of Transportation (Non-Medical): No  Physical Activity: Sufficiently Active (04/28/2022)   Exercise Vital Sign    Days of Exercise per Week: 7 days    Minutes of  Exercise per Session: 150+ min  Stress: No Stress Concern Present (04/28/2022)   [place]    Feeling of Stress : Not at all  Social Connections: [place] (04/28/2022)   Social Connection and Isolation Panel [NHANES]    Frequency of Communication with Friends and Family: More than three times a week    Frequency of Social Gatherings with Friends and Family: More  than three times a week    Attends Religious Services: More than 4 times per year    Active Member of Clubs or Organizations: Yes    Attends Music therapist: More than 4 times per year    Marital Status: Married    Tobacco Counseling Counseling given: Not Answered Tobacco comments: every 3  weeks socially    Clinical Intake:  Pre-visit preparation completed: No  Pain : No/denies pain     Nutritional Risks: None  How often do you need to have someone help you when you read instructions, pamphlets, or other written materials from your doctor or pharmacy?: 1 - Never  Diabetic?  No  Interpreter Needed?: No  Information entered by :: Rolene Arbour LPN   Activities of Daily Living    04/28/2022   10:06 AM  In your present state of health, do you have any difficulty performing the following activities:  Hearing? 0  Vision? 0  Difficulty concentrating or making decisions? 0  Walking or climbing stairs? 0  Dressing or bathing? 0  Doing errands, shopping? 0  Preparing Food and eating ? N  Using the Toilet? N  In the past six months, have you accidently leaked urine? N  Do you have problems with loss of bowel control? N  Managing your Medications? N  Managing your Finances? N  Housekeeping or managing your Housekeeping? N    Patient Care Team: Laurey Morale, MD as PCP - General (Family Medicine) Viona Gilmore, [facility] as Pharmacist (Pharmacist)  Indicate any recent Medical Services you may have received from other than  [facility] providers in the past year (date may be approximate).     Assessment:   This is a routine wellness examination for Luc.  Hearing/Vision screen Hearing Screening - Comments:: No hearing difficulty Vision Screening - Comments:: Wears glasses. Followed by Dr Valetta Close  Dietary issues and exercise activities discussed: Exercise limited by: None identified   Goals Addressed               This Visit's Progress     No current goals (pt-stated)         Depression Screen    04/28/2022   10:04 AM 04/25/2021    8:34 AM 04/25/2021    8:31 AM 01/04/2020    9:59 AM 12/02/2016   10:40 AM 12/02/2016    9:10 AM 10/25/2014    9:40 AM  PHQ 2/9 Scores  PHQ - 2 Score 0 0 0 0 0 0 0    Fall Risk    04/28/2022   10:07 AM 04/25/2021    8:49 AM 04/25/2021    8:34 AM 09/12/2019    5:29 PM 12/02/2016   10:40 AM  Fall Risk   Falls in the past year? 0 0 0 0 No  Comment    Emmi Telephone Survey: data to providers prior to load   Number falls in past yr: 0 0 0    Injury with Fall? 0 0 0    Risk for fall due to : No Fall Risks No Fall Risks     Follow up   Falls evaluation completed      FALL RISK PREVENTION PERTAINING TO THE HOME:  Any stairs in or around the home? Yes  If so, are there any without handrails? No  Home free of loose throw rugs in walkways, pet beds, electrical cords, etc? Yes  Adequate lighting in your home to reduce risk of falls? Yes  ASSISTIVE DEVICES UTILIZED TO PREVENT FALLS:  Life alert? No  Use of a cane, walker or w/c? No  Grab bars in the bathroom? No  Shower chair or bench in shower? No  Elevated toilet seat or a handicapped toilet? No   TIMED UP AND GO:  Was the test performed? No . Audio Visit  Cognitive Function:        04/28/2022   10:08 AM  6CIT Screen  What Year? 0 points  What month? 0 points  What time? 0 points  Count back from 20 0 points  Months in reverse 0 points  Repeat phrase 0 points  Total Score 0 points     Immunizations Immunization History  Administered Date(s) Administered   Fluad Quad(high Dose 65+) 07/10/2019, 10/07/2020   Influenza Split 07/28/2011   Influenza Whole 07/21/2010   Influenza, High Dose Seasonal PF 08/15/2013, 07/30/2017, 08/24/2018   Influenza-Unspecified 09/28/2014, 09/20/2015, 08/19/2016, 08/28/2016   PFIZER Comirnaty(Gray Top)Covid-19 Tri-Sucrose Vaccine 11/03/2019, 11/22/2019   PFIZER(Purple Top)SARS-COV-2 Vaccination 11/03/2019, 11/22/2019, 07/25/2020   Pneumococcal Conjugate-13 10/25/2014   Pneumococcal Polysaccharide-23 07/28/2011   Tdap 08/20/2011    TDAP status: Due, Education has been provided regarding the importance of this vaccine. Advised may receive this vaccine at local pharmacy or Health Dept. Aware to provide a copy of the vaccination record if obtained from local pharmacy or Health Dept. Verbalized acceptance and understanding.    Pneumococcal vaccine status: Up to date  Covid-19 vaccine status: Completed vaccines  Qualifies for Shingles Vaccine? Yes   Zostavax completed No   Shingrix Completed?: No.    Education has been provided regarding the importance of this vaccine. Patient has been advised to call insurance company to determine out of pocket expense if they have not yet received this vaccine. Advised may also receive vaccine at local pharmacy or Health Dept. Verbalized acceptance and understanding.  Screening Tests Health Maintenance  Topic Date Due   COVID-19 Vaccine (6 - Booster for Pfizer series) 05/14/2022 (Originally 09/19/2020)   Zoster Vaccines- Shingrix (1 of 2) 07/29/2022 (Originally 02/02/1965)   COLONOSCOPY (Pts 45-49yr Insurance coverage will need to be confirmed)  04/29/2023 (Originally 07/09/2020)   TETANUS/TDAP  04/29/2023 (Originally 08/19/2021)   Hepatitis C Screening  04/29/2023 (Originally 02/01/1964)   INFLUENZA VACCINE  05/19/2022   Pneumonia Vaccine 76 Years old  Completed   HPV VACCINES  Aged Out    Health  Maintenance  There are no preventive care reminders to display for this patient.   Colorectal cancer screening: Referral to GI placed Patient deferred. Pt aware the office will call re: appt.  Lung Cancer Screening: (Low Dose CT Chest recommended if Age 78-80years, 30 pack-year currently smoking OR have quit w/in 15years.) does not qualify.    Additional Screening:  Hepatitis C Screening: does qualify; Completed Patient deferred  Vision Screening: Recommended annual ophthalmology exams for early detection of glaucoma and other disorders of the eye. Is the patient up to date with their annual eye exam?  Yes  Who is the provider or what is the name of the office in which the patient attends annual eye exams? Dr BValetta CloseIf pt is not established with a provider, would they like to be referred to a provider to establish care? No .   Dental Screening: Recommended annual dental exams for proper oral hygiene  Community Resource Referral / Chronic Care Management:   CRR required this visit?  No   CCM required this visit?  No  Plan:     I have personally reviewed and noted the following in the patient's chart:   Medical and social history Use of alcohol, tobacco or illicit drugs  Current medications and supplements including opioid prescriptions. Patient is not currently taking opioid prescriptions. Functional ability and status Nutritional status Physical activity Advanced directives List of other physicians Hospitalizations, surgeries, and ER visits in previous 12 months Vitals Screenings to include cognitive, depression, and falls Referrals and appointments  In addition, I have reviewed and discussed with patient certain preventive protocols, quality metrics, and best practice recommendations. A written personalized care plan for preventive services as well as general preventive health recommendations were provided to patient.     Criselda Peaches, LPN   7/32/2567    Nurse Notes: None

## 2022-05-04 ENCOUNTER — Ambulatory Visit: Payer: PPO

## 2022-05-04 ENCOUNTER — Ambulatory Visit (INDEPENDENT_AMBULATORY_CARE_PROVIDER_SITE_OTHER): Payer: PPO

## 2022-05-04 DIAGNOSIS — C61 Malignant neoplasm of prostate: Secondary | ICD-10-CM | POA: Diagnosis not present

## 2022-05-04 DIAGNOSIS — Z79818 Long term (current) use of other agents affecting estrogen receptors and estrogen levels: Secondary | ICD-10-CM | POA: Diagnosis not present

## 2022-05-04 DIAGNOSIS — C775 Secondary and unspecified malignant neoplasm of intrapelvic lymph nodes: Secondary | ICD-10-CM | POA: Diagnosis not present

## 2022-05-04 DIAGNOSIS — Z7901 Long term (current) use of anticoagulants: Secondary | ICD-10-CM

## 2022-05-04 LAB — POCT INR: INR: 2.4 (ref 2.0–3.0)

## 2022-05-04 NOTE — Patient Instructions (Addendum)
Pre visit review using our clinic review tool, if applicable. No additional management support is needed unless otherwise documented below in the visit note.  Continue to take 1 tablet ('5mg'$ ) daily EXCEPT for 1/2 (2.5 mg) tablet on Sundays and Wednesdays.  Recheck in 7 weeks.

## 2022-05-04 NOTE — Progress Notes (Cosign Needed Addendum)
Continue to take 1 tablet ('5mg'$ ) daily EXCEPT for 1/2 (2.5 mg) tablet on Sundays and Wednesdays.  Recheck in 7 weeks per pt request for being out of town.

## 2022-05-20 ENCOUNTER — Encounter: Payer: Self-pay | Admitting: Internal Medicine

## 2022-06-17 DIAGNOSIS — Z85828 Personal history of other malignant neoplasm of skin: Secondary | ICD-10-CM | POA: Diagnosis not present

## 2022-06-17 DIAGNOSIS — C44319 Basal cell carcinoma of skin of other parts of face: Secondary | ICD-10-CM | POA: Diagnosis not present

## 2022-06-17 DIAGNOSIS — Z85820 Personal history of malignant melanoma of skin: Secondary | ICD-10-CM | POA: Diagnosis not present

## 2022-06-24 ENCOUNTER — Ambulatory Visit (INDEPENDENT_AMBULATORY_CARE_PROVIDER_SITE_OTHER): Payer: PPO

## 2022-06-24 DIAGNOSIS — Z7901 Long term (current) use of anticoagulants: Secondary | ICD-10-CM

## 2022-06-24 LAB — POCT INR: INR: 3.3 — AB (ref 2.0–3.0)

## 2022-06-24 NOTE — Progress Notes (Signed)
Pt has taken dose today. Hold dose tomorrow and continue to take 1 tablet ('5mg'$ ) daily EXCEPT for 1/2 (2.5 mg) tablet on Sundays and Wednesdays.  Recheck in 4 weeks.

## 2022-06-24 NOTE — Patient Instructions (Addendum)
Pre visit review using our clinic review tool, if applicable. No additional management support is needed unless otherwise documented below in the visit note.  Hold dose tomorrow and continue to take 1 tablet ('5mg'$ ) daily EXCEPT for 1/2 (2.5 mg) tablet on Sundays and Wednesdays.  Recheck in 4 weeks.

## 2022-06-26 ENCOUNTER — Ambulatory Visit: Payer: PPO | Admitting: Internal Medicine

## 2022-06-26 ENCOUNTER — Encounter: Payer: Self-pay | Admitting: Internal Medicine

## 2022-06-26 VITALS — BP 132/72 | HR 35 | Ht 71.0 in | Wt 178.0 lb

## 2022-06-26 DIAGNOSIS — Z7901 Long term (current) use of anticoagulants: Secondary | ICD-10-CM

## 2022-06-26 DIAGNOSIS — Z86010 Personal history of colonic polyps: Secondary | ICD-10-CM | POA: Diagnosis not present

## 2022-06-26 NOTE — Progress Notes (Signed)
HISTORY OF PRESENT ILLNESS:  Donald Villegas is a 76 y.o. 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  He has been followed in this office for history of adenomatous and sessile serrated polyps.  Last examination 2016.  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  Otherwise normal.  He presents today regarding surveillance colonoscopy.  Sent to the office because of his chronic anticoagulation.  He reports to me that his overall health has been stable.  Recent surgery for basal cell carcinoma.  He continues on Coumadin.  His GI review of systems is entirely negative.  Blood work from April 25, 2021 was unremarkable.  Normal hemoglobin of 15.9.  He wonders if it is necessary for surveillance colonoscopy given his age, comorbidities, and asymptomatic state.  REVIEW OF SYSTEMS:  All non-GI ROS negative entirely  Past Medical History:  Diagnosis Date   Asthma    patient denies    Cancer ([place])    Clotting disorder ([place])    GERD (gastroesophageal reflux disease)    Glaucoma    sees Dr. Fredda Hammed   History of radiation therapy 12/14/13- 01/30/14   prostate bed 6600 cGy in 33 sessions   Pneumonia 2012   Prostate cancer ([place]) 2011   Gleason 6, 5/12 cores, sees Dr. Ardath Sax at [facility]    Pulmonary embolism [facility]) 2001, 2011   [facility] spotted fever 2006   "they think i had it "    Skin cancer    melanoma in situ, left arm, s/p local wide excision    Past Surgical History:  Procedure Laterality Date   COLONOSCOPY  07-10-15   per Dr. Henrene Pastor, benign polyp, repeat in 5 yrs    INGUINAL HERNIA REPAIR Right 2012   INGUINAL HERNIA REPAIR Left 07/29/2017   Procedure: OPEN LEFT INGUINAL [place], WITH MESH;  Surgeon: Alphonsa Overall, MD;  Location: [facility];  Service: General;  Laterality: Left;   KNEE ARTHROSCOPY Left 2010   MELANOMA EXCISION  Left 2000   PROSTATE BIOPSY  01/28/2009   gleason 3+3=6   PROSTATECTOMY  11/08/09   gleason 6   VENTRAL HERNIA REPAIR N/A 07/29/2017   Procedure: LAPAROSCOPIC VENTRAL HERNIA REPAIR WITH MESH;  Surgeon: Alphonsa Overall, MD;  Location: [facility];  Service: General;  Laterality: N/A;    Social History [name]  reports that he quit smoking about 53 years ago. His smoking use included cigars. He has never used smokeless tobacco. He reports current alcohol use of about 7.0 standard drinks of alcohol per week. He reports that he does not use drugs.  family history includes Colon cancer (age of onset: 36) in his paternal grandmother; Prostate cancer (age of onset: 21) in his paternal grandfather.  No Known Allergies     PHYSICAL EXAMINATION: Vital signs: BP 132/72   Pulse (!) 35   Ht '5\' 11"'$  (1.803 m)   Wt 178 lb (80.7 kg)   BMI 24.83 kg/m   Constitutional: generally well-appearing, no acute distress Psychiatric: alert and oriented x3, cooperative Eyes: extraocular movements intact, anicteric, conjunctiva pink Mouth: oral pharynx moist, no lesions Neck: supple no lymphadenopathy Cardiovascular: heart regular rate and rhythm, no murmur Lungs: clear to auscultation bilaterally Abdomen: soft, nontender, nondistended, no obvious ascites, no peritoneal signs, normal bowel sounds, no organomegaly Rectal: Omitted Extremities: no clubbing, cyanosis, or lower extremity edema bilaterally  Skin: no lesions on visible extremities.  Does have a bandage on his head from recent surgery and small hematoma under the left eye Neuro: No focal deficits.  Cranial nerves intact  ASSESSMENT:  1.  History of nonadvanced adenoma and small sessile serrated polyp.  Last colonoscopy 2016. 2.  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  Personal history of prostate cancer, melanoma remotely, and recurrent basal cell carcinoma. 4.  History of prostate cancer, being followed at [facility] with 5.  History of DVT with  pulmonary embolism on chronic Coumadin  PLAN:  1.  After full dialysis discussion, we discussed the pros and cons of surveillance colonoscopy.  We discussed the most recent revised guidelines.  We mutually decided to forego future routine surveillance.  He certainly knows to contact me if he were to develop concerning signs or symptoms.

## 2022-06-26 NOTE — Patient Instructions (Addendum)
If you are age 78 or older, your body mass index should be between 23-30. Your Body mass index is 24.83 kg/m. If this is out of the aforementioned range listed, please consider follow up with your Primary Care Provider.  __________________________________________________________  The [place] [facility] providers would like to encourage you to use [facility] to communicate with providers for non-urgent requests or questions.  Due to long hold times on the telephone, sending your provider a message by [facility] may be a faster and more efficient way to get a response.  Please allow 48 business hours for a response.  Please remember that this is for non-urgent requests.   Due to recent changes in healthcare laws, you may see the results of your imaging and laboratory studies on MyChart before your provider has had a chance to review them.  We understand that in some cases there may be results that are confusing or concerning to you. Not all laboratory results come back in the same time frame and the provider may be waiting for multiple results in order to interpret others.  Please give [place] 48 hours in order for your provider to thoroughly review all the results before contacting the office for clarification of your results.   Follow up as needed.   Thank you for choosing me and [facility].

## 2022-07-02 DIAGNOSIS — H5201 Hypermetropia, right eye: Secondary | ICD-10-CM | POA: Diagnosis not present

## 2022-07-02 DIAGNOSIS — H2513 Age-related nuclear cataract, bilateral: Secondary | ICD-10-CM | POA: Diagnosis not present

## 2022-07-02 DIAGNOSIS — H40053 Ocular hypertension, bilateral: Secondary | ICD-10-CM | POA: Diagnosis not present

## 2022-07-22 ENCOUNTER — Ambulatory Visit (INDEPENDENT_AMBULATORY_CARE_PROVIDER_SITE_OTHER): Payer: PPO

## 2022-07-22 DIAGNOSIS — Z7901 Long term (current) use of anticoagulants: Secondary | ICD-10-CM

## 2022-07-22 LAB — POCT INR: INR: 2.8 (ref 2.0–3.0)

## 2022-07-22 NOTE — Patient Instructions (Signed)
Continue to take 1 tablet ('5mg'$ ) daily EXCEPT for 1/2 tablet (2.5 mg) tablet on Sundays and Wednesdays.  Recheck in 6 weeks, on 08/31/22 at 8:00am.

## 2022-07-22 NOTE — Progress Notes (Signed)
Continue to take 1 tablet ('5mg'$ ) daily EXCEPT for 1/2 tablet (2.5 mg) tablet on Sundays and Wednesdays.  Recheck in 6 weeks, on 08/31/22 at 8:00am.

## 2022-07-23 DIAGNOSIS — C44629 Squamous cell carcinoma of skin of left upper limb, including shoulder: Secondary | ICD-10-CM | POA: Diagnosis not present

## 2022-07-23 DIAGNOSIS — D485 Neoplasm of uncertain behavior of skin: Secondary | ICD-10-CM | POA: Diagnosis not present

## 2022-07-23 DIAGNOSIS — Z85820 Personal history of malignant melanoma of skin: Secondary | ICD-10-CM | POA: Diagnosis not present

## 2022-07-23 DIAGNOSIS — Z85828 Personal history of other malignant neoplasm of skin: Secondary | ICD-10-CM | POA: Diagnosis not present

## 2022-07-27 DIAGNOSIS — H401131 Primary open-angle glaucoma, bilateral, mild stage: Secondary | ICD-10-CM | POA: Diagnosis not present

## 2022-08-12 DIAGNOSIS — C775 Secondary and unspecified malignant neoplasm of intrapelvic lymph nodes: Secondary | ICD-10-CM | POA: Diagnosis not present

## 2022-08-12 DIAGNOSIS — C61 Malignant neoplasm of prostate: Secondary | ICD-10-CM | POA: Diagnosis not present

## 2022-08-31 ENCOUNTER — Ambulatory Visit (INDEPENDENT_AMBULATORY_CARE_PROVIDER_SITE_OTHER): Payer: PPO

## 2022-08-31 DIAGNOSIS — Z7901 Long term (current) use of anticoagulants: Secondary | ICD-10-CM

## 2022-08-31 LAB — POCT INR: INR: 2.8 (ref 2.0–3.0)

## 2022-08-31 NOTE — Progress Notes (Signed)
Continue to take 1 tablet ('5mg'$ ) daily EXCEPT for 1/2 tablet (2.5 mg) tablet on Sundays and Wednesdays.  Recheck in 8 weeks, on 10/26/21 at 8:00am.

## 2022-08-31 NOTE — Patient Instructions (Signed)
Continue to take 1 tablet ('5mg'$ ) daily EXCEPT for 1/2 tablet (2.5 mg) tablet on Sundays and Wednesdays.  Recheck on 10/26/21 at 8:00am.

## 2022-09-07 ENCOUNTER — Telehealth: Payer: Self-pay

## 2022-09-07 NOTE — Telephone Encounter (Signed)
-  Caller states he is wanting to know what the symptoms are for RSV and what is the treatment. Pt has a cough with clear milky mucus and this cough has been present for the past 5 days. Denies fever.  09/07/2022 [place], RN, Deborah  Comments User: Nelwyn Salisbury, RN Date/Time Eilene Ghazi Time): 09/07/2022 9:08:31 AM Unable to finish reading the care advice to the patient , He had to disconnect from the call. Instructed pt to call back if he becomes worse.  Pt has appt scheduled with PCP 09/08/22 at [place] Advice Given Per Guideline HOME CARE: * You should be able to treat this at home. REASSURANCE AND EDUCATION - COUGH: * It doesn't sound like a serious cough. * Coughing up mucus is very important for protecting the lungs from pneumonia. HUMIDIFIER: * If the air is dry, use a humidifier in the bedroom. * Dry air makes coughs worse. AVOID TOBACCO SMOKE: * Avoid tobacco smoke and e-cigarettes. * Smoking or being exposed to smoke makes coughs much worse. DRINK PLENTY OF LIQUIDS: * Drink plenty of liquids. * Staying well-hydrated will help loosen phlegm. * The liquids will also help soothe a dry or irritated throat. COUGH MEDICINES: * COUGH DROPS: Over-the-counter cough drops can help a lot, especially for mild coughs. They soothe an irritated throat and remove the tickle sensation in the back of the throat. Cough drops are easy to carry with you. * HOME REMEDY - HONEY: This old home remedy has been shown to help decrease coughing at night. The adult dosage is 2 teaspoons (10 ml) at bedtime. CALL BACK IF: * Fever over 103 F (39.4 C) * Difficulty breathing occurs CARE ADVICE given per Cough - Acute Productive (Adult) guideline. * You become worse

## 2022-09-08 ENCOUNTER — Encounter: Payer: Self-pay | Admitting: Family Medicine

## 2022-09-08 ENCOUNTER — Ambulatory Visit (INDEPENDENT_AMBULATORY_CARE_PROVIDER_SITE_OTHER): Payer: PPO | Admitting: Family Medicine

## 2022-09-08 VITALS — BP 118/64 | HR 62 | Temp 98.5°F | Ht 71.0 in | Wt 177.1 lb

## 2022-09-08 DIAGNOSIS — J40 Bronchitis, not specified as acute or chronic: Secondary | ICD-10-CM | POA: Diagnosis not present

## 2022-09-08 MED ORDER — AZITHROMYCIN 250 MG PO TABS: ORAL_TABLET | ORAL | 0 refills | Status: DC

## 2022-09-08 NOTE — Progress Notes (Signed)
   Subjective:    Patient ID: Donald Villegas, male    DOB: 08-13-1946, 76 y.o.   MRN: [id number]  HPI Here for one week of chest congestion and coughing up clear sputum. No fever or chest pain or SOB.    Review of Systems  Constitutional: Negative.   HENT: Negative.    Eyes: Negative.   Respiratory:  Positive for cough and chest tightness. Negative for shortness of breath and wheezing.        Objective:   Physical Exam Constitutional:      Appearance: Normal appearance. He is not ill-appearing.  HENT:     Right Ear: Tympanic membrane, ear canal and external ear normal.     Left Ear: Tympanic membrane, ear canal and external ear normal.     Nose: Nose normal.     Mouth/Throat:     Pharynx: Oropharynx is clear.  Eyes:     Conjunctiva/sclera: Conjunctivae normal.  Pulmonary:     Effort: Pulmonary effort is normal.     Breath sounds: Rhonchi present. No wheezing or rales.  Lymphadenopathy:     Cervical: No cervical adenopathy.  Neurological:     Mental Status: He is alert.           Assessment & Plan:  Early bronchitis, treat with a Zpack.  Alysia Penna, MD

## 2022-09-14 DIAGNOSIS — H40053 Ocular hypertension, bilateral: Secondary | ICD-10-CM | POA: Diagnosis not present

## 2022-09-21 ENCOUNTER — Telehealth: Payer: Self-pay | Admitting: Family Medicine

## 2022-09-21 ENCOUNTER — Other Ambulatory Visit: Payer: Self-pay

## 2022-09-21 DIAGNOSIS — Z7901 Long term (current) use of anticoagulants: Secondary | ICD-10-CM

## 2022-09-21 MED ORDER — AZITHROMYCIN 250 MG PO TABS: ORAL_TABLET | ORAL | 0 refills | Status: DC

## 2022-09-21 NOTE — Telephone Encounter (Signed)
Call in another Zpack  

## 2022-09-21 NOTE — Telephone Encounter (Signed)
Called patient to inform, prescription for Z-pack is sent to [facility] on [address].

## 2022-09-21 NOTE — Telephone Encounter (Signed)
Patient was treated on 09/08/22 with azithromycin (ZITHROMAX Z-PAK) 250 MG tablet , still has residual cough. Asking if a refill can be sent to take care of residual cough

## 2022-09-29 ENCOUNTER — Ambulatory Visit: Payer: PPO | Admitting: Family Medicine

## 2022-10-04 ENCOUNTER — Encounter: Payer: Self-pay | Admitting: Family Medicine

## 2022-10-05 NOTE — Telephone Encounter (Signed)
Spoke with pt verbalized understanding 

## 2022-10-05 NOTE — Telephone Encounter (Signed)
I appreciate his concerns. Tell him that I have ordered a CT scan of the abdomen and pelvis. They will call to schedule this

## 2022-10-26 ENCOUNTER — Ambulatory Visit (INDEPENDENT_AMBULATORY_CARE_PROVIDER_SITE_OTHER): Payer: PPO

## 2022-10-26 DIAGNOSIS — Z7901 Long term (current) use of anticoagulants: Secondary | ICD-10-CM | POA: Diagnosis not present

## 2022-10-26 LAB — POCT INR: INR: 2.3 (ref 2.0–3.0)

## 2022-10-26 NOTE — Patient Instructions (Addendum)
Pre visit review using our clinic review tool, if applicable. No additional management support is needed unless otherwise documented below in the visit note.  Continue to take 1 tablet (5mg) daily EXCEPT for 1/2 tablet (2.5 mg) tablet on Sundays and Wednesdays.  Recheck in 8 weeks. 

## 2022-10-26 NOTE — Progress Notes (Signed)
Continue to take 1 tablet ('5mg'$ ) daily EXCEPT for 1/2 tablet (2.5 mg) tablet on Sundays and Wednesdays.  Recheck in 8 weeks.

## 2022-10-27 DIAGNOSIS — H40053 Ocular hypertension, bilateral: Secondary | ICD-10-CM | POA: Diagnosis not present

## 2022-10-29 DIAGNOSIS — L905 Scar conditions and fibrosis of skin: Secondary | ICD-10-CM | POA: Diagnosis not present

## 2022-10-29 DIAGNOSIS — L738 Other specified follicular disorders: Secondary | ICD-10-CM | POA: Diagnosis not present

## 2022-10-29 DIAGNOSIS — D225 Melanocytic nevi of trunk: Secondary | ICD-10-CM | POA: Diagnosis not present

## 2022-10-29 DIAGNOSIS — L817 Pigmented purpuric dermatosis: Secondary | ICD-10-CM | POA: Diagnosis not present

## 2022-10-29 DIAGNOSIS — L821 Other seborrheic keratosis: Secondary | ICD-10-CM | POA: Diagnosis not present

## 2022-10-29 DIAGNOSIS — Z85820 Personal history of malignant melanoma of skin: Secondary | ICD-10-CM | POA: Diagnosis not present

## 2022-10-29 DIAGNOSIS — Z85828 Personal history of other malignant neoplasm of skin: Secondary | ICD-10-CM | POA: Diagnosis not present

## 2022-11-04 DIAGNOSIS — C775 Secondary and unspecified malignant neoplasm of intrapelvic lymph nodes: Secondary | ICD-10-CM | POA: Diagnosis not present

## 2022-11-04 DIAGNOSIS — Z79818 Long term (current) use of other agents affecting estrogen receptors and estrogen levels: Secondary | ICD-10-CM | POA: Diagnosis not present

## 2022-11-04 DIAGNOSIS — C61 Malignant neoplasm of prostate: Secondary | ICD-10-CM | POA: Diagnosis not present

## 2022-12-21 ENCOUNTER — Ambulatory Visit (INDEPENDENT_AMBULATORY_CARE_PROVIDER_SITE_OTHER): Payer: PPO

## 2022-12-21 DIAGNOSIS — Z7901 Long term (current) use of anticoagulants: Secondary | ICD-10-CM

## 2022-12-21 LAB — POCT INR: INR: 2.7 (ref 2.0–3.0)

## 2022-12-21 NOTE — Progress Notes (Signed)
Continue to take 1 tablet ('5mg'$ ) daily EXCEPT for 1/2 tablet (2.5 mg) tablet on Sundays and Wednesdays.  Recheck in 8 weeks per pt request.

## 2022-12-21 NOTE — Patient Instructions (Addendum)
Pre visit review using our clinic review tool, if applicable. No additional management support is needed unless otherwise documented below in the visit note.  Continue to take 1 tablet ('5mg'$ ) daily EXCEPT for 1/2 tablet (2.5 mg) tablet on Sundays and Wednesdays.  Recheck in 8 weeks.

## 2023-01-05 ENCOUNTER — Other Ambulatory Visit: Payer: Self-pay | Admitting: Family Medicine

## 2023-01-05 DIAGNOSIS — Z7901 Long term (current) use of anticoagulants: Secondary | ICD-10-CM

## 2023-01-06 NOTE — Telephone Encounter (Signed)
Pt is compliant with warfarin management and PCP apts.  Sent in refill of warfarin to requested pharmacy.      

## 2023-02-03 DIAGNOSIS — Z85828 Personal history of other malignant neoplasm of skin: Secondary | ICD-10-CM | POA: Diagnosis not present

## 2023-02-03 DIAGNOSIS — L820 Inflamed seborrheic keratosis: Secondary | ICD-10-CM | POA: Diagnosis not present

## 2023-02-03 DIAGNOSIS — Z85820 Personal history of malignant melanoma of skin: Secondary | ICD-10-CM | POA: Diagnosis not present

## 2023-02-10 DIAGNOSIS — C61 Malignant neoplasm of prostate: Secondary | ICD-10-CM | POA: Diagnosis not present

## 2023-02-10 DIAGNOSIS — C775 Secondary and unspecified malignant neoplasm of intrapelvic lymph nodes: Secondary | ICD-10-CM | POA: Diagnosis not present

## 2023-02-15 ENCOUNTER — Ambulatory Visit (INDEPENDENT_AMBULATORY_CARE_PROVIDER_SITE_OTHER): Payer: PPO

## 2023-02-15 DIAGNOSIS — Z7901 Long term (current) use of anticoagulants: Secondary | ICD-10-CM | POA: Diagnosis not present

## 2023-02-15 LAB — POCT INR: INR: 2.9 (ref 2.0–3.0)

## 2023-02-15 NOTE — Patient Instructions (Addendum)
Pre visit review using our clinic review tool, if applicable. No additional management support is needed unless otherwise documented below in the visit note.  Continue to take 1 tablet (5mg ) daily EXCEPT for 1/2 tablet (2.5 mg) tablet on Sundays and Wednesdays.  Recheck in 8 weeks per pt request.

## 2023-02-15 NOTE — Progress Notes (Signed)
Continue to take 1 tablet (5mg) daily EXCEPT for 1/2 tablet (2.5 mg) tablet on Sundays and Wednesdays.  Recheck in 8 weeks per pt request. 

## 2023-03-22 DIAGNOSIS — L821 Other seborrheic keratosis: Secondary | ICD-10-CM | POA: Diagnosis not present

## 2023-03-22 DIAGNOSIS — L570 Actinic keratosis: Secondary | ICD-10-CM | POA: Diagnosis not present

## 2023-03-22 DIAGNOSIS — Z85820 Personal history of malignant melanoma of skin: Secondary | ICD-10-CM | POA: Diagnosis not present

## 2023-03-22 DIAGNOSIS — Z85828 Personal history of other malignant neoplasm of skin: Secondary | ICD-10-CM | POA: Diagnosis not present

## 2023-03-22 DIAGNOSIS — D225 Melanocytic nevi of trunk: Secondary | ICD-10-CM | POA: Diagnosis not present

## 2023-03-23 DIAGNOSIS — H401131 Primary open-angle glaucoma, bilateral, mild stage: Secondary | ICD-10-CM | POA: Diagnosis not present

## 2023-03-23 DIAGNOSIS — H04123 Dry eye syndrome of bilateral lacrimal glands: Secondary | ICD-10-CM | POA: Diagnosis not present

## 2023-04-12 ENCOUNTER — Ambulatory Visit (INDEPENDENT_AMBULATORY_CARE_PROVIDER_SITE_OTHER): Payer: PPO

## 2023-04-12 DIAGNOSIS — Z7901 Long term (current) use of anticoagulants: Secondary | ICD-10-CM | POA: Diagnosis not present

## 2023-04-12 LAB — POCT INR: INR: 3 (ref 2.0–3.0)

## 2023-04-12 NOTE — Progress Notes (Signed)
Continue to take 1 tablet (5mg) daily EXCEPT for 1/2 tablet (2.5 mg) tablet on Sundays and Wednesdays.  Recheck in 8 weeks per pt request. 

## 2023-04-12 NOTE — Patient Instructions (Addendum)
Pre visit review using our clinic review tool, if applicable. No additional management support is needed unless otherwise documented below in the visit note.  Continue to take 1 tablet (5mg) daily EXCEPT for 1/2 tablet (2.5 mg) tablet on Sundays and Wednesdays.  Recheck in 8 weeks per pt request. 

## 2023-05-12 DIAGNOSIS — C61 Malignant neoplasm of prostate: Secondary | ICD-10-CM | POA: Diagnosis not present

## 2023-05-12 DIAGNOSIS — Z79818 Long term (current) use of other agents affecting estrogen receptors and estrogen levels: Secondary | ICD-10-CM | POA: Diagnosis not present

## 2023-05-12 DIAGNOSIS — C775 Secondary and unspecified malignant neoplasm of intrapelvic lymph nodes: Secondary | ICD-10-CM | POA: Diagnosis not present

## 2023-05-19 ENCOUNTER — Encounter (INDEPENDENT_AMBULATORY_CARE_PROVIDER_SITE_OTHER): Payer: Self-pay

## 2023-06-07 ENCOUNTER — Ambulatory Visit (INDEPENDENT_AMBULATORY_CARE_PROVIDER_SITE_OTHER): Payer: PPO

## 2023-06-07 DIAGNOSIS — Z7901 Long term (current) use of anticoagulants: Secondary | ICD-10-CM

## 2023-06-07 LAB — POCT INR: INR: 2.9 (ref 2.0–3.0)

## 2023-06-07 NOTE — Patient Instructions (Addendum)
Pre visit review using our clinic review tool, if applicable. No additional management support is needed unless otherwise documented below in the visit note.  Continue to take 1 tablet ('5mg'$ ) daily EXCEPT for 1/2 tablet (2.5 mg) tablet on Sundays and Wednesdays.  Recheck in 8 weeks.

## 2023-06-07 NOTE — Progress Notes (Signed)
Continue to take 1 tablet (5mg) daily EXCEPT for 1/2 tablet (2.5 mg) tablet on Sundays and Wednesdays.  Recheck in 8 weeks per pt request. 

## 2023-06-10 ENCOUNTER — Encounter: Payer: Self-pay | Admitting: Family Medicine

## 2023-06-11 ENCOUNTER — Encounter: Payer: Self-pay | Admitting: Family

## 2023-06-11 ENCOUNTER — Telehealth (INDEPENDENT_AMBULATORY_CARE_PROVIDER_SITE_OTHER): Payer: PPO | Admitting: Family

## 2023-06-11 VITALS — Ht 71.0 in | Wt 175.0 lb

## 2023-06-11 DIAGNOSIS — U071 COVID-19: Secondary | ICD-10-CM | POA: Diagnosis not present

## 2023-06-11 NOTE — Progress Notes (Signed)
MyChart Video Visit    Virtual Visit via Video Note   This format is felt to be most appropriate for this patient at this time. Physical exam was limited by quality of the video and audio technology used for the visit. CMA was able to get the patient set up on a video visit.  Patient location: Home. Patient and provider in visit Provider location: Office  I discussed the limitations of evaluation and management by telemedicine and the availability of in person appointments. The patient expressed understanding and agreed to proceed.  Visit Date: 06/11/2023  Today's healthcare provider: Dulce Sellar, NP     Subjective:   Patient ID: Donald Villegas, male    DOB: Mar 08, 1946, 77 y.o.   MRN: [id number]  Chief Complaint  Patient presents with   Covid Positive    sx for 5d    HPI Covid positive:  Pt tested positive for Covid at home 2 days. Sx include runny nose and body aches which has resolved. Sx started Sunday. Has tried tylenol and benadryl which did help. Pt states he feels better today.   Assessment & Plan:  COVID-19- Pt feeling better today, sx similar to a mild cold. Advised of CDC guidelines for masking if out in public, today should be last day of masking needed. OK to continue taking OTC sinus or pain meds. Encouraged to monitor & notify office of any worsening symptoms: increased shortness of breath, weakness, and signs of dehydration. Instructed to rest and hydrate well.    Past Medical History:  Diagnosis Date   Asthma    patient denies    Cancer [facility])    Clotting disorder ([facility])    GERD (gastroesophageal reflux disease)    Glaucoma    sees Dr. Linton Rump   History of radiation therapy 12/14/13- 01/30/14   prostate bed 6600 cGy in 33 sessions   Pneumonia 2012   Prostate cancer [facility]) 2011   Gleason 6, 5/12 cores, sees Dr. Cherylin Mylar at [facility]    Pulmonary embolism [facility]) 2001, 2011   [facility] spotted fever 2006   "they think i had it  "    Skin cancer    melanoma in situ, left arm, s/p local wide excision    Past Surgical History:  Procedure Laterality Date   COLONOSCOPY  07-10-15   per Dr. Marina Goodell, benign polyp, repeat in 5 yrs    INGUINAL HERNIA REPAIR Right 2012   INGUINAL HERNIA REPAIR Left 07/29/2017   Procedure: OPEN LEFT INGUINAL HERNIA REPAIR ERAS PATHWAY, WITH MESH;  Surgeon: Ovidio Kin, MD;  Location: [facility];  Service: General;  Laterality: Left;   KNEE ARTHROSCOPY Left 2010   MELANOMA EXCISION Left 2000   PROSTATE BIOPSY  01/28/2009   gleason 3+3=6   PROSTATECTOMY  11/08/09   gleason 6   VENTRAL HERNIA REPAIR N/A 07/29/2017   Procedure: LAPAROSCOPIC VENTRAL HERNIA REPAIR WITH MESH;  Surgeon: Ovidio Kin, MD;  Location: [facility];  Service: General;  Laterality: N/A;    Outpatient Medications Prior to Visit  Medication Sig Dispense Refill   doxycycline (MONODOX) 50 MG capsule Take 50 mg by mouth 2 (two) times daily.     doxycycline (VIBRA-TABS) 100 MG tablet      erythromycin ophthalmic ointment      famotidine (PEPCID) 20 MG tablet Take 10 mg by mouth as needed.     latanoprost (XALATAN) 0.005 % ophthalmic solution 1 drop at bedtime.     Leuprolide Acetate,  6 Month, (LUPRON DEPOT, 23-MONTH,) 45 MG injection Inject 45 mg into the muscle every 6 (six) months. 1 each 1   loteprednol (LOTEMAX) 0.5 % ophthalmic suspension 1 drop 3 (three) times daily.     valACYclovir (VALTREX) 1000 MG tablet Take 1,000 mg by mouth 3 (three) times daily.     valACYclovir (VALTREX) 1000 MG tablet Take by mouth.     warfarin (COUMADIN) 5 MG tablet TAKE 1 TABLET BY MOUTH DAILY EXCEPT TAKE 1/2 TABLET ON SUNDAYS AND WEDNESDAYS OR AS DIRECTED BY ANTICOAGULATION CLINIC 105 tablet 1   azithromycin (ZITHROMAX Z-PAK) 250 MG tablet As directed 6 each 0   No facility-administered medications prior to visit.    No Known Allergies     Objective:   Physical Exam Vitals and nursing note reviewed.  Constitutional:      General: Pt  is not in acute distress.    Appearance: Normal appearance.  HENT:     Head: Normocephalic.  Pulmonary:     Effort: No respiratory distress.  Musculoskeletal:     Cervical back: Normal range of motion.  Skin:    General: Skin is dry.     Coloration: Skin is not pale.  Neurological:     Mental Status: Pt is alert and oriented to person, place, and time.  Psychiatric:        Mood and Affect: Mood normal.   Ht 5\' 11"  (1.803 m)   Wt 175 lb (79.4 kg)   BMI 24.41 kg/m   Wt Readings from Last 3 Encounters:  06/11/23 175 lb (79.4 kg)  09/08/22 177 lb 2 oz (80.3 kg)  06/26/22 178 lb (80.7 kg)        I discussed the assessment and treatment plan with the patient. The patient was provided an opportunity to ask questions and all were answered. The patient agreed with the plan and demonstrated an understanding of the instructions.   The patient was advised to call back or seek an in-person evaluation if the symptoms worsen or if the condition fails to improve as anticipated.  Dulce Sellar, NP [facility] [phone] (phone) [phone] (fax)  [facility]

## 2023-06-25 ENCOUNTER — Ambulatory Visit (INDEPENDENT_AMBULATORY_CARE_PROVIDER_SITE_OTHER): Payer: PPO

## 2023-06-25 VITALS — Ht 71.0 in | Wt 175.0 lb

## 2023-06-25 DIAGNOSIS — Z0000 Encounter for general adult medical examination without abnormal findings: Secondary | ICD-10-CM

## 2023-06-25 NOTE — Patient Instructions (Addendum)
Donald Villegas , Thank you for taking time to come for your Medicare Wellness Visit. I appreciate your ongoing commitment to your health goals. Please review the following plan we discussed and let me know if I can assist you in the future.   Referrals/Orders/Follow-Ups/Clinician Recommendations:   This is a list of the screening recommended for you and due dates:  Health Maintenance  Topic Date Due   Hepatitis C Screening  Never done   Colon Cancer Screening  07/09/2020   DTaP/Tdap/Td vaccine (2 - Td or Tdap) 08/19/2021   Flu Shot  05/20/2023   COVID-19 Vaccine (4 - 2023-24 season) 06/20/2023   Zoster (Shingles) Vaccine (1 of 2) 09/18/2023*   Medicare Annual Wellness Visit  06/24/2024   Pneumonia Vaccine  Completed   HPV Vaccine  Aged Out  *Topic was postponed. The date shown is not the original due date.    Advanced directives: (Copy Requested) Please bring a copy of your health care power of attorney and living will to the office to be added to your chart at your convenience.  Next Medicare Annual Wellness Visit scheduled for next year: Yes

## 2023-06-25 NOTE — Progress Notes (Signed)
Subjective:   Donald Villegas is a 77 y.o. male who presents for Medicare Annual/Subsequent preventive examination.  Visit Complete: Virtual  I connected with  Donald Villegas on 06/25/23 by a audio enabled telemedicine application and verified that I am speaking with the correct person using two identifiers.  Patient Location: Home  Provider Location: Home Office  I discussed the limitations of evaluation and management by telemedicine. The patient expressed understanding and agreed to proceed.     Review of Systems    Vital Signs: Unable to obtain new vitals due to this being a telehealth visit.  Cardiac Risk Factors include: advanced age (>64men, >16 women);male gender     Objective:    Today's Vitals   06/25/23 0936 06/25/23 0937  Weight: 175 lb (79.4 kg)   Height: 5\' 11"  (1.803 m)   PainSc:  0-No pain   Body mass index is 24.41 kg/m.     06/25/2023    9:42 AM 04/28/2022   10:07 AM 04/25/2021    8:33 AM 07/29/2017    1:00 PM 07/29/2017    7:49 AM 07/22/2017    8:29 AM 12/22/2016    7:20 AM  Advanced Directives  Does Patient Have a Medical Advance Directive? Yes Yes Yes No No No No  Type of Estate agent of [place];Living will Healthcare Power of [place];Living will Healthcare Power of [place];Living will      Does patient want to make changes to medical advance directive?  No - Patient declined       Copy of Healthcare Power of Attorney in Chart? No - copy requested No - copy requested No - copy requested      Would patient like information on creating a medical advance directive?    No - Patient declined No - Patient declined No - Patient declined     Current Medications (verified) Outpatient Encounter Medications as of 06/25/2023  Medication Sig   doxycycline (MONODOX) 50 MG capsule Take 50 mg by mouth 2 (two) times daily.   doxycycline (VIBRA-TABS) 100 MG tablet    erythromycin ophthalmic ointment    famotidine (PEPCID) 20 MG tablet Take 10  mg by mouth as needed.   latanoprost (XALATAN) 0.005 % ophthalmic solution 1 drop at bedtime.   Leuprolide Acetate, 6 Month, (LUPRON DEPOT, 16-MONTH,) 45 MG injection Inject 45 mg into the muscle every 6 (six) months.   loteprednol (LOTEMAX) 0.5 % ophthalmic suspension 1 drop 3 (three) times daily.   valACYclovir (VALTREX) 1000 MG tablet Take 1,000 mg by mouth 3 (three) times daily.   valACYclovir (VALTREX) 1000 MG tablet Take by mouth.   warfarin (COUMADIN) 5 MG tablet TAKE 1 TABLET BY MOUTH DAILY EXCEPT TAKE 1/2 TABLET ON SUNDAYS AND WEDNESDAYS OR AS DIRECTED BY ANTICOAGULATION CLINIC   No facility-administered encounter medications on file as of 06/25/2023.    Allergies (verified) Patient has no known allergies.   History: Past Medical History:  Diagnosis Date   Asthma    patient denies    Cancer [facility])    Clotting disorder (HCC)    GERD (gastroesophageal reflux disease)    Glaucoma    sees Dr. 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 Cherylin Mylar at [facility]    Pulmonary embolism [facility]) 2001, 2011   [facility] spotted fever  2006   "they think i had it "    Skin cancer    melanoma in situ, left arm, s/p local wide excision   Past Surgical History:  Procedure Laterality Date   COLONOSCOPY  07-10-15   per Dr. Marina Goodell, benign polyp, repeat in 5 yrs    INGUINAL HERNIA REPAIR Right 2012   INGUINAL HERNIA REPAIR Left 07/29/2017   Procedure: OPEN LEFT INGUINAL HERNIA REPAIR ERAS PATHWAY, WITH MESH;  Surgeon: Ovidio Kin, MD;  Location: [facility];  Service: General;  Laterality: Left;   KNEE ARTHROSCOPY Left 2010   MELANOMA EXCISION Left 2000   PROSTATE BIOPSY  01/28/2009   gleason 3+3=6   PROSTATECTOMY  11/08/09   gleason 6   VENTRAL HERNIA REPAIR N/A 07/29/2017   Procedure: LAPAROSCOPIC VENTRAL HERNIA REPAIR WITH MESH;  Surgeon:  Ovidio Kin, MD;  Location: [facility];  Service: General;  Laterality: N/A;   Family History  Problem Relation Age of Onset   Colon cancer Paternal Grandmother 27   Prostate cancer Paternal Grandfather 89   Esophageal cancer Neg Hx    Rectal cancer Neg Hx    Stomach cancer Neg Hx    Social History   Socioeconomic History   Marital status: Married    Spouse name: Not on file   Number of children: Not on file   Years of education: Not on file   Highest education level: Not on file  Occupational History   Not on file  Tobacco Use   Smoking status: Former    Types: Cigars    Quit date: 11/27/1968    Years since quitting: 54.6   Smokeless tobacco: Never   Tobacco comments:    every 3  weeks socially   Substance and Sexual Activity   Alcohol use: Yes    Alcohol/week: 7.0 standard drinks of alcohol    Types: 7 Shots of liquor per week    Comment: scotch usually every day   Drug use: No   Sexual activity: Not on file  Other Topics Concern   Not on file  Social History Narrative   Not on file   Social Determinants of Health   Financial Resource Strain: Low Risk  (06/25/2023)   Overall Financial Resource Strain (CARDIA)    Difficulty of Paying Living Expenses: Not hard at all  Food Insecurity: No Food Insecurity (06/25/2023)   Hunger Vital Sign    Worried About Running Out of Food in the Last Year: Never true    Ran Out of Food in the Last Year: Never true  Transportation Needs: No Transportation Needs (04/28/2022)   PRAPARE - Administrator, Civil Service (Medical): No    Lack of Transportation (Non-Medical): No  Physical Activity: Sufficiently Active (06/25/2023)   Exercise Vital Sign    Days of Exercise per Week: 7 days    Minutes of Exercise per Session: 60 min  Stress: No Stress Concern Present (06/25/2023)   [employer] of Occupational Health - Occupational Stress Questionnaire    Feeling of Stress : Not at all  Social Connections: Socially Integrated  (06/25/2023)   Social Connection and Isolation Panel [NHANES]    Frequency of Communication with Friends and Family: More than three times a week    Frequency of Social Gatherings with Friends and Family: More than three times a week    Attends Religious Services: More than 4 times per year    Active Member of Clubs or Organizations: Yes    Attends  Engineer, structural: More than 4 times per year    Marital Status: Married    Tobacco Counseling Counseling given: Not Answered Tobacco comments: every 3  weeks socially    Clinical Intake:  Pre-visit preparation completed: Yes  Pain : No/denies pain Pain Score: 0-No pain     BMI - recorded: 24.41 Nutritional Status: BMI of 19-24  Normal Nutritional Risks: None Diabetes: No  How often do you need to have someone help you when you read instructions, pamphlets, or other written materials from your doctor or pharmacy?: 1 - Never  Interpreter Needed?: No  Information entered by :: Theresa Mulligan LPN   Activities of Daily Living    06/25/2023    9:41 AM  In your present state of health, do you have any difficulty performing the following activities:  Hearing? 0  Vision? 0  Difficulty concentrating or making decisions? 0  Walking or climbing stairs? 0  Dressing or bathing? 0  Doing errands, shopping? 0  Preparing Food and eating ? N  Using the Toilet? N  In the past six months, have you accidently leaked urine? N  Do you have problems with loss of bowel control? N  Managing your Medications? N  Managing your Finances? N  Housekeeping or managing your Housekeeping? N    Patient Care Team: Nelwyn Salisbury, MD as PCP - General (Family Medicine) Verner Chol, [facility] (Inactive) as Pharmacist (Pharmacist)  Indicate any recent Medical Services you may have received from other than [facility] providers in the past year (date may be approximate).     Assessment:   This is a routine wellness examination for  Najae.  Hearing/Vision screen Hearing Screening - Comments:: Denies hearing difficulties   Vision Screening - Comments:: Wears rx glasses - up to date with routine eye exams with  Dr Cathey Endow   Goals Addressed               This Visit's Progress     Stay Healthy (pt-stated)         Depression Screen    06/25/2023    9:41 AM 09/08/2022    8:47 AM 04/28/2022   10:04 AM 04/25/2021    8:34 AM 04/25/2021    8:31 AM 01/04/2020    9:59 AM 12/02/2016   10:40 AM  PHQ 2/9 Scores  PHQ - 2 Score 0 0 0 0 0 0 0  PHQ- 9 Score  1         Fall Risk    06/25/2023    9:41 AM 09/08/2022    8:47 AM 04/28/2022   10:07 AM 04/25/2021    8:49 AM 04/25/2021    8:34 AM  Fall Risk   Falls in the past year? 0 0 0 0 0  Number falls in past yr: 0 0 0 0 0  Injury with Fall? 0 0 0 0 0  Risk for fall due to : No Fall Risks No Fall Risks No Fall Risks No Fall Risks   Follow up Falls prevention discussed Falls evaluation completed   Falls evaluation completed    MEDICARE RISK AT HOME: Medicare Risk at Home Any stairs in or around the home?: No If so, are there any without handrails?: No Home free of loose throw rugs in walkways, pet beds, electrical cords, etc?: Yes Adequate lighting in your home to reduce risk of falls?: Yes Life alert?: No Use of a cane, walker or w/c?: No Grab bars in the bathroom?: No  Shower chair or bench in shower?: No Elevated toilet seat or a handicapped toilet?: No  TIMED UP AND GO:  Was the test performed?  No    Cognitive Function:    12/02/2016   10:44 AM  MMSE - Mini Mental State Exam  Not completed: --        06/25/2023    9:42 AM 04/28/2022   10:08 AM  6CIT Screen  What Year? 0 points 0 points  What month? 0 points 0 points  What time? 0 points 0 points  Count back from 20 0 points 0 points  Months in reverse 0 points 0 points  Repeat phrase 0 points 0 points  Total Score 0 points 0 points    Immunizations Immunization History  Administered Date(s)  Administered   Fluad Quad(high Dose 65+) 07/10/2019, 10/07/2020   Influenza Split 07/28/2011   Influenza Whole 07/21/2010   Influenza, High Dose Seasonal PF 08/15/2013, 07/30/2017, 08/24/2018   Influenza-Unspecified 09/28/2014, 09/20/2015, 08/19/2016, 08/28/2016   PFIZER(Purple Top)SARS-COV-2 Vaccination 11/03/2019, 11/22/2019, 07/25/2020   Pneumococcal Conjugate-13 10/25/2014   Pneumococcal Polysaccharide-23 07/28/2011   Tdap 08/20/2011    TDAP status: Due, Education has been provided regarding the importance of this vaccine. Advised may receive this vaccine at local pharmacy or Health Dept. Aware to provide a copy of the vaccination record if obtained from local pharmacy or Health Dept. Verbalized acceptance and understanding.  Flu Vaccine status: Due, Education has been provided regarding the importance of this vaccine. Advised may receive this vaccine at local pharmacy or Health Dept. Aware to provide a copy of the vaccination record if obtained from local pharmacy or Health Dept. Verbalized acceptance and understanding.  Pneumococcal vaccine status: Up to date  Covid-19 vaccine status: Declined, Education has been provided regarding the importance of this vaccine but patient still declined. Advised may receive this vaccine at local pharmacy or Health Dept.or vaccine clinic. Aware to provide a copy of the vaccination record if obtained from local pharmacy or Health Dept. Verbalized acceptance and understanding.  Qualifies for Shingles Vaccine? Yes   Zostavax completed No   Shingrix Completed?: No.    Education has been provided regarding the importance of this vaccine. Patient has been advised to call insurance company to determine out of pocket expense if they have not yet received this vaccine. Advised may also receive vaccine at local pharmacy or Health Dept. Verbalized acceptance and understanding.  Screening Tests Health Maintenance  Topic Date Due   Hepatitis C Screening  Never  done   Colonoscopy  07/09/2020   DTaP/Tdap/Td (2 - Td or Tdap) 08/19/2021   INFLUENZA VACCINE  05/20/2023   COVID-19 Vaccine (4 - 2023-24 season) 06/20/2023   Zoster Vaccines- Shingrix (1 of 2) 09/18/2023 (Originally 02/02/1965)   Medicare Annual Wellness (AWV)  06/24/2024   Pneumonia Vaccine 28+ Years old  Completed   HPV VACCINES  Aged Out    Health Maintenance  Health Maintenance Due  Topic Date Due   Hepatitis C Screening  Never done   Colonoscopy  07/09/2020   DTaP/Tdap/Td (2 - Td or Tdap) 08/19/2021   INFLUENZA VACCINE  05/20/2023   COVID-19 Vaccine (4 - 2023-24 season) 06/20/2023    Colorectal cancer screening: Referral to GI placed Patient declined. Pt aware the office will call re: appt.  Lung Cancer Screening: (Low Dose CT Chest recommended if Age 10-80 years, 20 pack-year currently smoking OR have quit w/in 15years.) does not qualify.     Additional Screening:  Hepatitis C Screening:  does qualify; Completed Deferred  Vision Screening: Recommended annual ophthalmology exams for early detection of glaucoma and other disorders of the eye. Is the patient up to date with their annual eye exam?  Yes  Who is the provider or what is the name of the office in which the patient attends annual eye exams? Dr Cathey Endow If pt is not established with a provider, would they like to be referred to a provider to establish care? No .   Dental Screening: Recommended annual dental exams for proper oral hygiene    Community Resource Referral / Chronic Care Management:  CRR required this visit?  No   CCM required this visit?  No     Plan:     I have personally reviewed and noted the following in the patient's chart:   Medical and social history Use of alcohol, tobacco or illicit drugs  Current medications and supplements including opioid prescriptions. Patient is not currently taking opioid prescriptions. Functional ability and status Nutritional status Physical  activity Advanced directives List of other physicians Hospitalizations, surgeries, and ER visits in previous 12 months Vitals Screenings to include cognitive, depression, and falls Referrals and appointments  In addition, I have reviewed and discussed with patient certain preventive protocols, quality metrics, and best practice recommendations. A written personalized care plan for preventive services as well as general preventive health recommendations were provided to patient.     Tillie Rung, LPN   10/24/1094   After Visit Summary: (MyChart) Due to this being a telephonic visit, the after visit summary with patients personalized plan was offered to patient via MyChart   Nurse Notes: None

## 2023-07-20 ENCOUNTER — Other Ambulatory Visit: Payer: Self-pay | Admitting: Family Medicine

## 2023-07-20 DIAGNOSIS — Z7901 Long term (current) use of anticoagulants: Secondary | ICD-10-CM

## 2023-07-20 NOTE — Telephone Encounter (Signed)
Pt Last INR was 06/07/23 Last refill for Warfrin was done on 01/06/23 Please advise if ok to send refill

## 2023-07-26 DIAGNOSIS — H401112 Primary open-angle glaucoma, right eye, moderate stage: Secondary | ICD-10-CM | POA: Diagnosis not present

## 2023-08-02 ENCOUNTER — Ambulatory Visit (INDEPENDENT_AMBULATORY_CARE_PROVIDER_SITE_OTHER): Payer: PPO

## 2023-08-02 DIAGNOSIS — Z23 Encounter for immunization: Secondary | ICD-10-CM

## 2023-08-02 DIAGNOSIS — Z7901 Long term (current) use of anticoagulants: Secondary | ICD-10-CM

## 2023-08-02 LAB — POCT INR: INR: 2.9 (ref 2.0–3.0)

## 2023-08-02 NOTE — Patient Instructions (Addendum)
Pre visit review using our clinic review tool, if applicable. No additional management support is needed unless otherwise documented below in the visit note.  Continue to take 1 tablet (5mg ) daily EXCEPT for 1/2 tablet (2.5 mg) tablet on Sundays and Wednesdays.  Recheck in 8 weeks per pt request.

## 2023-08-02 NOTE — Progress Notes (Signed)
Continue to take 1 tablet (5mg ) daily EXCEPT for 1/2 tablet (2.5 mg) tablet on Sundays and Wednesdays.  Recheck in 8 weeks per pt request.  Pt requested high dose flu vaccine. Administered vaccine. Pt tolerated well.

## 2023-09-03 ENCOUNTER — Encounter: Payer: Self-pay | Admitting: Family Medicine

## 2023-09-03 DIAGNOSIS — C61 Malignant neoplasm of prostate: Secondary | ICD-10-CM

## 2023-09-03 NOTE — Telephone Encounter (Signed)
They never sent a formal request, but they mentioned it in a report from a few weeks ago. I went ahead and ordered a PSA and testosterone level

## 2023-09-03 NOTE — Telephone Encounter (Signed)
I see some medical records from [facility] have you had a chance to look at them. Please advise

## 2023-09-08 NOTE — Telephone Encounter (Signed)
Pt is aware to go to [place] lab and have the labs drawn, Labs future on epic

## 2023-09-10 ENCOUNTER — Other Ambulatory Visit (INDEPENDENT_AMBULATORY_CARE_PROVIDER_SITE_OTHER): Payer: PPO

## 2023-09-10 DIAGNOSIS — C61 Malignant neoplasm of prostate: Secondary | ICD-10-CM

## 2023-09-10 LAB — PSA: PSA: 1.32 ng/mL (ref 0.10–4.00)

## 2023-09-10 LAB — TESTOSTERONE: Testosterone: 253.91 ng/dL — ABNORMAL LOW (ref 300.00–890.00)

## 2023-09-15 ENCOUNTER — Telehealth: Payer: Self-pay | Admitting: Family Medicine

## 2023-09-15 NOTE — Telephone Encounter (Signed)
Returning call.

## 2023-09-20 NOTE — Telephone Encounter (Signed)
Spoke to pt. Please see lab result note.

## 2023-09-27 ENCOUNTER — Ambulatory Visit (INDEPENDENT_AMBULATORY_CARE_PROVIDER_SITE_OTHER): Payer: PPO

## 2023-09-27 DIAGNOSIS — Z7901 Long term (current) use of anticoagulants: Secondary | ICD-10-CM

## 2023-09-27 LAB — POCT INR: INR: 3 (ref 2.0–3.0)

## 2023-09-27 NOTE — Progress Notes (Signed)
Continue to take 1 tablet (5mg) daily EXCEPT for 1/2 tablet (2.5 mg) tablet on Sundays and Wednesdays.  Recheck in 8 weeks per pt request. 

## 2023-09-27 NOTE — Patient Instructions (Addendum)
Pre visit review using our clinic review tool, if applicable. No additional management support is needed unless otherwise documented below in the visit note.  Continue to take 1 tablet (5mg ) daily EXCEPT for 1/2 tablet (2.5 mg) tablet on Sundays and Wednesdays.  Recheck in 8 weeks per pt request.

## 2023-09-28 DIAGNOSIS — D485 Neoplasm of uncertain behavior of skin: Secondary | ICD-10-CM | POA: Diagnosis not present

## 2023-09-28 DIAGNOSIS — L218 Other seborrheic dermatitis: Secondary | ICD-10-CM | POA: Diagnosis not present

## 2023-09-28 DIAGNOSIS — L570 Actinic keratosis: Secondary | ICD-10-CM | POA: Diagnosis not present

## 2023-09-28 DIAGNOSIS — L821 Other seborrheic keratosis: Secondary | ICD-10-CM | POA: Diagnosis not present

## 2023-09-28 DIAGNOSIS — L817 Pigmented purpuric dermatosis: Secondary | ICD-10-CM | POA: Diagnosis not present

## 2023-09-28 DIAGNOSIS — Z85820 Personal history of malignant melanoma of skin: Secondary | ICD-10-CM | POA: Diagnosis not present

## 2023-09-28 DIAGNOSIS — D225 Melanocytic nevi of trunk: Secondary | ICD-10-CM | POA: Diagnosis not present

## 2023-09-28 DIAGNOSIS — Z85828 Personal history of other malignant neoplasm of skin: Secondary | ICD-10-CM | POA: Diagnosis not present

## 2023-09-28 DIAGNOSIS — L820 Inflamed seborrheic keratosis: Secondary | ICD-10-CM | POA: Diagnosis not present

## 2023-09-28 DIAGNOSIS — D0362 Melanoma in situ of left upper limb, including shoulder: Secondary | ICD-10-CM | POA: Diagnosis not present

## 2023-09-28 DIAGNOSIS — L905 Scar conditions and fibrosis of skin: Secondary | ICD-10-CM | POA: Diagnosis not present

## 2023-10-26 DIAGNOSIS — H401132 Primary open-angle glaucoma, bilateral, moderate stage: Secondary | ICD-10-CM | POA: Diagnosis not present

## 2023-10-26 DIAGNOSIS — H2513 Age-related nuclear cataract, bilateral: Secondary | ICD-10-CM | POA: Diagnosis not present

## 2023-11-09 DIAGNOSIS — D0362 Melanoma in situ of left upper limb, including shoulder: Secondary | ICD-10-CM | POA: Diagnosis not present

## 2023-11-09 DIAGNOSIS — Z85828 Personal history of other malignant neoplasm of skin: Secondary | ICD-10-CM | POA: Diagnosis not present

## 2023-11-09 DIAGNOSIS — Z85820 Personal history of malignant melanoma of skin: Secondary | ICD-10-CM | POA: Diagnosis not present

## 2023-11-11 DIAGNOSIS — H2513 Age-related nuclear cataract, bilateral: Secondary | ICD-10-CM | POA: Diagnosis not present

## 2023-11-11 DIAGNOSIS — H04123 Dry eye syndrome of bilateral lacrimal glands: Secondary | ICD-10-CM | POA: Diagnosis not present

## 2023-11-15 DIAGNOSIS — C775 Secondary and unspecified malignant neoplasm of intrapelvic lymph nodes: Secondary | ICD-10-CM | POA: Diagnosis not present

## 2023-11-15 DIAGNOSIS — Z79818 Long term (current) use of other agents affecting estrogen receptors and estrogen levels: Secondary | ICD-10-CM | POA: Diagnosis not present

## 2023-11-15 DIAGNOSIS — C61 Malignant neoplasm of prostate: Secondary | ICD-10-CM | POA: Diagnosis not present

## 2023-11-22 ENCOUNTER — Ambulatory Visit (INDEPENDENT_AMBULATORY_CARE_PROVIDER_SITE_OTHER): Payer: PPO

## 2023-11-22 DIAGNOSIS — Z7901 Long term (current) use of anticoagulants: Secondary | ICD-10-CM | POA: Diagnosis not present

## 2023-11-22 LAB — POCT INR: INR: 3.5 — AB (ref 2.0–3.0)

## 2023-11-22 NOTE — Patient Instructions (Addendum)
Pre visit review using our clinic review tool, if applicable. No additional management support is needed unless otherwise documented below in the visit note.  Hold dose tomorrow and then change weekly dose to take 1 tablet (5mg ) daily EXCEPT for 1/2 tablet (2.5 mg) tablet on Sundays, Wednesdays and Fridays.  Recheck in 3 weeks.

## 2023-11-22 NOTE — Progress Notes (Signed)
Pt reports missing a dose one week ago due to being out of town. Pt also reports he had surgery on his arm two weeks ago, pt denies any problems with the surgery. Pt also reports he has been taking doxycycline for a long time, but normally only takes 50 mg every day (even though it is prescribed BID). He reports he has always been told to take it twice daily if needed. He reports he has taken it BID a few times in the last week. Doxycycline interacts with warfarin and will increase risk of bleeding. Pt denies any bleeding. Advised if any s/s of bleeding or abnormal bruising to go to the ER or contact the office. Pt verbalized understanding. Pt denies any other changes.  Pt already took warfarin today. Hold dose tomorrow and then change weekly dose to take 1 tablet (5mg ) daily EXCEPT for 1/2 tablet (2.5 mg) tablet on Sundays, Wednesdays and Fridays.  Recheck in 3 weeks.

## 2023-12-02 DIAGNOSIS — H2513 Age-related nuclear cataract, bilateral: Secondary | ICD-10-CM | POA: Diagnosis not present

## 2023-12-13 ENCOUNTER — Ambulatory Visit (INDEPENDENT_AMBULATORY_CARE_PROVIDER_SITE_OTHER): Payer: PPO

## 2023-12-13 DIAGNOSIS — Z7901 Long term (current) use of anticoagulants: Secondary | ICD-10-CM | POA: Diagnosis not present

## 2023-12-13 LAB — POCT INR: INR: 2.1 (ref 2.0–3.0)

## 2023-12-13 NOTE — Progress Notes (Signed)
 Continue 1 tablet (5mg ) daily EXCEPT for 1/2 tablet (2.5 mg) tablet on Sundays, Wednesdays and Fridays.  Recheck in 6 weeks.

## 2023-12-13 NOTE — Patient Instructions (Addendum)
 Pre visit review using our clinic review tool, if applicable. No additional management support is needed unless otherwise documented below in the visit note.  Continue 1 tablet (5mg ) daily EXCEPT for 1/2 tablet (2.5 mg) tablet on Sundays, Wednesdays and Fridays.  Recheck in 6 weeks.

## 2023-12-15 ENCOUNTER — Encounter: Payer: Self-pay | Admitting: Family Medicine

## 2023-12-16 NOTE — Telephone Encounter (Signed)
 Yes I would be glad to see her. Have her call to schedule an OV (tell them that I have accepted her as a new patient). I'm sure I could see her early next week

## 2023-12-22 DIAGNOSIS — H25811 Combined forms of age-related cataract, right eye: Secondary | ICD-10-CM | POA: Diagnosis not present

## 2023-12-22 DIAGNOSIS — H2511 Age-related nuclear cataract, right eye: Secondary | ICD-10-CM | POA: Diagnosis not present

## 2024-01-05 DIAGNOSIS — H2512 Age-related nuclear cataract, left eye: Secondary | ICD-10-CM | POA: Diagnosis not present

## 2024-01-24 ENCOUNTER — Ambulatory Visit (INDEPENDENT_AMBULATORY_CARE_PROVIDER_SITE_OTHER): Payer: PPO

## 2024-01-24 DIAGNOSIS — Z7901 Long term (current) use of anticoagulants: Secondary | ICD-10-CM | POA: Diagnosis not present

## 2024-01-24 LAB — POCT INR: INR: 3.1 — AB (ref 2.0–3.0)

## 2024-01-24 NOTE — Patient Instructions (Addendum)
 Pre visit review using our clinic review tool, if applicable. No additional management support is needed unless otherwise documented below in the visit note.  Reduce dose today to take 1/2 tablet (2.5 mg) and then continue 1 tablet (5mg ) daily EXCEPT for 1/2 tablet (2.5 mg) tablet on Sundays, Wednesdays and Fridays.  Recheck in 6 weeks.

## 2024-01-24 NOTE — Progress Notes (Signed)
 Pt took a full tablet yesterday by mistake instead of a 1/2 tablet. Reduce dose today to take 1/2 tablet (2.5 mg) and then continue 1 tablet (5mg ) daily EXCEPT for 1/2 tablet (2.5 mg) tablet on Sundays, Wednesdays and Fridays.  Recheck in 6 weeks per pt request.

## 2024-02-16 DIAGNOSIS — C775 Secondary and unspecified malignant neoplasm of intrapelvic lymph nodes: Secondary | ICD-10-CM | POA: Diagnosis not present

## 2024-02-16 DIAGNOSIS — C61 Malignant neoplasm of prostate: Secondary | ICD-10-CM | POA: Diagnosis not present

## 2024-03-08 ENCOUNTER — Ambulatory Visit (INDEPENDENT_AMBULATORY_CARE_PROVIDER_SITE_OTHER)

## 2024-03-08 DIAGNOSIS — Z7901 Long term (current) use of anticoagulants: Secondary | ICD-10-CM

## 2024-03-08 LAB — POCT INR: INR: 2.3 (ref 2.0–3.0)

## 2024-03-08 NOTE — Progress Notes (Signed)
 Continue 1 tablet (5mg ) daily EXCEPT for 1/2 tablet (2.5 mg) tablet on Sundays, Wednesdays and Fridays.  Recheck in 6 weeks.

## 2024-03-08 NOTE — Patient Instructions (Addendum)
 Pre visit review using our clinic review tool, if applicable. No additional management support is needed unless otherwise documented below in the visit note.  Continue 1 tablet (5mg ) daily EXCEPT for 1/2 tablet (2.5 mg) tablet on Sundays, Wednesdays and Fridays.  Recheck in 6 weeks.

## 2024-03-16 ENCOUNTER — Encounter: Payer: Self-pay | Admitting: Family Medicine

## 2024-03-18 ENCOUNTER — Other Ambulatory Visit: Payer: Self-pay | Admitting: Family Medicine

## 2024-03-18 DIAGNOSIS — Z7901 Long term (current) use of anticoagulants: Secondary | ICD-10-CM

## 2024-03-20 NOTE — Telephone Encounter (Signed)
 Yes Gabapentin  could help with this pain. I will send in a RX for her

## 2024-03-20 NOTE — Telephone Encounter (Signed)
 Pt spouse notified to pick Rx from the pharmacy

## 2024-03-21 DIAGNOSIS — C61 Malignant neoplasm of prostate: Secondary | ICD-10-CM | POA: Diagnosis not present

## 2024-03-21 DIAGNOSIS — Z9079 Acquired absence of other genital organ(s): Secondary | ICD-10-CM | POA: Diagnosis not present

## 2024-03-21 DIAGNOSIS — C775 Secondary and unspecified malignant neoplasm of intrapelvic lymph nodes: Secondary | ICD-10-CM | POA: Diagnosis not present

## 2024-03-21 DIAGNOSIS — R590 Localized enlarged lymph nodes: Secondary | ICD-10-CM | POA: Diagnosis not present

## 2024-03-27 NOTE — Progress Notes (Signed)
 Radiation Oncology Consultation Note  This outpatient service will use MDM for billing purposes.  The MDM is High.  Complexity of Problem-High, disease poses a threat to life Complexity of Data to be reviewed and analyzed-Extensive, review of prior notes, independent history obtained, review of results of multiple tests and independent interpretation of tests Risk of complications/morbidity-Moderate to High  MRN: [id number] DOB: 28-Sep-1946 Physician Requesting Consultation: Shellia Selinda Kotyk,* Reason for Consultation: Metastatic prostate cancer Today at the request of Dr Shellia, Selinda Kotyk IMUS I see Donald Villegas in consultation for metastatic prostate cancer. Donald Villegas [id number] is a 78 y.o. male from [place] [address]   Prostate cancer history- 78 y.o. male who was+ originally diagnosed with prostate cancer in 2010 with an elevated PSA and underwent RALP at [facility] for Gleason 7 cancer.  His pathology revealed organ confined disease with a positive margin.   His PSA remained undetectable for 4 years until 2015 when it rose to 0.4 and he was treated with salvage radiation therapy alone.   IMRT 66 Gy to prostate bed 12/14/13-01/31/24 with Dr Jason in [place] Second biochemical recurrence evaluated by Dr Zachary 08/29/18. PSA 17.7 CT A/P presacral and perirectal nodes; started ADT. ADT stopped in 2021; T normalized. Most recent PSA trend below Lab Results  Component Value Date   TOTALPSA 2.05 02/16/2024   TOTALPSA 1.60 11/15/2023   TOTALPSA 0.47 05/12/2023   PSMA PET 03/21/24 IMPRESSION: 1.  Intensely tracer avid and enlarged right internal iliac lymph node, concerning for PSMA+ nodal metastasis. No additional sites of tracer avid adenopathy. 2.  Prostatectomy. No evidence of PSMA+ locally recurrent or visceral/osseous metastatic disease.    Areas to be treated- Pelvic LN + on PSMA Pain Medications       Disp Refills Start End   gabapentin  (NEURONTIN ) 100 MG capsule -- -- 11/03/2021  --   Sig - Route: Take 100 mg by mouth 3 (three) times a day - Oral   Class: Historical Med        I see him to discuss treatment options. REVIEW OF SYSTEMS:  General ROS: A review of systems was performed and pertinent positives and negatives were documented in the above HPI; all other systems are negative.    PROBLEM LIST:  Patient Active Problem List  Diagnosis  . Prostate cancer metastatic to intrapelvic lymph node (CMS/HHS-HCC)   Past Medical History:  Past Medical History:  Diagnosis Date  . Asthma without status asthmaticus (HHS-HCC)   . Clotting disorder (CMS/HHS-HCC)   . Coronary artery disease   . GERD (gastroesophageal reflux disease)   . Glaucoma   . Melanoma in situ (CMS/HHS-HCC)   . PE (pulmonary thromboembolism) (CMS/HHS-HCC)   . Prostate cancer (CMS/HHS-HCC)    Past Surgical History:  Past Surgical History:  Procedure Laterality Date  . HERNIA REPAIR    . KNEE ARTHROSCOPY Right   . LAPAROSCOPIC PROSTATECTOMY ROBOTIC     History of Connective Tissue Disorder: No History of Inflammatory Bowel Disease: No History of Prior Radiation Therapy: Yes, prostate bed 2015 Pacemaker: No  Current Medications:  Current Outpatient Medications  Medication Sig Dispense Refill  . carboxymethylcellulose (REFRESH PLUS) 0.5 % ophthalmic solution Place 1 drop into both eyes 3 (three) times daily as needed    . doxycycline  (MONODOX ) 50 MG capsule Take 50 mg by mouth 2 (two) times daily    . famotidine  (PEPCID ) 20 MG tablet Take 20 mg by mouth 2 (two) times daily    . latanoprost (XALATAN) 0.005 %  ophthalmic solution Place 1 drop into both eyes at bedtime    . valACYclovir (VALTREX) 1000 MG tablet Take 1,000 mg by mouth once daily    . warfarin (COUMADIN ) 5 MG tablet Take 5 mg by mouth as directed 5 mg on Sun & Wed, 10 mg Mon,Tue, Thurs, Fri & Sat      . gabapentin  (NEURONTIN ) 100 MG capsule Take 100 mg by mouth 3 (three) times a day    . loteprednol (LOTEMAX) 0.5 %  ophthalmic suspension 1 drop in both eyes 1-2 times a day as needed for comfort (Patient not taking: Reported on 05/04/2022) 5 mL 2  . neomycin-polymyxin-dexAMETHasone  (MAXITROL) ophthalmic ointment Place into both eyes at bedtime (Patient not taking: Reported on 03/28/2024) 3.5 g 3  . sildenafil  (VIAGRA ) 100 MG tablet Take by mouth as directed     No current facility-administered medications for this encounter.    Allergies:  No Known Allergies Social History:  Social History   Socioeconomic History  . Marital status: Married  Tobacco Use  . Smoking status: Former    Current packs/day: 0.00    Types: Cigarettes    Quit date: 10/19/1973    Years since quitting: 50.4    Passive exposure: Past  . Smokeless tobacco: Never  Vaping Use  . Vaping status: Never Used  Substance and Sexual Activity  . Alcohol use: Yes  . Drug use: Never  . Sexual activity: Defer   Social Drivers of Health   Financial Resource Strain: Low Risk  (06/25/2023)   Received from [facility]   Overall Financial Resource Strain (CARDIA)   . Difficulty of Paying Living Expenses: Not hard at all  Food Insecurity: No Food Insecurity (06/25/2023)   Received from [facility]   Hunger Vital Sign   . Within the past 12 months, you worried that your food would run out before you got the money to buy more.: Never true   . Within the past 12 months, the food you bought just didn't last and you didn't have money to get more.: Never true  Transportation Needs: No Transportation Needs (04/28/2022)   Received from [facility] - Transportation   . Lack of Transportation (Medical): No   . Lack of Transportation (Non-Medical): No  Physical Activity: Sufficiently Active (06/25/2023)   Received from [facility]   Exercise Vital Sign   . On average, how many days per week do you engage in moderate to strenuous exercise (like a brisk walk)?: 7 days   . On average, how many minutes do you engage in exercise at this level?: 60  min  Stress: No Stress Concern Present (06/25/2023)   Received from [facility] - Occupational Stress Questionnaire   . Feeling of Stress : Not at all  Social Connections: Socially Integrated (06/25/2023)   Received from [facility]   Social Connection and Isolation Panel   . In a typical week, how many times do you talk on the phone with family, friends, or neighbors?: More than three times a week   . How often do you get together with friends or relatives?: More than three times a week   . How often do you attend church or religious services?: More than 4 times per year   . Do you belong to any clubs or organizations such as church groups, unions, fraternal or athletic groups, or school groups?: Yes   . How often do you attend meetings  of the clubs or organizations you belong to?: More than 4 times per year   . Are you married, widowed, divorced, separated, never married, or living with a partner?: Married  Housing Stability: Unknown (11/10/2023)   Housing Stability Vital Sign   . Homeless in the Last Year: No   Family History:  Family History  Problem Relation Name Age of Onset  . Prostate cancer Paternal Grandfather    . Diabetes Neg Hx    . Diabetes type I Neg Hx    . Diabetes type II Neg Hx    . Coronary Artery Disease (Blocked arteries around heart) Neg Hx    . Glaucoma Neg Hx     Physical Exam BP (!) 148/86 (BP Location: Left upper arm, Patient Position: Sitting, BP Cuff Size: Adult)   Pulse 53   Temp 36.4 C (97.6 F) (Oral)   Resp 18   Wt 80.7 kg (177 lb 14.6 oz)   SpO2 96%   BMI 25.53 kg/m Body surface area is 2 meters squared.Body mass index is 25.53 kg/m.   General: alert, cooperative, in NAD HEENT: no scleral icterus Musculoskeletal: moves all extremities, ambulates without difficulty  Extremities: no peripheral edema Neurologic: Grossly normal  Psych: oriented to time, place and person, mood and affect are  appropriate DRE not performed at patient request  Assessment:  Oligorecurrent prostate cancer  Plan:  Retrieve radiation records from [place].  Patient prefers to avoid ADT.  SBRT to PSMA+ LN. Plan 3-5 fractions. I will arrange simulation ASAP.      Attestation Statement:   I personally performed the service. (TP)  Elsie Lamar Ruth, MD   Elsie Lamar Ruth, MD 03/28/2024 10:58 AM

## 2024-03-28 DIAGNOSIS — C61 Malignant neoplasm of prostate: Secondary | ICD-10-CM | POA: Diagnosis not present

## 2024-03-28 DIAGNOSIS — C775 Secondary and unspecified malignant neoplasm of intrapelvic lymph nodes: Secondary | ICD-10-CM | POA: Diagnosis not present

## 2024-03-29 DIAGNOSIS — L905 Scar conditions and fibrosis of skin: Secondary | ICD-10-CM | POA: Diagnosis not present

## 2024-03-29 DIAGNOSIS — D225 Melanocytic nevi of trunk: Secondary | ICD-10-CM | POA: Diagnosis not present

## 2024-03-29 DIAGNOSIS — Z85828 Personal history of other malignant neoplasm of skin: Secondary | ICD-10-CM | POA: Diagnosis not present

## 2024-03-29 DIAGNOSIS — Z85820 Personal history of malignant melanoma of skin: Secondary | ICD-10-CM | POA: Diagnosis not present

## 2024-03-29 DIAGNOSIS — L821 Other seborrheic keratosis: Secondary | ICD-10-CM | POA: Diagnosis not present

## 2024-03-29 DIAGNOSIS — L570 Actinic keratosis: Secondary | ICD-10-CM | POA: Diagnosis not present

## 2024-04-10 DIAGNOSIS — C775 Secondary and unspecified malignant neoplasm of intrapelvic lymph nodes: Secondary | ICD-10-CM | POA: Diagnosis not present

## 2024-04-10 DIAGNOSIS — C61 Malignant neoplasm of prostate: Secondary | ICD-10-CM | POA: Diagnosis not present

## 2024-04-10 NOTE — Progress Notes (Signed)
 Radiation Oncology Simulation Note  History of Connective Tissue Disorder: No History of Inflammatory Bowel Disease: No History of Prior Radiation Therapy: Yes, prostate bed 2016 Pacemaker: No Hip Replacement: No  Diagnosis: Prostate Cancer PSMA+ LN   Treatment Plan: SBRT to PSMA+LN  Procedure: Scharlene Munroe is simulated supine.  Serial axial CT images are obtained of the abdomen and pelvis for treatment planning. Bladder is sufficiently EMPTY and rectum is sufficiently empty.   CBCT for daily IGRT  I approved the images for treatment planning.   Attestation Statement:   I personally performed the service. (TP)  Elsie Lamar Ruth, MD  Elsie Lamar Ruth, MD 04/10/2024 1:51 PM

## 2024-04-17 ENCOUNTER — Ambulatory Visit (INDEPENDENT_AMBULATORY_CARE_PROVIDER_SITE_OTHER)

## 2024-04-17 DIAGNOSIS — Z7901 Long term (current) use of anticoagulants: Secondary | ICD-10-CM | POA: Diagnosis not present

## 2024-04-17 LAB — POCT INR: INR: 2.3 (ref 2.0–3.0)

## 2024-04-17 NOTE — Progress Notes (Signed)
 Continue 1 tablet (5mg ) daily EXCEPT for 1/2 tablet (2.5 mg) tablet on Sundays, Wednesdays and Fridays.  Recheck in 8 weeks.

## 2024-04-17 NOTE — Patient Instructions (Addendum)
 Pre visit review using our clinic review tool, if applicable. No additional management support is needed unless otherwise documented below in the visit note.  Continue 1 tablet (5mg ) daily EXCEPT for 1/2 tablet (2.5 mg) tablet on Sundays, Wednesdays and Fridays.  Recheck in 8 weeks.

## 2024-04-25 DIAGNOSIS — C61 Malignant neoplasm of prostate: Secondary | ICD-10-CM | POA: Diagnosis not present

## 2024-04-25 DIAGNOSIS — C775 Secondary and unspecified malignant neoplasm of intrapelvic lymph nodes: Secondary | ICD-10-CM | POA: Diagnosis not present

## 2024-04-25 NOTE — Progress Notes (Signed)
 Radiation Oncology Simulation Note   Diagnosis: Oligometastatic prostate cancer undergoing stereotactic body radiation therapy (SBRT).  The patient was brought to the CT/Sim suite. He was immobilized supine in a customized full body immobilization device and a CT scan was obtained for radiation treatment planning.    The patient tolerated the procedure well. I approved the images for radiation treatment planning.   Bernardino Harp, MD, PhD  Treatment Planning/Contouring Note   The primary tumor site is GU. This patient 's metastatic presentation at the time of RT is metachronous (metastatic disease more than 6 months after primary cancer diagnosis disease. Ablative radiotherapy is intended to treat the oligorecurrent state.   A CT simulation was performed in the immobilization device.  The CT images were fused to PSMA-PET scan to help delineate the target volume.  The clinical target volume (CTV) was defined as PET-AVID or radiographically evident disease. An expansion of 3-68mm from the CTV was generated to define the planning target volume (PTV).  Additionally, prior radiation records were reviewed to ensure safe planning and minimize overlap with previous treatment fields.  An intensity-modulated technique using multiple volumetric modulated arc therapy (VMAT)  will be used to deliver dose to the target and minimize dose to surrounding critical structures.  A dose of 35 Gy in five fractions will be delivered to the target.  Treatment planning objectives to maximize dose to the target and achieve safe doses to the critical structures were conveyed to the treatment planning staff. Bernardino Harp, MD, PhD [facility] of Radiation Oncology  Radiation Oncology Plan Approval Note  DIAGNOSIS: Oligorecurrent prostate Cancer  PROCEDURE: Today I reviewed and approved an SBRT plan for Donald Villegas.  I have chosen this plan because all of the normal tissue constraints are met and PTV coverage is  sufficient. The patient will begin treatment in the near future.     Attestation Statement:   I personally performed the service. (TP)  BERNARDINO GUILLERMINA HARP, MD  BERNARDINO GUILLERMINA HARP, MD 04/25/2024 8:53 AM

## 2024-04-26 DIAGNOSIS — C775 Secondary and unspecified malignant neoplasm of intrapelvic lymph nodes: Secondary | ICD-10-CM | POA: Diagnosis not present

## 2024-04-26 DIAGNOSIS — C61 Malignant neoplasm of prostate: Secondary | ICD-10-CM | POA: Diagnosis not present

## 2024-04-27 DIAGNOSIS — C61 Malignant neoplasm of prostate: Secondary | ICD-10-CM | POA: Diagnosis not present

## 2024-04-27 DIAGNOSIS — C775 Secondary and unspecified malignant neoplasm of intrapelvic lymph nodes: Secondary | ICD-10-CM | POA: Diagnosis not present

## 2024-04-28 DIAGNOSIS — C61 Malignant neoplasm of prostate: Secondary | ICD-10-CM | POA: Diagnosis not present

## 2024-04-28 DIAGNOSIS — C775 Secondary and unspecified malignant neoplasm of intrapelvic lymph nodes: Secondary | ICD-10-CM | POA: Diagnosis not present

## 2024-05-01 DIAGNOSIS — C61 Malignant neoplasm of prostate: Secondary | ICD-10-CM | POA: Diagnosis not present

## 2024-05-01 DIAGNOSIS — C775 Secondary and unspecified malignant neoplasm of intrapelvic lymph nodes: Secondary | ICD-10-CM | POA: Diagnosis not present

## 2024-05-02 DIAGNOSIS — C775 Secondary and unspecified malignant neoplasm of intrapelvic lymph nodes: Secondary | ICD-10-CM | POA: Diagnosis not present

## 2024-05-02 DIAGNOSIS — C61 Malignant neoplasm of prostate: Secondary | ICD-10-CM | POA: Diagnosis not present

## 2024-05-12 DIAGNOSIS — H04123 Dry eye syndrome of bilateral lacrimal glands: Secondary | ICD-10-CM | POA: Diagnosis not present

## 2024-05-12 DIAGNOSIS — H401131 Primary open-angle glaucoma, bilateral, mild stage: Secondary | ICD-10-CM | POA: Diagnosis not present

## 2024-05-17 DIAGNOSIS — C61 Malignant neoplasm of prostate: Secondary | ICD-10-CM | POA: Diagnosis not present

## 2024-05-17 DIAGNOSIS — C775 Secondary and unspecified malignant neoplasm of intrapelvic lymph nodes: Secondary | ICD-10-CM | POA: Diagnosis not present

## 2024-06-12 ENCOUNTER — Ambulatory Visit (INDEPENDENT_AMBULATORY_CARE_PROVIDER_SITE_OTHER)

## 2024-06-12 DIAGNOSIS — Z7901 Long term (current) use of anticoagulants: Secondary | ICD-10-CM | POA: Diagnosis not present

## 2024-06-12 LAB — POCT INR: INR: 2.4 (ref 2.0–3.0)

## 2024-06-12 NOTE — Progress Notes (Signed)
 Continue 1 tablet (5mg ) daily EXCEPT for 1/2 tablet (2.5 mg) tablet on Sundays, Wednesdays and Fridays.  Recheck in 8 weeks per pt request.

## 2024-06-12 NOTE — Patient Instructions (Addendum)
 Pre visit review using our clinic review tool, if applicable. No additional management support is needed unless otherwise documented below in the visit note.  Continue 1 tablet (5mg ) daily EXCEPT for 1/2 tablet (2.5 mg) tablet on Sundays, Wednesdays and Fridays.  Recheck in 8 weeks.

## 2024-08-07 ENCOUNTER — Ambulatory Visit (INDEPENDENT_AMBULATORY_CARE_PROVIDER_SITE_OTHER)

## 2024-08-07 DIAGNOSIS — Z7901 Long term (current) use of anticoagulants: Secondary | ICD-10-CM

## 2024-08-07 LAB — POCT INR: INR: 1.9 — AB (ref 2.0–3.0)

## 2024-08-07 MED ORDER — WARFARIN SODIUM 5 MG PO TABS: ORAL_TABLET | ORAL | 1 refills | Status: AC

## 2024-08-07 NOTE — Progress Notes (Signed)
 Increase dose today to take 1 1/2 tablets and then continue 1 tablet (5mg ) daily EXCEPT for 1/2 tablet (2.5 mg) tablet on Sundays, Wednesdays and Fridays.  Recheck in 8 weeks per pt request.

## 2024-08-07 NOTE — Patient Instructions (Addendum)
 Pre visit review using our clinic review tool, if applicable. No additional management support is needed unless otherwise documented below in the visit note.  Increase dose today to take 1 1/2 tablets and then continue 1 tablet (5mg ) daily EXCEPT for 1/2 tablet (2.5 mg) tablet on Sundays, Wednesdays and Fridays.  Recheck in 8 weeks per pt request.

## 2024-08-16 DIAGNOSIS — C61 Malignant neoplasm of prostate: Secondary | ICD-10-CM | POA: Diagnosis not present

## 2024-08-16 DIAGNOSIS — C775 Secondary and unspecified malignant neoplasm of intrapelvic lymph nodes: Secondary | ICD-10-CM | POA: Diagnosis not present

## 2024-09-25 ENCOUNTER — Ambulatory Visit

## 2024-09-25 DIAGNOSIS — Z7901 Long term (current) use of anticoagulants: Secondary | ICD-10-CM

## 2024-09-25 LAB — POCT INR: INR: 1.8 — AB (ref 2.0–3.0)

## 2024-09-25 NOTE — Patient Instructions (Addendum)
 Pre visit review using our clinic review tool, if applicable. No additional management support is needed unless otherwise documented below in the visit note.  Increase dose today to take 1 1/2 tablets and then change weekly dose to take 1 tablet daily except take 1/2 tablet on Monday and Friday.  Recheck in 4 weeks.

## 2024-09-25 NOTE — Progress Notes (Signed)
 Indication: PE Pt denies any changes.  Increase dose today to take 1 1/2 tablets and then change weekly dose to take 1 tablet daily except take 1/2 tablet on Monday and Friday.  Recheck in 4 weeks.

## 2024-09-26 DIAGNOSIS — Z85828 Personal history of other malignant neoplasm of skin: Secondary | ICD-10-CM | POA: Diagnosis not present

## 2024-09-26 DIAGNOSIS — L905 Scar conditions and fibrosis of skin: Secondary | ICD-10-CM | POA: Diagnosis not present

## 2024-09-26 DIAGNOSIS — Z85820 Personal history of malignant melanoma of skin: Secondary | ICD-10-CM | POA: Diagnosis not present

## 2024-09-26 DIAGNOSIS — D225 Melanocytic nevi of trunk: Secondary | ICD-10-CM | POA: Diagnosis not present

## 2024-09-26 DIAGNOSIS — L821 Other seborrheic keratosis: Secondary | ICD-10-CM | POA: Diagnosis not present

## 2024-09-26 DIAGNOSIS — D2261 Melanocytic nevi of right upper limb, including shoulder: Secondary | ICD-10-CM | POA: Diagnosis not present

## 2024-09-26 DIAGNOSIS — D2262 Melanocytic nevi of left upper limb, including shoulder: Secondary | ICD-10-CM | POA: Diagnosis not present

## 2024-09-26 DIAGNOSIS — D2271 Melanocytic nevi of right lower limb, including hip: Secondary | ICD-10-CM | POA: Diagnosis not present

## 2024-09-26 DIAGNOSIS — D2272 Melanocytic nevi of left lower limb, including hip: Secondary | ICD-10-CM | POA: Diagnosis not present

## 2024-09-26 DIAGNOSIS — L570 Actinic keratosis: Secondary | ICD-10-CM | POA: Diagnosis not present

## 2024-09-27 ENCOUNTER — Ambulatory Visit: Payer: Self-pay | Admitting: Family Medicine

## 2024-09-27 ENCOUNTER — Encounter: Payer: Self-pay | Admitting: Family Medicine

## 2024-09-27 ENCOUNTER — Ambulatory Visit (INDEPENDENT_AMBULATORY_CARE_PROVIDER_SITE_OTHER): Admitting: Family Medicine

## 2024-09-27 VITALS — BP 100/70 | HR 65 | Temp 97.5°F | Ht 70.0 in | Wt 179.6 lb

## 2024-09-27 DIAGNOSIS — Z0000 Encounter for general adult medical examination without abnormal findings: Secondary | ICD-10-CM | POA: Diagnosis not present

## 2024-09-27 LAB — HEPATIC FUNCTION PANEL
ALT: 16 U/L (ref 0–53)
AST: 20 U/L (ref 0–37)
Albumin: 4.1 g/dL (ref 3.5–5.2)
Alkaline Phosphatase: 60 U/L (ref 39–117)
Bilirubin, Direct: 0.1 mg/dL (ref 0.0–0.3)
Total Bilirubin: 0.5 mg/dL (ref 0.2–1.2)
Total Protein: 6.6 g/dL (ref 6.0–8.3)

## 2024-09-27 LAB — CBC WITH DIFFERENTIAL/PLATELET
Basophils Absolute: 0 K/uL (ref 0.0–0.1)
Basophils Relative: 0.9 % (ref 0.0–3.0)
Eosinophils Absolute: 0.2 K/uL (ref 0.0–0.7)
Eosinophils Relative: 3.8 % (ref 0.0–5.0)
HCT: 47.9 % (ref 39.0–52.0)
Hemoglobin: 16.1 g/dL (ref 13.0–17.0)
Lymphocytes Relative: 23.5 % (ref 12.0–46.0)
Lymphs Abs: 1.1 K/uL (ref 0.7–4.0)
MCHC: 33.6 g/dL (ref 30.0–36.0)
MCV: 105.1 fl — ABNORMAL HIGH (ref 78.0–100.0)
Monocytes Absolute: 0.5 K/uL (ref 0.1–1.0)
Monocytes Relative: 10.3 % (ref 3.0–12.0)
Neutro Abs: 2.9 K/uL (ref 1.4–7.7)
Neutrophils Relative %: 61.5 % (ref 43.0–77.0)
Platelets: 166 K/uL (ref 150.0–400.0)
RBC: 4.56 Mil/uL (ref 4.22–5.81)
RDW: 13.2 % (ref 11.5–15.5)
WBC: 4.7 K/uL (ref 4.0–10.5)

## 2024-09-27 LAB — LIPID PANEL
Cholesterol: 158 mg/dL (ref 0–200)
HDL: 36.5 mg/dL — ABNORMAL LOW (ref >39.00)
LDL Cholesterol: 82 mg/dL (ref 0–99)
NonHDL: 121.26
Total CHOL/HDL Ratio: 4
Triglycerides: 195 mg/dL — ABNORMAL HIGH (ref 0.0–149.0)
VLDL: 39 mg/dL (ref 0.0–40.0)

## 2024-09-27 LAB — BASIC METABOLIC PANEL WITH GFR
BUN: 30 mg/dL — ABNORMAL HIGH (ref 6–23)
CO2: 27 meq/L (ref 19–32)
Calcium: 9.1 mg/dL (ref 8.4–10.5)
Chloride: 106 meq/L (ref 96–112)
Creatinine, Ser: 0.89 mg/dL (ref 0.40–1.50)
GFR: 80.85 mL/min (ref >60.00)
Glucose, Bld: 82 mg/dL (ref 70–99)
Potassium: 4.3 meq/L (ref 3.5–5.1)
Sodium: 141 meq/L (ref 135–145)

## 2024-09-27 LAB — HEMOGLOBIN A1C: Hgb A1c MFr Bld: 5.9 % (ref 4.6–6.5)

## 2024-09-27 LAB — TSH: TSH: 4.27 u[IU]/mL (ref 0.35–5.50)

## 2024-09-27 NOTE — Progress Notes (Signed)
 Subjective:    Patient ID: DEVERICK PRUSS, male    DOB: [birth year], 78 y.o.   MRN: [id number]  HPI Here for a well exam. He has no complaints. He sees [facility] every 6 months for his prostate cancer. His last PSA in October was 0.38 and his testosterone  level is in the 200's. He stopped using Lupron  in November 2020.    Review of Systems  Constitutional: Negative.   HENT: Negative.    Eyes: Negative.   Respiratory: Negative.    Cardiovascular: Negative.   Gastrointestinal: Negative.   Genitourinary: Negative.   Musculoskeletal: Negative.   Skin: Negative.   Neurological: Negative.   Psychiatric/Behavioral: Negative.         Objective:   Physical Exam Constitutional:      General: He is not in acute distress.    Appearance: Normal appearance. He is well-developed. He is not diaphoretic.  HENT:     Head: Normocephalic and atraumatic.     Right Ear: External ear normal.     Left Ear: External ear normal.     Nose: Nose normal.     Mouth/Throat:     Pharynx: No oropharyngeal exudate.  Eyes:     General: No scleral icterus.       Right eye: No discharge.        Left eye: No discharge.     Conjunctiva/sclera: Conjunctivae normal.     Pupils: Pupils are equal, round, and reactive to light.  Neck:     Thyroid : No thyromegaly.     Vascular: No JVD.     Trachea: No tracheal deviation.  Cardiovascular:     Rate and Rhythm: Normal rate and regular rhythm.     Pulses: Normal pulses.     Heart sounds: Normal heart sounds. No murmur heard.    No friction rub. No gallop.  Pulmonary:     Effort: Pulmonary effort is normal. No respiratory distress.     Breath sounds: Normal breath sounds. No wheezing or rales.  Chest:     Chest wall: No tenderness.  Abdominal:     General: Bowel sounds are normal. There is no distension.     Palpations: Abdomen is soft. There is no mass.     Tenderness: There is no abdominal tenderness. There is no guarding or rebound.  Genitourinary:     Penis: No tenderness.   Musculoskeletal:        General: No tenderness. Normal range of motion.     Cervical back: Neck supple.  Lymphadenopathy:     Cervical: No cervical adenopathy.  Skin:    General: Skin is warm and dry.     Coloration: Skin is not pale.     Findings: No erythema or rash.  Neurological:     General: No focal deficit present.     Mental Status: He is alert and oriented to person, place, and time.     Cranial Nerves: No cranial nerve deficit.     Motor: No abnormal muscle tone.     Coordination: Coordination normal.     Deep Tendon Reflexes: Reflexes are normal and symmetric. Reflexes normal.  Psychiatric:        Mood and Affect: Mood normal.        Behavior: Behavior normal.        Thought Content: Thought content normal.        Judgment: Judgment normal.           Assessment & Plan:  Well exam. We discussed diet and exercise. Get fasting labs. He will get a flu shot and a Covid vaccine at his pharmacy. Garnette Olmsted, MD

## 2024-10-23 ENCOUNTER — Ambulatory Visit

## 2024-10-23 DIAGNOSIS — Z7901 Long term (current) use of anticoagulants: Secondary | ICD-10-CM | POA: Diagnosis not present

## 2024-10-23 LAB — POCT INR: INR: 2.3 (ref 2.0–3.0)

## 2024-10-23 NOTE — Patient Instructions (Addendum)
 Pre visit review using our clinic review tool, if applicable. No additional management support is needed unless otherwise documented below in the visit note.  Continue 1 tablet daily except take 1/2 tablet on Monday and Friday.  Recheck in 8 weeks.

## 2024-10-23 NOTE — Progress Notes (Addendum)
 Indication: PE Continue 1 tablet daily except take 1/2 tablet on Monday and Friday.  Recheck in 8 weeks per pt request.

## 2024-12-18 ENCOUNTER — Ambulatory Visit
